# Patient Record
Sex: Female | Born: 1935 | Race: White | Hispanic: No | Smoking: Never smoker
Health system: Southern US, Community
[De-identification: ages and names within clinical notes are randomized; demographics above are authoritative.]

## PROBLEM LIST (undated history)

## (undated) DIAGNOSIS — K219 Gastro-esophageal reflux disease without esophagitis: Secondary | ICD-10-CM

## (undated) DIAGNOSIS — S42202A Unspecified fracture of upper end of left humerus, initial encounter for closed fracture: Secondary | ICD-10-CM

## (undated) DIAGNOSIS — Z860100 Personal history of colon polyps, unspecified: Secondary | ICD-10-CM

## (undated) DIAGNOSIS — G250 Essential tremor: Secondary | ICD-10-CM

## (undated) DIAGNOSIS — F419 Anxiety disorder, unspecified: Secondary | ICD-10-CM

## (undated) DIAGNOSIS — B019 Varicella without complication: Secondary | ICD-10-CM

## (undated) DIAGNOSIS — T7840XA Allergy, unspecified, initial encounter: Secondary | ICD-10-CM

## (undated) DIAGNOSIS — Z8719 Personal history of other diseases of the digestive system: Secondary | ICD-10-CM

## (undated) DIAGNOSIS — M1990 Unspecified osteoarthritis, unspecified site: Secondary | ICD-10-CM

## (undated) HISTORY — DX: Personal history of colonic polyps: Z86.010

## (undated) HISTORY — PX: CATARACT EXTRACTION, BILATERAL: SHX1313

## (undated) HISTORY — DX: Unspecified osteoarthritis, unspecified site: M19.90

## (undated) HISTORY — DX: Anxiety disorder, unspecified: F41.9

## (undated) HISTORY — PX: COLONOSCOPY W/ POLYPECTOMY: SHX1380

## (undated) HISTORY — PX: DILATION AND CURETTAGE OF UTERUS: SHX78

## (undated) HISTORY — PX: OTHER SURGICAL HISTORY: SHX169

## (undated) HISTORY — DX: Varicella without complication: B01.9

## (undated) HISTORY — DX: Allergy, unspecified, initial encounter: T78.40XA

## (undated) HISTORY — PX: JOINT REPLACEMENT: SHX530

## (undated) HISTORY — DX: Personal history of colon polyps, unspecified: Z86.0100

---

## 2000-07-17 HISTORY — PX: OTHER SURGICAL HISTORY: SHX169

## 2001-08-15 ENCOUNTER — Encounter: Payer: Self-pay | Admitting: Internal Medicine

## 2004-01-15 ENCOUNTER — Encounter: Admission: RE | Admit: 2004-01-15 | Discharge: 2004-01-15 | Payer: Self-pay | Admitting: Internal Medicine

## 2004-02-19 ENCOUNTER — Encounter: Payer: Self-pay | Admitting: Internal Medicine

## 2004-06-07 ENCOUNTER — Ambulatory Visit: Payer: Self-pay | Admitting: Internal Medicine

## 2004-09-09 ENCOUNTER — Ambulatory Visit: Payer: Self-pay | Admitting: Internal Medicine

## 2004-11-18 ENCOUNTER — Other Ambulatory Visit: Admission: RE | Admit: 2004-11-18 | Discharge: 2004-11-18 | Payer: Self-pay | Admitting: Neurosurgery

## 2004-11-18 ENCOUNTER — Ambulatory Visit: Payer: Self-pay | Admitting: Internal Medicine

## 2004-11-25 ENCOUNTER — Ambulatory Visit (HOSPITAL_COMMUNITY): Admission: RE | Admit: 2004-11-25 | Discharge: 2004-11-25 | Payer: Self-pay | Admitting: Internal Medicine

## 2005-03-09 ENCOUNTER — Ambulatory Visit: Payer: Self-pay | Admitting: Internal Medicine

## 2005-04-28 ENCOUNTER — Ambulatory Visit: Payer: Self-pay | Admitting: Internal Medicine

## 2006-02-02 ENCOUNTER — Ambulatory Visit: Payer: Self-pay | Admitting: Internal Medicine

## 2006-02-28 ENCOUNTER — Ambulatory Visit: Payer: Self-pay | Admitting: Internal Medicine

## 2006-04-16 ENCOUNTER — Ambulatory Visit: Payer: Self-pay | Admitting: Internal Medicine

## 2006-09-20 ENCOUNTER — Ambulatory Visit: Payer: Self-pay | Admitting: Internal Medicine

## 2006-12-19 ENCOUNTER — Other Ambulatory Visit: Admission: RE | Admit: 2006-12-19 | Discharge: 2006-12-19 | Payer: Self-pay | Admitting: Gynecology

## 2007-02-11 DIAGNOSIS — M1990 Unspecified osteoarthritis, unspecified site: Secondary | ICD-10-CM | POA: Insufficient documentation

## 2007-02-11 DIAGNOSIS — G250 Essential tremor: Secondary | ICD-10-CM

## 2007-02-11 DIAGNOSIS — M19041 Primary osteoarthritis, right hand: Secondary | ICD-10-CM | POA: Insufficient documentation

## 2007-02-13 ENCOUNTER — Ambulatory Visit: Payer: Self-pay | Admitting: Internal Medicine

## 2007-02-13 DIAGNOSIS — F329 Major depressive disorder, single episode, unspecified: Secondary | ICD-10-CM | POA: Insufficient documentation

## 2007-03-08 ENCOUNTER — Ambulatory Visit: Payer: Self-pay | Admitting: Gastroenterology

## 2007-03-20 ENCOUNTER — Encounter: Payer: Self-pay | Admitting: Internal Medicine

## 2007-03-20 ENCOUNTER — Ambulatory Visit: Payer: Self-pay | Admitting: Gastroenterology

## 2007-03-20 ENCOUNTER — Encounter: Payer: Self-pay | Admitting: Gastroenterology

## 2007-04-24 ENCOUNTER — Ambulatory Visit: Payer: Self-pay | Admitting: Gastroenterology

## 2007-04-24 LAB — CONVERTED CEMR LAB
ALT: 62 units/L — ABNORMAL HIGH (ref 0–35)
BUN: 15 mg/dL (ref 6–23)
Basophils Absolute: 0.3 10*3/uL — ABNORMAL HIGH (ref 0.0–0.1)
Basophils Relative: 5 % — ABNORMAL HIGH (ref 0.0–1.0)
CO2: 28 meq/L (ref 19–32)
Calcium: 9.1 mg/dL (ref 8.4–10.5)
Chloride: 110 meq/L (ref 96–112)
GFR calc non Af Amer: 58 mL/min
Glucose, Bld: 103 mg/dL — ABNORMAL HIGH (ref 70–99)
Lipase: 30 units/L (ref 11.0–59.0)
Monocytes Absolute: 0.5 10*3/uL (ref 0.2–0.7)
Neutro Abs: 3.1 10*3/uL (ref 1.4–7.7)
Potassium: 3.9 meq/L (ref 3.5–5.1)
RDW: 11.5 % (ref 11.5–14.6)
Sodium: 143 meq/L (ref 135–145)
WBC: 6.2 10*3/uL (ref 4.5–10.5)

## 2007-05-01 ENCOUNTER — Ambulatory Visit: Payer: Self-pay | Admitting: Cardiology

## 2007-05-15 ENCOUNTER — Ambulatory Visit: Payer: Self-pay | Admitting: Internal Medicine

## 2007-05-15 DIAGNOSIS — Z86010 Personal history of colonic polyps: Secondary | ICD-10-CM

## 2007-05-15 DIAGNOSIS — R945 Abnormal results of liver function studies: Secondary | ICD-10-CM

## 2007-05-15 DIAGNOSIS — K589 Irritable bowel syndrome without diarrhea: Secondary | ICD-10-CM | POA: Insufficient documentation

## 2007-05-15 HISTORY — DX: Abnormal results of liver function studies: R94.5

## 2007-07-26 ENCOUNTER — Telehealth (INDEPENDENT_AMBULATORY_CARE_PROVIDER_SITE_OTHER): Payer: Self-pay | Admitting: *Deleted

## 2007-07-26 DIAGNOSIS — R599 Enlarged lymph nodes, unspecified: Secondary | ICD-10-CM | POA: Insufficient documentation

## 2007-07-30 ENCOUNTER — Telehealth: Payer: Self-pay | Admitting: *Deleted

## 2007-07-30 ENCOUNTER — Ambulatory Visit: Payer: Self-pay | Admitting: Internal Medicine

## 2007-07-30 DIAGNOSIS — T887XXA Unspecified adverse effect of drug or medicament, initial encounter: Secondary | ICD-10-CM

## 2007-07-31 ENCOUNTER — Telehealth: Payer: Self-pay | Admitting: *Deleted

## 2007-08-01 ENCOUNTER — Ambulatory Visit: Payer: Self-pay | Admitting: Internal Medicine

## 2007-08-02 ENCOUNTER — Telehealth: Payer: Self-pay | Admitting: *Deleted

## 2007-08-14 ENCOUNTER — Ambulatory Visit: Payer: Self-pay | Admitting: Internal Medicine

## 2007-08-14 DIAGNOSIS — K7689 Other specified diseases of liver: Secondary | ICD-10-CM

## 2007-08-22 ENCOUNTER — Telehealth (INDEPENDENT_AMBULATORY_CARE_PROVIDER_SITE_OTHER): Payer: Self-pay | Admitting: *Deleted

## 2007-09-26 DIAGNOSIS — F418 Other specified anxiety disorders: Secondary | ICD-10-CM | POA: Insufficient documentation

## 2007-09-26 DIAGNOSIS — K602 Anal fissure, unspecified: Secondary | ICD-10-CM

## 2007-09-26 DIAGNOSIS — K5730 Diverticulosis of large intestine without perforation or abscess without bleeding: Secondary | ICD-10-CM

## 2007-09-26 DIAGNOSIS — F419 Anxiety disorder, unspecified: Secondary | ICD-10-CM | POA: Insufficient documentation

## 2007-09-26 DIAGNOSIS — M129 Arthropathy, unspecified: Secondary | ICD-10-CM

## 2007-09-26 HISTORY — DX: Anal fissure, unspecified: K60.2

## 2007-11-13 ENCOUNTER — Ambulatory Visit: Payer: Self-pay | Admitting: Internal Medicine

## 2007-11-13 LAB — CONVERTED CEMR LAB
Albumin: 3.6 g/dL (ref 3.5–5.2)
Bilirubin, Direct: 0.1 mg/dL (ref 0.0–0.3)
Eosinophils Absolute: 0.1 10*3/uL (ref 0.0–0.7)
MCHC: 34.1 g/dL (ref 30.0–36.0)
MCV: 95.8 fL (ref 78.0–100.0)
Monocytes Absolute: 0.4 10*3/uL (ref 0.1–1.0)
Monocytes Relative: 8.8 % (ref 3.0–12.0)
Neutrophils Relative %: 48.3 % (ref 43.0–77.0)
Platelets: 211 10*3/uL (ref 150–400)
RBC: 4.18 M/uL (ref 3.87–5.11)
RDW: 12.6 % (ref 11.5–14.6)

## 2008-05-12 ENCOUNTER — Ambulatory Visit: Payer: Self-pay | Admitting: Internal Medicine

## 2008-05-12 LAB — CONVERTED CEMR LAB
ALT: 52 units/L — ABNORMAL HIGH (ref 0–35)
AST: 34 units/L (ref 0–37)
Albumin: 4 g/dL (ref 3.5–5.2)
BUN: 23 mg/dL (ref 6–23)
Bilirubin, Direct: 0.1 mg/dL (ref 0.0–0.3)
Calcium: 9.6 mg/dL (ref 8.4–10.5)
Creatinine, Ser: 1.1 mg/dL (ref 0.4–1.2)
GFR calc Af Amer: 63 mL/min
Potassium: 5 meq/L (ref 3.5–5.1)

## 2008-12-31 ENCOUNTER — Emergency Department (HOSPITAL_BASED_OUTPATIENT_CLINIC_OR_DEPARTMENT_OTHER): Admission: EM | Admit: 2008-12-31 | Discharge: 2008-12-31 | Payer: Self-pay | Admitting: Emergency Medicine

## 2009-03-01 ENCOUNTER — Ambulatory Visit: Payer: Self-pay | Admitting: Internal Medicine

## 2009-03-01 DIAGNOSIS — H6060 Unspecified chronic otitis externa, unspecified ear: Secondary | ICD-10-CM

## 2009-03-01 DIAGNOSIS — H608X9 Other otitis externa, unspecified ear: Secondary | ICD-10-CM

## 2009-03-01 HISTORY — DX: Other otitis externa, unspecified ear: H60.8X9

## 2009-03-01 LAB — CONVERTED CEMR LAB
Albumin: 4.2 g/dL (ref 3.5–5.2)
Alkaline Phosphatase: 88 units/L (ref 39–117)
Basophils Absolute: 0 10*3/uL (ref 0.0–0.1)
Basophils Relative: 0.6 % (ref 0.0–3.0)
Eosinophils Absolute: 0.1 10*3/uL (ref 0.0–0.7)
Hemoglobin: 14.8 g/dL (ref 12.0–15.0)
Monocytes Absolute: 0.5 10*3/uL (ref 0.1–1.0)
Platelets: 191 10*3/uL (ref 150.0–400.0)
RBC: 4.38 M/uL (ref 3.87–5.11)
RDW: 12.2 % (ref 11.5–14.6)
Sed Rate: 20 mm/hr (ref 0–22)
Total Protein: 7.8 g/dL (ref 6.0–8.3)

## 2009-06-02 ENCOUNTER — Ambulatory Visit: Payer: Self-pay | Admitting: Internal Medicine

## 2009-06-02 DIAGNOSIS — M542 Cervicalgia: Secondary | ICD-10-CM | POA: Insufficient documentation

## 2009-06-03 ENCOUNTER — Ambulatory Visit: Payer: Self-pay | Admitting: Internal Medicine

## 2010-05-25 ENCOUNTER — Encounter
Admission: RE | Admit: 2010-05-25 | Discharge: 2010-07-12 | Payer: Self-pay | Source: Home / Self Care | Attending: Sports Medicine | Admitting: Sports Medicine

## 2010-07-12 ENCOUNTER — Encounter
Admission: RE | Admit: 2010-07-12 | Discharge: 2010-08-15 | Payer: Self-pay | Source: Home / Self Care | Attending: Sports Medicine | Admitting: Sports Medicine

## 2010-07-20 ENCOUNTER — Ambulatory Visit
Admission: RE | Admit: 2010-07-20 | Discharge: 2010-07-20 | Payer: Self-pay | Source: Home / Self Care | Attending: Internal Medicine | Admitting: Internal Medicine

## 2010-07-20 DIAGNOSIS — IMO0002 Reserved for concepts with insufficient information to code with codable children: Secondary | ICD-10-CM | POA: Insufficient documentation

## 2010-07-20 DIAGNOSIS — M5414 Radiculopathy, thoracic region: Secondary | ICD-10-CM | POA: Insufficient documentation

## 2010-07-23 ENCOUNTER — Encounter
Admission: RE | Admit: 2010-07-23 | Discharge: 2010-07-23 | Payer: Self-pay | Source: Home / Self Care | Attending: Internal Medicine | Admitting: Internal Medicine

## 2010-07-28 ENCOUNTER — Encounter: Payer: Self-pay | Admitting: Internal Medicine

## 2010-08-03 ENCOUNTER — Ambulatory Visit
Admission: RE | Admit: 2010-08-03 | Discharge: 2010-08-03 | Payer: Self-pay | Source: Home / Self Care | Attending: Internal Medicine | Admitting: Internal Medicine

## 2010-08-03 DIAGNOSIS — M1710 Unilateral primary osteoarthritis, unspecified knee: Secondary | ICD-10-CM | POA: Insufficient documentation

## 2010-08-18 NOTE — Assessment & Plan Note (Signed)
Summary: 2 wk rov/njr   Vital Signs:  Patient profile:   77 year old female Height:      64 inches Weight:      164 pounds BMI:     28.25 Temp:     98.2 degrees F oral Pulse rate:   72 / minute Resp:     14 per minute BP sitting:   120 / 70  (left arm)  Vitals Entered By: Willy Eddy, LPN (August 03, 2010 4:04 PM) CC: roa- discuss mri Is Patient Diabetic? No   Primary Care Provider:  Stacie Glaze MD  CC:  roa- discuss mri.  History of Present Illness: severe back pain with numbness in thighs and bilateral knee pain cannot get up withpout assistance after sitting pain in knees evaleted by ortho and failed synvisc... need to TKR evident  Preventive Screening-Counseling & Management  Alcohol-Tobacco     Smoking Status: never     Tobacco Counseling: not indicated; no tobacco use  Current Medications (verified): 1)  Nexium 40 Mg Cpdr (Esomeprazole Magnesium) .... Once Daily 2)  Valium 5 Mg Tabs (Diazepam) .... One Twice A Day 3)  Wellbutrin Xl 300 Mg Xr24h-Tab (Bupropion Hcl) .... One By Mouth Daily 4)  Voltaren 1 % Gel (Diclofenac Sodium) .... Apply As Directed 5)  Naprosyn 500 Mg Tabs (Naproxen) .... Two Times A Day 6)  Ropinirole Hcl 0.5 Mg Tabs (Ropinirole Hcl) .... One Half For 1 Week The Increased The One  Allergies (verified): 1)  Sulfamethoxazole (Sulfamethoxazole)  Past History:  Family History: Last updated: 02/01/2007 Fam hx Renal dz  Social History: Last updated: 02/13/2007 Never Smoked Alcohol use-yes Drug use-no Married  Risk Factors: Smoking Status: never (08/03/2010)  Past medical, surgical, family and social histories (including risk factors) reviewed, and no changes noted (except as noted below).  Past Medical History: Reviewed history from 09/26/2007 and no changes required. B.E.T. Osteoarthritis Colonic polyps, hx of Current Problems:  ANAL FISSURE (ICD-565.0) DIVERTICULOSIS, COLON (ICD-562.10) ANXIETY  (ICD-300.00) ARTHRITIS (ICD-716.90) FATTY LIVER DISEASE (ICD-571.8) UNS ADVRS EFF UNS RX MEDICINAL&BIOLOGICAL SBSTNC (ICD-995.20) ENLARGEMENT OF LYMPH NODES (ICD-785.6) ABNORMAL RESULT, FUNCTION STUDY, LIVER (ICD-794.8) COLONIC POLYPS, HX OF (ICD-V12.72) IRRITABLE BOWEL SYNDROME (ICD-564.1) DISORDER, DEPRESSIVE NEC (ICD-311) PREVENTIVE HEALTH CARE (ICD-V70.0) TREMOR, ESSENTIAL (ICD-333.1) OSTEOARTHRITIS (ICD-715.90)  Past Surgical History: Reviewed history from 07/20/2010 and no changes required. Bmp- 2002 D&C Colon polypectomy arthroscopy left knee  Family History: Reviewed history from 02/01/2007 and no changes required. Fam hx Renal dz  Social History: Reviewed history from 02/13/2007 and no changes required. Never Smoked Alcohol use-yes Drug use-no Married  Review of Systems       The patient complains of difficulty walking.  The patient denies anorexia, fever, weight loss, weight gain, vision loss, decreased hearing, hoarseness, chest pain, syncope, dyspnea on exertion, peripheral edema, prolonged cough, headaches, hemoptysis, abdominal pain, melena, hematochezia, severe indigestion/heartburn, hematuria, incontinence, genital sores, muscle weakness, suspicious skin lesions, transient blindness, depression, unusual weight change, abnormal bleeding, enlarged lymph nodes, angioedema, and breast masses.    Physical Exam  General:  average weight.   Head:  Normocephalic and atraumatic without obvious abnormalities. No apparent alopecia or balding. Eyes:  pupils equal and pupils round.   Ears:  R ear normal and L ear normal.   Nose:  no nasal discharge.  no external deformity.   Neck:  supple, full ROM, and L neck muslce spasm Lungs:  normal respiratory effort and no wheezes.     Knee Exam  Gait:  limp noted-right and limp noted-left.    Skin:    Intact, no scars, lesions, rashes, cafe au lait spots, or bruising.    Inspection:     No deformity, ecchymosis or  swelling.   Palpation:    tenderness R-medial joint line, tenderness R-lateral joint line, tenderness R-Parapatellar, tenderness L-medial joint line, tenderness L-lateral joint line, and tenderness L-Parapatellar.     Impression & Recommendations:  Problem # 1:  BACK PAIN, LUMBAR, WITH RADICULOPATHY (ICD-724.4) severe L4-5 disc dz  with severe facet  artrtis and has been referr to Dr Ethelene Hal for  injection Her updated medication list for this problem includes:    Naprosyn 500 Mg Tabs (Naproxen) .Marland Kitchen..Marland Kitchen Two times a day  Problem # 2:  LOC OSTEOARTHROS NOT SPEC PRIM/SEC LOWER LEG (ICD-715.36)  trial of bilateral knee joint injections if good immediate relief will delay referral to allusio... if knee injection fails then refer immediately to [facility] Her updated medication list for this problem includes:    Naprosyn 500 Mg Tabs (Naproxen) .Marland Kitchen..Marland Kitchen Two times a day Informed consent obtained and then both knee joints were prepped in a sterile manor and 40 mg depo and 1/2 cc 1% lidocaine injected into the synovial space. After care discussed. Pt tolerated procedure well. Both rifght and left knees   Discussed use of medications, application of heat or cold, and exercises.   Orders: Depo- Medrol 40mg  (J1030) Joint Aspirate / Injection, Large (20610)  Complete Medication List: 1)  Nexium 40 Mg Cpdr (Esomeprazole magnesium) .... Once daily 2)  Valium 5 Mg Tabs (Diazepam) .... One twice a day 3)  Wellbutrin Xl 300 Mg Xr24h-tab (Bupropion hcl) .... One by mouth daily 4)  Voltaren 1 % Gel (Diclofenac sodium) .... Apply as directed 5)  Naprosyn 500 Mg Tabs (Naproxen) .... Two times a day 6)  Ropinirole Hcl 0.5 Mg Tabs (Ropinirole hcl) .... One half for 1 week the increased the one  Patient Instructions: 1)  Please schedule a follow-up appointment in 2 months.   Orders Added: 1)  Est. Patient Level III [16109] 2)  Depo- Medrol 40mg  [J1030] 3)  Joint Aspirate / Injection, Large [20610]

## 2010-08-18 NOTE — Assessment & Plan Note (Signed)
Summary: pain in legs/pt having diff walking/cjr   Vital Signs:  Patient profile:   77 year old female Height:      64 inches Weight:      164 pounds BMI:     28.25 Temp:     98.3 degrees F oral Pulse rate:   76 / minute Resp:     14 per minute BP sitting:   144 / 70  (left arm)  Vitals Entered By: Willy Eddy, LPN (July 20, 2010 3:59 PM) CC: c/o knee pain and wento ortho and was dx with arthritis and was given synvic injections--now x/o pain from hip down to ankles- also c/o tremors geting worse Is Patient Diabetic? No   Primary Care Provider:  Stacie Glaze MD  CC:  c/o knee pain and wento ortho and was dx with arthritis and was given synvic injections--now x/o pain from hip down to ankles- also c/o tremors geting worse.  History of Present Illness: pain in both leg and hips, she had shots in knee and hips has a tens unit for knee one day she stood up and she felt her legs go out from under her her BET ( presumptive diagnosis) has worsened with increased tremor her husband passed recently and she has been tearfull  Preventive Screening-Counseling & Management  Alcohol-Tobacco     Smoking Status: never     Tobacco Counseling: not indicated; no tobacco use  Problems Prior to Update: 1)  Back Pain, Lumbar, With Radiculopathy  (ICD-724.4) 2)  Cervicalgia  (ICD-723.1) 3)  Other Chronic Otitis Externa  (ICD-380.23) 4)  Anal Fissure  (ICD-565.0) 5)  Diverticulosis, Colon  (ICD-562.10) 6)  Anxiety  (ICD-300.00) 7)  Arthritis  (ICD-716.90) 8)  Fatty Liver Disease  (ICD-571.8) 9)  Uns Advrs Eff Uns Rx Medicinal&biological Sbstnc  (ICD-995.20) 10)  Enlargement of Lymph Nodes  (ICD-785.6) 11)  Abnormal Result, Function Study, Liver  (ICD-794.8) 12)  Colonic Polyps, Hx of  (ICD-V12.72) 13)  Irritable Bowel Syndrome  (ICD-564.1) 14)  Disorder, Depressive Nec  (ICD-311) 15)  Preventive Health Care  (ICD-V70.0) 16)  Tremor, Essential  (ICD-333.1) 17)  Osteoarthritis   (ICD-715.90)  Current Problems (verified): 1)  Cervicalgia  (ICD-723.1) 2)  Other Chronic Otitis Externa  (ICD-380.23) 3)  Anal Fissure  (ICD-565.0) 4)  Diverticulosis, Colon  (ICD-562.10) 5)  Anxiety  (ICD-300.00) 6)  Arthritis  (ICD-716.90) 7)  Fatty Liver Disease  (ICD-571.8) 8)  Uns Advrs Eff Uns Rx Medicinal&biological Sbstnc  (ICD-995.20) 9)  Enlargement of Lymph Nodes  (ICD-785.6) 10)  Abnormal Result, Function Study, Liver  (ICD-794.8) 11)  Colonic Polyps, Hx of  (ICD-V12.72) 12)  Irritable Bowel Syndrome  (ICD-564.1) 13)  Disorder, Depressive Nec  (ICD-311) 14)  Preventive Health Care  (ICD-V70.0) 15)  Tremor, Essential  (ICD-333.1) 16)  Osteoarthritis  (ICD-715.90)  Medications Prior to Update: 1)  Nexium 40 Mg Cpdr (Esomeprazole Magnesium) .... Once Daily 2)  Valium 5 Mg Tabs (Diazepam) .... One Twice A Day 3)  Wellbutrin Sr 150 Mg  Tb12 (Bupropion Hcl) .... One By Mouth Q Am 4)  Mobic 15 Mg  Tabs (Meloxicam) .... One By Mouth Daily 5)  Hyomax-Sl 0.125 Mg  Subl (Hyoscyamine Sulfate) .Marland Kitchen.. 1 4times A Day As Needed  Current Medications (verified): 1)  Nexium 40 Mg Cpdr (Esomeprazole Magnesium) .... Once Daily 2)  Valium 5 Mg Tabs (Diazepam) .... One Twice A Day 3)  Wellbutrin Xl 300 Mg Xr24h-Tab (Bupropion Hcl) .... One By Mouth Daily  4)  Voltaren 1 % Gel (Diclofenac Sodium) .... Apply As Directed 5)  Naprosyn 500 Mg Tabs (Naproxen) .... Two Times A Day 6)  Ropinirole Hcl 0.5 Mg Tabs (Ropinirole Hcl) .... One Half For 1 Week The Increased The One  Allergies (verified): 1)  Sulfamethoxazole (Sulfamethoxazole)  Past History:  Family History: Last updated: 02/01/2007 Fam hx Renal dz  Social History: Last updated: 02/13/2007 Never Smoked Alcohol use-yes Drug use-no Married  Risk Factors: Smoking Status: never (07/20/2010)  Past medical, surgical, family and social histories (including risk factors) reviewed, and no changes noted (except as noted  below).  Past Medical History: Reviewed history from 09/26/2007 and no changes required. B.E.T. Osteoarthritis Colonic polyps, hx of Current Problems:  ANAL FISSURE (ICD-565.0) DIVERTICULOSIS, COLON (ICD-562.10) ANXIETY (ICD-300.00) ARTHRITIS (ICD-716.90) FATTY LIVER DISEASE (ICD-571.8) UNS ADVRS EFF UNS RX MEDICINAL&BIOLOGICAL SBSTNC (ICD-995.20) ENLARGEMENT OF LYMPH NODES (ICD-785.6) ABNORMAL RESULT, FUNCTION STUDY, LIVER (ICD-794.8) COLONIC POLYPS, HX OF (ICD-V12.72) IRRITABLE BOWEL SYNDROME (ICD-564.1) DISORDER, DEPRESSIVE NEC (ICD-311) PREVENTIVE HEALTH CARE (ICD-V70.0) TREMOR, ESSENTIAL (ICD-333.1) OSTEOARTHRITIS (ICD-715.90)  Past Surgical History: Bmp- 2002 D&C Colon polypectomy arthroscopy left knee  Family History: Reviewed history from 02/01/2007 and no changes required. Fam hx Renal dz  Social History: Reviewed history from 02/13/2007 and no changes required. Never Smoked Alcohol use-yes Drug use-no Married  Review of Systems       The patient complains of weight gain, difficulty walking, and depression.  The patient denies anorexia, fever, weight loss, vision loss, decreased hearing, hoarseness, chest pain, syncope, dyspnea on exertion, peripheral edema, prolonged cough, headaches, hemoptysis, abdominal pain, melena, hematochezia, severe indigestion/heartburn, hematuria, incontinence, genital sores, muscle weakness, suspicious skin lesions, transient blindness, unusual weight change, abnormal bleeding, enlarged lymph nodes, angioedema, and breast masses.    Physical Exam  General:  Well-developed,well-nourished,in no acute distress; alert,appropriate and cooperative throughout examination Head:  Normocephalic and atraumatic without obvious abnormalities. No apparent alopecia or balding. Eyes:  pupils equal and pupils round.   Ears:  R ear normal and L ear normal.   Neck:  supple, full ROM, and L neck muslce spasm Lungs:  normal respiratory effort and no  wheezes.   Heart:  normal rate and regular rhythm.   Abdomen:  soft and normal bowel sounds.   Extremities:  trace left pedal edema and trace right pedal edema.   Neurologic:  alert & oriented X3 and abnormal gait.     Knee Exam  General:    average weight.    Gait:    limp noted-right.    Skin:    Intact, no scars, lesions, rashes, cafe au lait spots, or bruising.    Inspection:    swelling:   Palpation:    tenderness R-medial joint line and tenderness L-medial joint line.     Impression & Recommendations:  Problem # 1:  TREMOR, ESSENTIAL (ICD-333.1) Assessment Unchanged has seen love in the past he tried topamax has not been on ropinrole .5 mg for restless leg and tremor  Problem # 2:  BACK PAIN, LUMBAR, WITH RADICULOPATHY (ICD-724.4) Assessment: Deteriorated  the pt has been to the orthopedist for knee pain and has injections bu the pain and the gate issues would suggest more lumbar radiculopathy and posible spoinal stenosis needs mri of LS spine The following medications were removed from the medication list:    Mobic 15 Mg Tabs (Meloxicam) ..... One by mouth daily Her updated medication list for this problem includes:    Naprosyn 500 Mg Tabs (Naproxen) .Marland Kitchen..Marland Kitchen Two times  a day  Orders: Radiology Referral (Radiology)  Problem # 3:  ANXIETY (ICD-300.00) Assessment: Unchanged  Her updated medication list for this problem includes:    Valium 5 Mg Tabs (Diazepam) ..... One twice a day    Wellbutrin Xl 300 Mg Xr24h-tab (Bupropion hcl) ..... One by mouth daily  Discussed medication use and relaxation techniques.   Complete Medication List: 1)  Nexium 40 Mg Cpdr (Esomeprazole magnesium) .... Once daily 2)  Valium 5 Mg Tabs (Diazepam) .... One twice a day 3)  Wellbutrin Xl 300 Mg Xr24h-tab (Bupropion hcl) .... One by mouth daily 4)  Voltaren 1 % Gel (Diclofenac sodium) .... Apply as directed 5)  Naprosyn 500 Mg Tabs (Naproxen) .... Two times a day 6)  Ropinirole  Hcl 0.5 Mg Tabs (Ropinirole hcl) .... One half for 1 week the increased the one  Patient Instructions: 1)  Please schedule a follow-up appointment in 2 weeks.  may use sda or add on at 4 Prescriptions: VALIUM 5 MG TABS (DIAZEPAM) ONE TWICE A DAY  #60 x 3   Entered and Authorized by:   Stacie Glaze MD   Signed by:   Stacie Glaze MD on 07/20/2010   Method used:   Print then Give to Patient   RxID:   [id number] ROPINIROLE HCL 0.5 MG TABS (ROPINIROLE HCL) one half for 1 week the increased the one  #30 x 2   Entered and Authorized by:   Stacie Glaze MD   Signed by:   Stacie Glaze MD on 07/20/2010   Method used:   Electronically to        [facility]  [address] [id number]* (retail)       [address]       [place], [place]  [address]       Ph: [phone] or [phone]       Fax: [phone]   RxID:   [id number] WELLBUTRIN XL 300 MG XR24H-TAB (BUPROPION HCL) one by mouth daily  #30 x 2   Entered and Authorized by:   Stacie Glaze MD   Signed by:   Stacie Glaze MD on 07/20/2010   Method used:   Electronically to        [facility]  [address] [id number]* (retail)       [address]       [place], [place]  [address]       Ph: [phone] or [phone]       Fax: [phone]   RxID:   [id number]    Orders Added: 1)  Radiology Referral [Radiology] 2)  Est. Patient Level IV [62831]

## 2010-08-18 NOTE — Miscellaneous (Signed)
Summary: Orders Update   Clinical Lists Changes  Problems: Added new problem of ARTHRITIS, ACUTE (ICD-716.90) Orders: Added new Referral order of Pain Clinic Referral (Pain) - Signed

## 2010-09-11 ENCOUNTER — Other Ambulatory Visit: Payer: Self-pay | Admitting: Internal Medicine

## 2010-10-07 ENCOUNTER — Encounter: Payer: Self-pay | Admitting: Internal Medicine

## 2010-10-10 ENCOUNTER — Ambulatory Visit: Payer: Medicare Other | Admitting: Internal Medicine

## 2010-10-10 ENCOUNTER — Encounter: Payer: Self-pay | Admitting: Internal Medicine

## 2010-10-10 VITALS — BP 134/80 | HR 72 | Temp 98.2°F | Resp 16 | Wt 156.0 lb

## 2010-10-10 DIAGNOSIS — M1710 Unilateral primary osteoarthritis, unspecified knee: Secondary | ICD-10-CM

## 2010-10-10 DIAGNOSIS — F329 Major depressive disorder, single episode, unspecified: Secondary | ICD-10-CM

## 2010-10-10 DIAGNOSIS — G250 Essential tremor: Secondary | ICD-10-CM

## 2010-10-10 DIAGNOSIS — G252 Other specified forms of tremor: Secondary | ICD-10-CM

## 2010-10-10 MED ORDER — CARBAMAZEPINE ER 100 MG PO TB12: 100.0000 mg | ORAL_TABLET | Freq: Two times a day (BID) | ORAL | Status: DC

## 2010-10-10 MED ORDER — HYOSCYAMINE SULFATE 0.125 MG SL SUBL: 0.1250 mg | SUBLINGUAL_TABLET | SUBLINGUAL | Status: DC | PRN

## 2010-10-10 MED ORDER — DESVENLAFAXINE SUCCINATE ER 50 MG PO TB24: 50.0000 mg | ORAL_TABLET | Freq: Every day | ORAL | Status: DC

## 2010-10-10 NOTE — Assessment & Plan Note (Signed)
falling welbutrin and requesting help Will change to pristiq.

## 2010-10-10 NOTE — Patient Instructions (Signed)
Take the Tegretol extended release one a day for the first week then take it twice a day as directed on the bottle

## 2010-10-10 NOTE — Assessment & Plan Note (Signed)
Has an appointment with Dr. Ethlyn Gallery this week for her knees she is probably a candidate for knee replacement

## 2010-10-10 NOTE — Assessment & Plan Note (Signed)
The patient has severe essential tremor NST and Dr. Sandria Manly in the past we have tried Valium unsuccessfully to control her tremor but she is still taking it we added roperinol and this actually cause increased tremor.   we will try one more intervention with Tegretol prior to her referral 2 the neurologist at [facility] if we cannot help her control her tremor

## 2010-11-08 ENCOUNTER — Ambulatory Visit: Payer: Medicare Other | Admitting: Internal Medicine

## 2010-11-12 ENCOUNTER — Other Ambulatory Visit: Payer: Self-pay | Admitting: Internal Medicine

## 2010-11-29 NOTE — Assessment & Plan Note (Signed)
[facility]                         GASTROENTEROLOGY OFFICE NOTE   WYNETTE, Mora                     MRN:          [id number]  DATE:04/24/2007                            DOB:          1934/06/11    REFERRING PHYSICIAN:  Stacie Glaze, MD   REASON FOR CONSULTATION:  Lower abdominal pain, diarrhea, gas and  bloating.   HISTORY OF PRESENT ILLNESS:  [name] is a 77 year old white female  whom I recently saw for a colonoscopy.  She had a history of diarrhea,  constipation and occasional fecal incontinence.  A colonoscopy revealed  diverticulosis, a small anal fissure and hyperplastic colon polyps.  She  relates on and off crampy lower abdominal pain, associated with left  abdomen and left chest gas pains.  More recently she has had  difficulties with diarrhea, but not constipation.  She notes no rectal  bleeding.  She has a history of reflux symptoms and her symptoms are  well-controlled on daily Nexium.  She notes no dysphagia, odynophagia or  weight loss.  There is no family history of colon cancer, colon polyps  or inflammatory bowel disease.   PAST MEDICAL HISTORY:  1. Arthritis.  2. Anxiety.  3. Depression.  4. Diverticulosis.  5. Osteoporosis.  6. Anal fissure.  7. Irritable bowel syndrome.   CURRENT MEDICATIONS:  Listed on the chart, are updated and reviewed.   ALLERGIES:  SULFA DRUGS AND CODEINE.   SOCIAL HISTORY/REVIEW OF SYSTEMS:  Per the handwritten form.   PHYSICAL EXAMINATION:  GENERAL:  In no acute distress.  VITAL SIGNS:  Height 5 feet 4-1/2 inches, weight 178.2 pounds, blood  pressure 102/64, pulse 72 and regular.  HEENT:  Anicteric sclerae.  Oropharynx clear.  CHEST:  Clear to auscultation bilaterally.  HEART:  A regular rate and rhythm without murmurs.  ABDOMEN:  Soft, with minimal lower abdominal tenderness to deep  palpation.  No rebound or guarding.  No palpable organomegaly, masses or  herniae.   Normoactive bowel sounds.  EXTREMITIES:  Without clubbing, cyanosis or edema.  NEUROLOGIC:  Alert and oriented x3.  Grossly nonfocal.   IMPRESSION/PLAN:  1. Suspected irritable bowel syndrome, rule out other abdominal and      pelvic pathology:  Obtain a CBC, CMET, lipase and TSH today.      Schedule a CT scan of the abdomen and pelvis.  Maintain a high      fiber diet with increased fluids.  Begin hyoscyamine two      sublingually or orally four times daily p.r.n.  Return office visit      in three to four weeks.  2. Gastroesophageal reflux disease:  Continue standard anti-reflux      measures and Nexium 40 mg q.a.m.     [name]. Krista Dar, MD, [facility]  Electronically Signed    MTS/MedQ  DD: 05/06/2007  DT: 05/07/2007  Job #: [id number]   cc:   Stacie Glaze, MD

## 2011-02-11 ENCOUNTER — Other Ambulatory Visit: Payer: Self-pay | Admitting: Internal Medicine

## 2011-03-01 ENCOUNTER — Ambulatory Visit (HOSPITAL_COMMUNITY)
Admission: RE | Admit: 2011-03-01 | Discharge: 2011-03-01 | Disposition: A | Discharge status: Home / Self Care | Payer: Medicare Other | Source: Ambulatory Visit | Attending: Orthopedic Surgery | Admitting: Orthopedic Surgery

## 2011-03-01 ENCOUNTER — Ambulatory Visit (HOSPITAL_BASED_OUTPATIENT_CLINIC_OR_DEPARTMENT_OTHER)
Admission: RE | Admit: 2011-03-01 | Discharge: 2011-03-01 | Disposition: A | Discharge status: Home / Self Care | Payer: Medicare Other | Source: Ambulatory Visit | Attending: Orthopedic Surgery | Admitting: Orthopedic Surgery

## 2011-03-01 DIAGNOSIS — G252 Other specified forms of tremor: Secondary | ICD-10-CM | POA: Insufficient documentation

## 2011-03-01 DIAGNOSIS — Z01810 Encounter for preprocedural cardiovascular examination: Secondary | ICD-10-CM | POA: Insufficient documentation

## 2011-03-01 DIAGNOSIS — G250 Essential tremor: Secondary | ICD-10-CM | POA: Insufficient documentation

## 2011-03-01 DIAGNOSIS — Z01818 Encounter for other preprocedural examination: Secondary | ICD-10-CM | POA: Insufficient documentation

## 2011-03-01 DIAGNOSIS — K219 Gastro-esophageal reflux disease without esophagitis: Secondary | ICD-10-CM | POA: Insufficient documentation

## 2011-03-01 DIAGNOSIS — X58XXXA Exposure to other specified factors, initial encounter: Secondary | ICD-10-CM | POA: Insufficient documentation

## 2011-03-01 DIAGNOSIS — Z01812 Encounter for preprocedural laboratory examination: Secondary | ICD-10-CM | POA: Insufficient documentation

## 2011-03-01 DIAGNOSIS — IMO0002 Reserved for concepts with insufficient information to code with codable children: Secondary | ICD-10-CM | POA: Insufficient documentation

## 2011-03-01 DIAGNOSIS — K589 Irritable bowel syndrome without diarrhea: Secondary | ICD-10-CM | POA: Insufficient documentation

## 2011-03-06 NOTE — Op Note (Addendum)
[name], [name]              ACCOUNT NO.:  [id number]  MEDICAL RECORD NO.:  [id number]  LOCATION:                               FACILITY:  [facility]  PHYSICIAN:  Ollen Gross, M.D.    DATE OF BIRTH:  1933/12/12  DATE OF PROCEDURE:  03/01/2011 DATE OF DISCHARGE:  03/01/2011                              OPERATIVE REPORT   PREOPERATIVE DIAGNOSIS:  Right knee medial meniscal tear.  POSTOPERATIVE DIAGNOSIS.:  Right knee medial meniscal tear.  PROCEDURE:  Right knee arthroscopy with meniscal debridement.  SURGEON:  Ollen Gross, M.D.  ASSISTANT:  None.  ANESTHESIA:  General.  ESTIMATED BLOOD LOSS:  Minimal.  DRAIN:  None.  COMPLICATIONS:  None.  CONDITION:  This patient is stable in the recovery room.  BRIEF CLINICAL NOTE:  [name] is a 77 year old female with several month history of significantly worsening pain and mechanical symptoms in the right knee.  She initially has stress reaction of femoral condyle per MRI, but then the severe pain got better, but she had persistent mechanical symptoms and medial sided pain.  Exam and history suggested a medial meniscal tear, which was confirmed on a MRI.  Given that she has not responded to long-term nonoperative management.  She presents now for arthroscopic evaluation and debridement.  PROCEDURE IN DETAIL:  After successful administration of general anesthetic, a tourniquet was placed on her right thigh and the right lower extremity was prepped and draped in the usual sterile fashion. Standard superomedial inferolateral incisions were made, and flow cannula passed, superomedial camera passed inferolateral.  Arthroscopic visualization proceeds.  Undersurface of patella and trochlea showed mild chondromalacia, but no unstable cartilage defects.  Mediolateral gutters were visualized, there were no loose bodies.  Flexion valgus force was applied to the knee in the medial compartment center.  There was significant tear at  the body and posterior horn of the medial meniscus.  There was also an area of exposed bone about 2 cm x 2 cm on the medial femoral condyle at the medial most edge of the joint and about 1 cm x 1 cm in the medial tibial plateau at the medial most edge of the joint.  The cartilage otherwise showed mild thinning, but was normal.  The spinal needle was used to localize the inferomedial portal. A small incision made and dilator was placed.  Meniscus debrided back to a stable base with baskets and a 4.2-mm shaver, and then sealed off with the ArthroCare device.  Intercondylar notch was visualized.  The ACL was normal.  Lateral compartment centered and it looks normal.  The joints were again inspected.  No other tears, defects, or loose bodies noted. Arthroscopic equipment was then removed from the inferior portals, which were closed with interrupted 4-0 nylon.  20 cc of 0.25% Marcaine with epinephrine injected through the inflow cannula, and then that was removed and that portal closed with nylon.  A bulky sterile dressing was then applied, and she was awakened and transported to recovery in stable condition.     Ollen Gross, M.D.     FA/MEDQ  D:  03/01/2011  T:  03/02/2011  Job:  [id number]  Electronically Signed by Ollen Gross  M.D. on 03/06/2011 04:38:17 PM

## 2011-03-13 ENCOUNTER — Other Ambulatory Visit: Payer: Self-pay | Admitting: Internal Medicine

## 2011-08-05 ENCOUNTER — Other Ambulatory Visit: Payer: Self-pay | Admitting: Orthopedic Surgery

## 2011-08-15 ENCOUNTER — Other Ambulatory Visit: Payer: Self-pay | Admitting: Orthopedic Surgery

## 2011-10-03 ENCOUNTER — Other Ambulatory Visit: Payer: Self-pay | Admitting: Gynecology

## 2011-10-03 DIAGNOSIS — R928 Other abnormal and inconclusive findings on diagnostic imaging of breast: Secondary | ICD-10-CM

## 2011-10-06 ENCOUNTER — Ambulatory Visit
Admission: RE | Admit: 2011-10-06 | Discharge: 2011-10-06 | Disposition: A | Discharge status: Home / Self Care | Payer: Medicare Other | Source: Ambulatory Visit | Attending: Gynecology | Admitting: Gynecology

## 2011-10-06 DIAGNOSIS — R928 Other abnormal and inconclusive findings on diagnostic imaging of breast: Secondary | ICD-10-CM

## 2011-10-24 ENCOUNTER — Other Ambulatory Visit: Payer: Self-pay | Admitting: Orthopedic Surgery

## 2011-10-28 ENCOUNTER — Emergency Department (HOSPITAL_BASED_OUTPATIENT_CLINIC_OR_DEPARTMENT_OTHER)
Admission: EM | Admit: 2011-10-28 | Discharge: 2011-10-29 | Disposition: A | Discharge status: Home / Self Care | Payer: Medicare Other | Attending: Emergency Medicine | Admitting: Emergency Medicine

## 2011-10-28 ENCOUNTER — Emergency Department (INDEPENDENT_AMBULATORY_CARE_PROVIDER_SITE_OTHER): Payer: Medicare Other

## 2011-10-28 ENCOUNTER — Encounter (HOSPITAL_BASED_OUTPATIENT_CLINIC_OR_DEPARTMENT_OTHER): Payer: Self-pay | Admitting: *Deleted

## 2011-10-28 DIAGNOSIS — S42293A Other displaced fracture of upper end of unspecified humerus, initial encounter for closed fracture: Secondary | ICD-10-CM | POA: Insufficient documentation

## 2011-10-28 DIAGNOSIS — S42209A Unspecified fracture of upper end of unspecified humerus, initial encounter for closed fracture: Secondary | ICD-10-CM

## 2011-10-28 DIAGNOSIS — S42253A Displaced fracture of greater tuberosity of unspecified humerus, initial encounter for closed fracture: Secondary | ICD-10-CM

## 2011-10-28 DIAGNOSIS — W19XXXA Unspecified fall, initial encounter: Secondary | ICD-10-CM

## 2011-10-28 DIAGNOSIS — M25559 Pain in unspecified hip: Secondary | ICD-10-CM | POA: Insufficient documentation

## 2011-10-28 DIAGNOSIS — M79609 Pain in unspecified limb: Secondary | ICD-10-CM | POA: Insufficient documentation

## 2011-10-28 DIAGNOSIS — S42213A Unspecified displaced fracture of surgical neck of unspecified humerus, initial encounter for closed fracture: Secondary | ICD-10-CM

## 2011-10-28 DIAGNOSIS — S42202A Unspecified fracture of upper end of left humerus, initial encounter for closed fracture: Secondary | ICD-10-CM

## 2011-10-28 DIAGNOSIS — W010XXA Fall on same level from slipping, tripping and stumbling without subsequent striking against object, initial encounter: Secondary | ICD-10-CM | POA: Insufficient documentation

## 2011-10-28 HISTORY — DX: Unspecified fracture of upper end of left humerus, initial encounter for closed fracture: S42.202A

## 2011-10-28 MED ORDER — OXYCODONE-ACETAMINOPHEN 5-325 MG PO TABS: 1.0000 | ORAL_TABLET | ORAL | Status: AC | PRN

## 2011-10-28 MED ORDER — OXYCODONE-ACETAMINOPHEN 5-325 MG PO TABS
1.0000 | ORAL_TABLET | Freq: Once | ORAL | Status: AC
  Administered 2011-10-29: 1 via ORAL
  Filled 2011-10-28: qty 1

## 2011-10-28 MED ORDER — MORPHINE SULFATE 4 MG/ML IJ SOLN
4.0000 mg | Freq: Once | INTRAMUSCULAR | Status: AC
  Administered 2011-10-29: 4 mg via INTRAVENOUS
  Filled 2011-10-28: qty 1

## 2011-10-28 NOTE — ED Notes (Signed)
Pt presents to ED today with injury to left shoulder/upper arm after falling while walking dog.  Pt reports landing on concrete striking arm then elbow then hip.  Pt has full ROM in leg and only reports some stiffness.  Pt was given of Fentanyl enroute to facility.  Pt currently still c/o 10/10 on scale.  Pt has no obvious deformities or swelling noted.

## 2011-10-28 NOTE — ED Provider Notes (Signed)
History   This chart was scribed for Nat Christen, MD by Charolett Bumpers . The patient was seen in room MH12/MH12 and the patient's care was started at 11:26pm.    CSN: [id number]  Arrival date & time 10/28/11  2234   First MD Initiated Contact with Patient 10/28/11 2300      Chief Complaint  Patient presents with  . Arm Injury  . Fall    (Consider location/radiation/quality/duration/timing/severity/associated sxs/prior treatment) HPI [name] is a 78 y.o. female who presents to the Emergency Department complaining of constant, moderate right upper arm pain after a fall that occurred PTA. Patient states that she tripped over a dog and fell on to the concrete floor. Patient states that she fell on left arm and left hip. Patient states that her left arm is her main concern. Patient originally reports her pain in her left arm was a 10/10 per nurse's report. Patient was given 100 mcg of Fentanyl en route by EMS. Patient states that she was unable to move her fingers immediately after fall. Patient states that she was able to walk after the fall. Patient states that her left upper arm pain radiates down her arm to her elbow. Patient denies any current nausea. Patient reports a h/o tremors and arthritis.    Orthopedic Specialist: Dr. Antony Odea   Past Medical History  Diagnosis Date  . Osteoarthritis   . Hx of colonic polyps     Past Surgical History  Procedure Date  . Bmp 2002  . Dilation and curettage of uterus   . Colonoscopy w/ polypectomy   . Arthroscopy left knee     Family History  Problem Relation Age of Onset  . Kidney failure      renal disease    History  Substance Use Topics  . Smoking status: Never Smoker   . Smokeless tobacco: Not on file  . Alcohol Use: Yes    OB History    Grav Para Term Preterm Abortions TAB SAB Ect Mult Living                  Review of Systems  Constitutional: Negative.   HENT: Negative.   Eyes: Negative.   Negative for discharge.  Respiratory: Negative.  Negative for cough and shortness of breath.   Cardiovascular: Negative.  Negative for chest pain.  Gastrointestinal: Negative.  Negative for nausea, vomiting, abdominal pain and diarrhea.  Genitourinary: Negative.   Musculoskeletal: Negative for back pain.  Skin: Negative.  Negative for color change and rash.  Neurological: Negative.  Negative for syncope and headaches.  Hematological: Negative.   Psychiatric/Behavioral: Negative.  Negative for confusion.  All other systems reviewed and are negative.   A complete 10 system review of systems was obtained and all systems are negative except as noted in the HPI and PMH.   Allergies  Sulfamethoxazole  Home Medications   Current Outpatient Rx  Name Route Sig Dispense Refill  . DIAZEPAM 5 MG PO TABS  take 1 tablet by mouth twice a day 60 tablet 2  . CENTRUM SILVER ULTRA WOMENS PO Oral Take 1 tablet by mouth daily.    Marland Kitchen NAPROXEN SODIUM 220 MG PO TABS Oral Take 220 mg by mouth 2 (two) times daily with a meal. Patient uses this medication for her knee pain.    Marland Kitchen NEXIUM 40 MG PO CPDR  TAKE 1 CAPSULE BY MOUTH ONCE DAILY 30 capsule 5  . PRIMIDONE 50 MG PO TABS Oral Take  50 mg by mouth 4 (four) times daily.    Marland Kitchen CARBAMAZEPINE ER 100 MG PO TB12 Oral Take 1 tablet (100 mg total) by mouth 2 (two) times daily. 60 tablet 2  . DESVENLAFAXINE SUCCINATE ER 50 MG PO TB24 Oral Take 1 tablet (50 mg total) by mouth daily.  3  . OXYCODONE-ACETAMINOPHEN 5-325 MG PO TABS Oral Take 1 tablet by mouth every 4 (four) hours as needed for pain. 40 tablet 0    BP 106/83  Pulse 74  Temp(Src) 97.6 F (36.4 C) (Oral)  Resp 20  SpO2 95%  Physical Exam  Nursing note and vitals reviewed. Constitutional: She is oriented to person, place, and time. She appears well-developed and well-nourished. No distress.  HENT:  Head: Normocephalic and atraumatic.  Right Ear: External ear normal.  Left Ear: External ear normal.    Nose: Nose normal.  Eyes: EOM are normal. Pupils are equal, round, and reactive to light.  Neck: Normal range of motion. Neck supple. No tracheal deviation present.  Cardiovascular: Normal rate, regular rhythm and normal heart sounds.   Pulmonary/Chest: Effort normal and breath sounds normal. No respiratory distress. She has no wheezes. She has no rales.  Abdominal: Soft. She exhibits no distension.  Musculoskeletal: Normal range of motion. She exhibits no edema.       Left arm radial pulses normal. Capillary refill less than 2 seconds. Left humeral tenderness that radiates to elbow.  Tender to palpation of her proximal humerus.  No focal tenderness over the elbow forearm or wrist.  Patient has sensation in the radial and ulnar distributions to light touch.  Patient can flex and extend all of her digits without difficulty.  Neurological: She is alert and oriented to person, place, and time. No sensory deficit.  Skin: Skin is warm and dry.  Psychiatric: She has a normal mood and affect. Her behavior is normal. Judgment and thought content normal.    ED Course  Procedures (including critical care time)  DIAGNOSTIC STUDIES: Oxygen Saturation is 95% on room air, adequate by my interpretation.    COORDINATION OF CARE:  2330: Discussed planned course of treatment with the patient who is agreeable at this time. Discussed imaging results. Discussed f/u with Dr. Despina Hick about broken humerus.    Labs Reviewed - No data to display No results found.   1. Proximal humerus fracture       MDM  Patient with proximal humerus fracture with no significant displacement.  Patient is neurovascularly intact.  Patient was placed in a sling and I believe is safe for discharge home.  She does have a friend here who can assist her and whom she will stay with tonight as well.  She has a orthopedist she can followup with this week.  We'll give her pain medicine here and pain medicine for home as well.  She'll  be placed in a sling is been instructed to wear the sling at all times.  She will followup with her orthopedist this week.  I personally performed the services described in this documentation, which was scribed in my presence. The recorded information has been reviewed and considered.        Nat Christen, MD 10/28/11 [phone]

## 2011-10-28 NOTE — ED Notes (Signed)
Per EMS pt tripped over a dog fell onto concrete on left side presents with left upper arm pain

## 2011-10-28 NOTE — Discharge Instructions (Signed)
Please call Dr. Tana Felts office on Monday to obtain an appointment to be seen this week for your humerus fracture.  Humerus Fracture, Treated with Immobilization The humerus is the large bone in your upper arm. You have a broken (fractured) humerus. These fractures are easily diagnosed with X-rays. TREATMENT  Simple fractures which will heal without disability are treated with simple immobilization. Immobilization means you will wear a cast, splint, or sling. You have a fracture which will do well with immobilization. The fracture will heal well simply by being held in a good position until it is stable enough to begin range of motion exercises. Do not take part in activities which would further injure your arm.  HOME CARE INSTRUCTIONS   Put ice on the injured area.   Put ice in a plastic bag.   Place a towel between your skin and the bag.   Leave the ice on for 15 to 20 minutes, 3 to 4 times a day.   If you have a cast:   Do not scratch the skin under the cast using sharp or pointed objects.   Check the skin around the cast every day. You may put lotion on any red or sore areas.   Keep your cast dry and clean.   If you have a splint:   Wear the splint as directed.   Keep your splint dry and clean.   You may loosen the elastic around the splint if your fingers become numb, tingle, or turn cold or blue.   If you have a sling:   Wear the sling as directed.   Do not put pressure on any part of your cast or splint until it is fully hardened.   Your cast or splint can be protected during bathing with a plastic bag. Do not lower the cast or splint into water.   Only take over-the-counter or prescription medicines for pain, discomfort, or fever as directed by your caregiver.   Do range of motion exercises as instructed by your caregiver.   Follow up as directed by your caregiver. This is very important in order to avoid permanent injury or disability and chronic pain.  SEEK  IMMEDIATE MEDICAL CARE IF:   Your skin or nails in the injured arm turn blue or gray.   Your arm feels cold or numb.   You develop severe pain in the injured arm.   You are having problems with the medicines you were given.  MAKE SURE YOU:   Understand these instructions.   Will watch your condition.   Will get help right away if you are not doing well or get worse.  Document Released: 10/09/2000 Document Revised: 06/22/2011 Document Reviewed: 08/17/2010 [facility] Patient Information 2012 [employer], [place].  Sling Use After Injury or Surgery You have been put in a sling today because of an injury or following surgery. If you have a tendon or bone injury it may take up to 6 weeks to heal. Use the sling as directed until your caregiver says it is no longer needed. The sling protects and keeps you from using the injured part. Hanging your arm in a sling will give rest and support to the injured part. This also helps with comfort and healing. Slings are used for injuries made worse or more painful by movement. Examples include:  Broken arms.   Broken collarbones.   Shoulder injuries.   Following surgery.  The sling should fit comfortably, with your elbow at one end of the sling and  your hand at the other end. Your elbow is bent 90 degrees lying across your waist and rests in the sling with your thumb pointing up. Your hand is outside the open end of the sling. Your hand should be slightly higher than your elbow. You may also pad the sling behind your neck with some cloth or foam rubber.  A swathe may also be used if it is necessary to keep you from lifting your injured arm. A swathe is a wrap or ace bandage that goes around your chest over your injured arm.  To take the weight off your neck, some slings have a strap that goes around your neck and down your back. One strap is connected to the closed elbow side of the sling with the other end of the strap attached to the wrist side. With a  sling like this, your injured shoulder, arm, wrist, or hand is in the sling, the weight is more on your shoulder and back. This is different from the illustration where the sling is supported only by the neck.  In an emergency, a sling can be as simple as a belt or towel tied around your neck to hold your forearm.  HOME CARE INSTRUCTIONS   Do not use your shoulder until instructed to by your caregiver.   If you have been prescribed physical therapy, keep appointments as directed.   For the first couple days following your injury and during times when you are sore, you may use ice on the injured area for 15 to 20 minutes 3 to 4 times per day while awake. Put the ice in a plastic bag and place a towel between the bag of ice and your skin. This will help keep the swelling down.   If there is numbness in the fifth finger and ring fingers you may need to pad the elbow to relieve pressure on the ulnar nerve (the crazy bone).   Keep your arm on your chest when lying down.   If a plaster splint was applied, wear the splint until you are seen for a follow-up examination. Rest it on nothing harder than a pillow the first 24 hours. Do not get it wet. You may take it off to take a shower or bath unless instructed otherwise by your caregiver.   You may have been given an elastic bandage to use with the plaster splint or alone. The splint is too tight if you have numbness, tingling, or if your hand becomes cold and blue. Adjust or reapply the bandage to make it comfortable.   Only take over-the-counter or prescription medicines for pain, discomfort, or fever as directed by your caregiver.   If range of motion exercises are permitted by your caregiver, do not go over the limits suggested. If you have increased pain from doing gentle exercises, stop the exercises until you see your caregiver again.   The length of time needed for healing depends on what your injury or surgery was.  SEEK IMMEDIATE MEDICAL CARE  IF:   You have an increase in bruising, swelling or pain in the area of your injury or surgery.   You notice a blue color of or coldness in your fingers.   Pain relief is not obtained with medications or any of your problems are getting worse.  Document Released: 02/15/2004 Document Revised: 06/22/2011 Document Reviewed: 05/18/2007 [facility] Patient Information 2012 [place], [place].

## 2011-11-03 ENCOUNTER — Inpatient Hospital Stay (HOSPITAL_COMMUNITY): Admission: RE | Admit: 2011-11-03 | Payer: Medicare Other | Source: Ambulatory Visit

## 2012-02-15 ENCOUNTER — Other Ambulatory Visit: Payer: Self-pay | Admitting: Orthopedic Surgery

## 2012-02-15 MED ORDER — DEXAMETHASONE SODIUM PHOSPHATE 10 MG/ML IJ SOLN: 10.0000 mg | Freq: Once | INTRAMUSCULAR | Status: DC

## 2012-02-15 MED ORDER — BUPIVACAINE 0.25 % ON-Q PUMP SINGLE CATH 300ML: 300.0000 mL | INJECTION | Status: DC

## 2012-02-15 NOTE — Progress Notes (Signed)
Preoperative surgical orders have been place into the Epic hospital system for [name] on 02/15/2012, 10:28 PM  by Patrica Duel for surgery on 03/13/12.  Preop Total Knee orders including Bupivacaine On-Q pump, IV Tylenol, and IV Decadron as long as there are no contraindications to the above medications. Avel Peace, PA-C

## 2012-02-26 ENCOUNTER — Other Ambulatory Visit: Payer: Self-pay | Admitting: Orthopedic Surgery

## 2012-03-01 ENCOUNTER — Encounter (HOSPITAL_COMMUNITY): Payer: Self-pay

## 2012-03-08 ENCOUNTER — Encounter (HOSPITAL_COMMUNITY)
Admission: RE | Admit: 2012-03-08 | Discharge: 2012-03-08 | Disposition: A | Discharge status: Home / Self Care | Payer: Medicare Other | Source: Ambulatory Visit | Attending: Orthopedic Surgery | Admitting: Orthopedic Surgery

## 2012-03-08 ENCOUNTER — Encounter (HOSPITAL_COMMUNITY): Payer: Self-pay

## 2012-03-08 HISTORY — DX: Personal history of other diseases of the digestive system: Z87.19

## 2012-03-08 HISTORY — DX: Essential tremor: G25.0

## 2012-03-08 HISTORY — DX: Gastro-esophageal reflux disease without esophagitis: K21.9

## 2012-03-08 HISTORY — DX: Unspecified fracture of upper end of left humerus, initial encounter for closed fracture: S42.202A

## 2012-03-08 LAB — URINALYSIS, ROUTINE W REFLEX MICROSCOPIC
Nitrite: NEGATIVE
Specific Gravity, Urine: 1.015 (ref 1.005–1.030)
Urobilinogen, UA: 0.2 mg/dL (ref 0.0–1.0)
pH: 5.5 (ref 5.0–8.0)

## 2012-03-08 LAB — COMPREHENSIVE METABOLIC PANEL
AST: 24 U/L (ref 0–37)
Albumin: 4.3 g/dL (ref 3.5–5.2)
BUN: 16 mg/dL (ref 6–23)
Chloride: 101 mEq/L (ref 96–112)
Creatinine, Ser: 0.8 mg/dL (ref 0.50–1.10)
Total Bilirubin: 0.7 mg/dL (ref 0.3–1.2)
Total Protein: 7.6 g/dL (ref 6.0–8.3)

## 2012-03-08 LAB — PROTIME-INR
INR: 0.94 (ref 0.00–1.49)
Prothrombin Time: 12.8 seconds (ref 11.6–15.2)

## 2012-03-08 LAB — CBC
HCT: 43.7 % (ref 36.0–46.0)
MCHC: 34.1 g/dL (ref 30.0–36.0)
MCV: 93.6 fL (ref 78.0–100.0)
Platelets: 198 10*3/uL (ref 150–400)
RDW: 12.9 % (ref 11.5–15.5)
WBC: 7 10*3/uL (ref 4.0–10.5)

## 2012-03-08 LAB — SURGICAL PCR SCREEN: MRSA, PCR: NEGATIVE

## 2012-03-08 LAB — APTT: aPTT: 33 seconds (ref 24–37)

## 2012-03-08 NOTE — Pre-Procedure Instructions (Signed)
PREOP CBC, CMET, PT, PTT, UA WERE DONE TODAY AT [facility] AS PER ORDERS DR. Lequita Halt.  PT HAS EKG REPORT ON CHART FROM DR. Blima Ledger WAS DONE 09/11/11.  PT  DOES NOT NEED CXR PER ANESTHESIOLOGIST'S GUIDELINES. PREOP INSTRUCTIONS DISCUSSED WITH PT USING TEACH BACK METHOD.

## 2012-03-08 NOTE — Patient Instructions (Signed)
YOUR SURGERY IS SCHEDULED ON:  WED  8/28  AT 3:00 PM  REPORT TO [place] [facility] AT:  12:30 PM      PHONE # FOR SHORT STAY IS [phone]  DO NOT EAT ANYTHING AFTER MIDNIGHT THE NIGHT BEFORE YOUR SURGERY.  NO FOOD, NO CHEWING GUM, NO MINTS, NO CANDIES, NO CHEWING TOBACCO. YOU MAY HAVE CLEAR LIQUIDS TO DRINK FROM MIDNIGHT UNTIL 9:00AM DAY OF SURGERY--WATER, COFFEE -NO MILK OR MILK PRODUCTS, CRANBERRY JUICE.   NOTHING TO DRINK AFTER 9:00 AM THE DAY OF YOUR SURGERY.  PLEASE TAKE THE FOLLOWING MEDICATIONS THE AM OF YOUR SURGERY WITH A FEW SIPS OF WATER:   DIAZEPAM, NEXIUM, PRIMIDONE    IF YOU USE INHALERS--USE YOUR INHALERS THE AM OF YOUR SURGERY AND BRING INHALERS TO THE HOSPITAL -TAKE TO SURGERY.    IF YOU ARE DIABETIC:  DO NOT TAKE ANY DIABETIC MEDICATIONS THE AM OF YOUR SURGERY.  IF YOU TAKE INSULIN IN THE EVENINGS--PLEASE ONLY TAKE 1/2 NORMAL EVENING DOSE THE NIGHT BEFORE YOUR SURGERY.  NO INSULIN THE AM OF YOUR SURGERY.  IF YOU HAVE SLEEP APNEA AND USE CPAP OR BIPAP--PLEASE BRING THE MASK --NOT THE MACHINE-NOT THE TUBING   -JUST THE MASK. DO NOT BRING VALUABLES, MONEY, CREDIT CARDS.  CONTACT LENS, DENTURES / PARTIALS, GLASSES SHOULD NOT BE WORN TO SURGERY AND IN MOST CASES-HEARING AIDS WILL NEED TO BE REMOVED.  BRING YOUR GLASSES CASE, ANY EQUIPMENT NEEDED FOR YOUR CONTACT LENS. FOR PATIENTS ADMITTED TO THE HOSPITAL--CHECK OUT TIME THE DAY OF DISCHARGE IS 11:00 AM.  ALL INPATIENT ROOMS ARE PRIVATE - WITH BATHROOM, TELEPHONE, TELEVISION AND WIFI INTERNET. IF YOU ARE BEING DISCHARGED THE SAME DAY OF YOUR SURGERY--YOU CAN NOT DRIVE YOURSELF HOME--AND SHOULD NOT GO HOME ALONE BY TAXI OR BUS.  NO DRIVING OR OPERATING MACHINERY FOR 24 HOURS FOLLOWING ANESTHESIA / PAIN MEDICATIONS.                            SPECIAL INSTRUCTIONS:  CHLORHEXIDINE SOAP SHOWER (other brand names are Betasept and Hibiclens ) PLEASE SHOWER WITH CHLORHEXIDINE THE NIGHT BEFORE YOUR SURGERY AND THE AM OF YOUR  SURGERY. DO NOT USE CHLORHEXIDINE ON YOUR FACE OR PRIVATE AREAS--YOU MAY USE YOUR NORMAL SOAP THOSE AREAS AND YOUR NORMAL SHAMPOO.  WOMEN SHOULD AVOID SHAVING UNDER ARMS AND SHAVING LEGS 48 HOURS BEFORE USING CHLORHEXIDINE TO AVOID SKIN IRRITATION.  DO NOT USE IF ALLERGIC TO CHLORHEXIDINE.  PLEASE READ OVER ANY  FACT SHEETS THAT YOU WERE GIVEN: MRSA INFORMATION, BLOOD TRANSFUSION INFORMATION, INCENTIVE SPIROMETER INFORMATION.

## 2012-03-12 ENCOUNTER — Other Ambulatory Visit: Payer: Self-pay | Admitting: Orthopedic Surgery

## 2012-03-12 NOTE — H&P (Signed)
[name]  DOB: 04/11/1934 Married / Language: English / Race: White Female  Date of Admission:  03/13/2012  Chief complaint:  Left greater than right knee pain  History of Present Illness The patient is a 78 year old female who comes in for a preoperative History and Physical. The patient is scheduled for a left total knee arthroplasty to be performed by Dr. Frank V. Aluisio, MD at [facility] on 03/13/2012. The patient is a 77 year old female who presents for a recheck of her Knee. The patient is being followed for their bilateral knee pain and osteoarthritis. Symptoms reported today include: pain, swelling and aching. The patient feels that they are doing poorly. Current treatment includes: NSAIDs (Aleve). The patient presents today following Synvisc series. The patient has not gotten any relief of their symptoms with viscosupplementation. The Visco supplements did not help. Her left knee is worse than her right. She feels like she is having a hard time getting around. The knee is limiting what she can and cannot do. Pain is occurring with most, if not all activities. She does have pain at night. She would also like a Right Knee Cortisone Injection at the time of surgery.  She is ready to get the left knee replaced. They have been treated conservatively in the past for the above stated problem and despite conservative measures, they continue to have progressive pain and severe functional limitations and dysfunction. They have failed non-operative management including home exercise, medications, and injections. It is felt that they would benefit from undergoing total joint replacement. Risks and benefits of the procedure have been discussed with the patient and they elect to proceed with surgery. There are no active contraindications to surgery such as ongoing infection or rapidly progressive neurological disease.   Problem List/Past Medical Fracture, Proximal  Humerus, closed (812.09) S/P arthroscopy of knee (V45.89) Sprain/Strain, Wrist (842.00) Essential tremor (333.1) Osteoarthrosis NOS, lower leg (715.96). 11/03/2010 Tear, medial meniscus, knee, current (836.0). 12/20/2010   Allergies Sulfa Drugs. Rash.   Family History Cancer. grandfather mothers side Cerebrovascular Accident. mother Kidney disease. father   Social History Children. 4 Current work status. retired Drug/Alcohol Rehab (Currently). no Alcohol use. current drinker; drinks wine; 8-14 per week Drug/Alcohol Rehab (Previously). no Exercise. Exercises rarely Illicit drug use. no Living situation. live alone Marital status. widowed Number of flights of stairs before winded. 1 Tobacco use. Never smoker. never smoker Tobacco / smoke exposure. no Pain Contract. no Post-Surgical Plans. Plan is to go to Rehab after the hopsital stay.   Medication History Primidone (50MG Tablet, 1/2 tablet Oral three times daily) Active. NexIUM (40MG Capsule DR, Oral daily) Active. Diazepam (5MG Tablet, Oral two times daily) Active.   Pregnancy / Birth History Pregnant. no   Past Surgical History Arthroscopy of Knee. bilateral Foot Surgery. right   Osteoarthritis Gastroesophageal Reflux Disease   Review of Systems General:Not Present- Chills, Fever, Night Sweats, Fatigue, Weight Gain, Weight Loss and Memory Loss. Skin:Not Present- Hives, Itching, Rash, Eczema and Lesions. HEENT:Not Present- Tinnitus, Headache, Double Vision, Visual Loss, Hearing Loss and Dentures. Respiratory:Not Present- Shortness of breath with exertion, Shortness of breath at rest, Allergies, Coughing up blood and Chronic Cough. Cardiovascular:Not Present- Chest Pain, Racing/skipping heartbeats, Difficulty Breathing Lying Down, Murmur, Swelling and Palpitations. Gastrointestinal:Not Present- Bloody Stool, Heartburn, Abdominal Pain, Vomiting, Nausea, Constipation,  Diarrhea, Difficulty Swallowing, Jaundice and Loss of appetitie. Female Genitourinary:Not Present- Blood in Urine, Urinary frequency, Weak urinary stream, Discharge, Flank Pain, Incontinence, Painful Urination, Urgency,   Urinary Retention and Urinating at Night. Musculoskeletal:Not Present- Muscle Weakness, Muscle Pain, Joint Swelling, Joint Pain, Back Pain, Morning Stiffness and Spasms. Neurological:Not Present- Tremor, Dizziness, Blackout spells, Paralysis, Difficulty with balance and Weakness. Psychiatric:Not Present- Insomnia.   Vitals Weight: 160 lb Height: 64 in Body Surface Area: 1.81 m Body Mass Index: 27.46 kg/m Pulse: 64 (Regular) Resp.: 14 (Unlabored) BP: 156/80 (Sitting, Right Arm, Standard)  Physical Exam The physical exam findings are as follows:  Note: Patient is a 77 year old female with continued bilateral knee pain.   General Mental Status - Alert, cooperative and good historian. General Appearance- pleasant. Not in acute distress. Orientation- Oriented X3. Build & Nutrition- Well nourished and Well developed.   Head and Neck Head- normocephalic, atraumatic . Neck Global Assessment- supple. no bruit auscultated on the right and no bruit auscultated on the left.   Eye Pupil- Bilateral- Regular and Round. Note: Wears glasses Motion- Bilateral- EOMI. wears glasses  Chest and Lung Exam Auscultation: Breath sounds:- clear at anterior chest wall and - clear at posterior chest wall. Adventitious sounds:- No Adventitious sounds.   Cardiovascular Auscultation:Rhythm- Regular rate and rhythm. Heart Sounds- S1 WNL and S2 WNL. Murmurs & Other Heart Sounds:Auscultation of the heart reveals - No Murmurs.   Abdomen Palpation/Percussion:Tenderness- Abdomen is non-tender to palpation. Rigidity (guarding)- Abdomen is soft. Auscultation:Auscultation of the abdomen reveals - Bowel sounds normal.   Female  Genitourinary Not done, not pertinent to present illness  Musculoskeletal On exam she is alert and oriented in no apparent distress. Her left knee shows no swelling. Her range is about 5 to 120. There is moderate crepitus on range of motion. She has a varus deformity. She is tender medial greater than lateral. There is no instability noted. The right knee, no effusion. Range 0 to 125. Moderate crepitus on range of motion. Tender medial greater than lateral. No instability.  RADIOGRAPHS: AP both knees and lateral. The arthritis is progressing significantly. The left knee, she is now bone on bone medial and patellofemoral, worse now on the left knee. She has bony erosions and varus deformity. She has large medial osteophytes. Her right knee has now progressed to bone on bone medial. She is not as bad on the right as the left, but has progressed significantly in the past year.  Assessment & Plan Osteoarthrosis NOS, lower leg (715.96) Impression: Left greater than Right Knee  Note: Patient is for a Left Total Knee Repalcement and also a Right Knee Cortisone Injection by Dr. Aluisio.  Plan is to go to Rehab after the hospital stay.  PCP - Dr. David Bouska  Signed electronically by DREW L Ignatz Deis, PA-C  

## 2012-03-13 ENCOUNTER — Encounter (HOSPITAL_COMMUNITY)
Admission: RE | Disposition: A | Discharge status: Skilled Nursing Facility | Payer: Self-pay | Source: Ambulatory Visit | Attending: Orthopedic Surgery

## 2012-03-13 ENCOUNTER — Encounter (HOSPITAL_COMMUNITY): Payer: Self-pay | Admitting: Registered Nurse

## 2012-03-13 ENCOUNTER — Ambulatory Visit (HOSPITAL_COMMUNITY): Payer: Medicare Other | Admitting: Registered Nurse

## 2012-03-13 ENCOUNTER — Encounter (HOSPITAL_COMMUNITY): Payer: Self-pay | Admitting: *Deleted

## 2012-03-13 ENCOUNTER — Inpatient Hospital Stay (HOSPITAL_COMMUNITY)
Admission: RE | Admit: 2012-03-13 | Discharge: 2012-03-16 | DRG: 470 | Disposition: A | Discharge status: Skilled Nursing Facility | Payer: Medicare Other | Source: Ambulatory Visit | Attending: Orthopedic Surgery | Admitting: Orthopedic Surgery

## 2012-03-13 DIAGNOSIS — G250 Essential tremor: Secondary | ICD-10-CM | POA: Diagnosis present

## 2012-03-13 DIAGNOSIS — G252 Other specified forms of tremor: Secondary | ICD-10-CM | POA: Diagnosis present

## 2012-03-13 DIAGNOSIS — M1710 Unilateral primary osteoarthritis, unspecified knee: Principal | ICD-10-CM | POA: Diagnosis present

## 2012-03-13 DIAGNOSIS — K219 Gastro-esophageal reflux disease without esophagitis: Secondary | ICD-10-CM | POA: Diagnosis present

## 2012-03-13 DIAGNOSIS — E871 Hypo-osmolality and hyponatremia: Secondary | ICD-10-CM | POA: Diagnosis not present

## 2012-03-13 DIAGNOSIS — Z96659 Presence of unspecified artificial knee joint: Secondary | ICD-10-CM

## 2012-03-13 DIAGNOSIS — D62 Acute posthemorrhagic anemia: Secondary | ICD-10-CM | POA: Diagnosis not present

## 2012-03-13 DIAGNOSIS — Z01812 Encounter for preprocedural laboratory examination: Secondary | ICD-10-CM

## 2012-03-13 HISTORY — PX: TOTAL KNEE ARTHROPLASTY: SHX125

## 2012-03-13 LAB — TYPE AND SCREEN: Antibody Screen: NEGATIVE

## 2012-03-13 LAB — ABO/RH: ABO/RH(D): A POS

## 2012-03-13 SURGERY — ARTHROPLASTY, KNEE, TOTAL
Anesthesia: General | Site: Knee | Laterality: Left | Wound class: Clean

## 2012-03-13 MED ORDER — ONDANSETRON HCL 4 MG/2ML IJ SOLN: 4.0000 mg | Freq: Four times a day (QID) | INTRAMUSCULAR | Status: DC | PRN

## 2012-03-13 MED ORDER — BUPIVACAINE ON-Q PAIN PUMP (FOR ORDER SET NO CHG)
INJECTION | Status: DC
  Filled 2012-03-13: qty 1

## 2012-03-13 MED ORDER — BISACODYL 10 MG RE SUPP
10.0000 mg | Freq: Every day | RECTAL | Status: DC | PRN
  Administered 2012-03-16: 10 mg via RECTAL
  Filled 2012-03-13: qty 1

## 2012-03-13 MED ORDER — CHLORHEXIDINE GLUCONATE 4 % EX LIQD
60.0000 mL | Freq: Once | CUTANEOUS | Status: DC
  Filled 2012-03-13: qty 60

## 2012-03-13 MED ORDER — PROPOFOL 10 MG/ML IV EMUL
INTRAVENOUS | Status: DC | PRN
  Administered 2012-03-13: 100 ug/kg/min via INTRAVENOUS

## 2012-03-13 MED ORDER — LACTATED RINGERS IV SOLN: INTRAVENOUS | Status: DC

## 2012-03-13 MED ORDER — CEFAZOLIN SODIUM-DEXTROSE 2-3 GM-% IV SOLR
INTRAVENOUS | Status: AC
  Filled 2012-03-13: qty 50

## 2012-03-13 MED ORDER — PRIMIDONE 50 MG PO TABS
50.0000 mg | ORAL_TABLET | Freq: Two times a day (BID) | ORAL | Status: DC
Start: 2012-03-13 — End: 2012-03-16
  Administered 2012-03-13 – 2012-03-16 (×6): 50 mg via ORAL
  Filled 2012-03-13 (×8): qty 1

## 2012-03-13 MED ORDER — TRAMADOL HCL 50 MG PO TABS
50.0000 mg | ORAL_TABLET | Freq: Four times a day (QID) | ORAL | Status: DC | PRN
  Administered 2012-03-16: 50 mg via ORAL
  Filled 2012-03-13: qty 2
  Filled 2012-03-13: qty 1

## 2012-03-13 MED ORDER — LACTATED RINGERS IV SOLN
INTRAVENOUS | Status: DC | PRN
  Administered 2012-03-13: 13:00:00 via INTRAVENOUS

## 2012-03-13 MED ORDER — ACETAMINOPHEN 10 MG/ML IV SOLN
1000.0000 mg | Freq: Once | INTRAVENOUS | Status: AC
  Administered 2012-03-13: 1000 mg via INTRAVENOUS

## 2012-03-13 MED ORDER — CEFAZOLIN SODIUM-DEXTROSE 2-3 GM-% IV SOLR
2.0000 g | INTRAVENOUS | Status: AC
  Administered 2012-03-13: 2 g via INTRAVENOUS

## 2012-03-13 MED ORDER — ACETAMINOPHEN 650 MG RE SUPP
650.0000 mg | Freq: Four times a day (QID) | RECTAL | Status: DC | PRN
  Filled 2012-03-13: qty 1

## 2012-03-13 MED ORDER — METHOCARBAMOL 500 MG PO TABS
500.0000 mg | ORAL_TABLET | Freq: Four times a day (QID) | ORAL | Status: DC | PRN
  Administered 2012-03-14 – 2012-03-16 (×8): 500 mg via ORAL
  Filled 2012-03-13 (×8): qty 1

## 2012-03-13 MED ORDER — ONDANSETRON HCL 4 MG PO TABS
4.0000 mg | ORAL_TABLET | Freq: Four times a day (QID) | ORAL | Status: DC | PRN
  Administered 2012-03-16: 4 mg via ORAL
  Filled 2012-03-13 (×2): qty 1

## 2012-03-13 MED ORDER — DEXTROSE-NACL 5-0.9 % IV SOLN
INTRAVENOUS | Status: DC
  Administered 2012-03-13 – 2012-03-14 (×3): via INTRAVENOUS

## 2012-03-13 MED ORDER — HYDROMORPHONE HCL PF 1 MG/ML IJ SOLN
0.2500 mg | INTRAMUSCULAR | Status: DC | PRN
  Administered 2012-03-13 (×2): 0.5 mg via INTRAVENOUS

## 2012-03-13 MED ORDER — MORPHINE SULFATE (PF) 1 MG/ML IV SOLN
INTRAVENOUS | Status: DC
  Administered 2012-03-13 (×2): 1 mg via INTRAVENOUS
  Administered 2012-03-13: 17:00:00 via INTRAVENOUS
  Administered 2012-03-13: 9 mg via INTRAVENOUS
  Administered 2012-03-14: 06:00:00 via INTRAVENOUS
  Administered 2012-03-14: 5 mg via INTRAVENOUS
  Filled 2012-03-13: qty 25

## 2012-03-13 MED ORDER — ACETAMINOPHEN 10 MG/ML IV SOLN
1000.0000 mg | Freq: Four times a day (QID) | INTRAVENOUS | Status: AC
  Administered 2012-03-13 – 2012-03-14 (×4): 1000 mg via INTRAVENOUS
  Filled 2012-03-13 (×7): qty 100

## 2012-03-13 MED ORDER — BUPIVACAINE 0.25 % ON-Q PUMP SINGLE CATH 300ML
INJECTION | Status: DC | PRN
  Administered 2012-03-13: 300 mL

## 2012-03-13 MED ORDER — OXYCODONE HCL 5 MG PO TABS
5.0000 mg | ORAL_TABLET | ORAL | Status: DC | PRN
  Administered 2012-03-14: 5 mg via ORAL
  Administered 2012-03-14 – 2012-03-16 (×10): 10 mg via ORAL
  Filled 2012-03-13 (×7): qty 2
  Filled 2012-03-13: qty 1
  Filled 2012-03-13 (×3): qty 2

## 2012-03-13 MED ORDER — HYDROMORPHONE HCL PF 1 MG/ML IJ SOLN
INTRAMUSCULAR | Status: AC
  Filled 2012-03-13: qty 1

## 2012-03-13 MED ORDER — METOCLOPRAMIDE HCL 5 MG/ML IJ SOLN: 5.0000 mg | Freq: Three times a day (TID) | INTRAMUSCULAR | Status: DC | PRN

## 2012-03-13 MED ORDER — MENTHOL 3 MG MT LOZG
1.0000 | LOZENGE | OROMUCOSAL | Status: DC | PRN
  Filled 2012-03-13: qty 9

## 2012-03-13 MED ORDER — HYOSCYAMINE SULFATE 0.125 MG PO TABS
0.1250 mg | ORAL_TABLET | ORAL | Status: DC | PRN
  Administered 2012-03-16: 0.125 mg via ORAL
  Filled 2012-03-13: qty 1

## 2012-03-13 MED ORDER — PROPOFOL 10 MG/ML IV BOLUS
INTRAVENOUS | Status: DC | PRN
  Administered 2012-03-13: 40 mg via INTRAVENOUS

## 2012-03-13 MED ORDER — BUPIVACAINE 0.25 % ON-Q PUMP SINGLE CATH 300ML
INJECTION | Status: AC
  Filled 2012-03-13: qty 300

## 2012-03-13 MED ORDER — MIDAZOLAM HCL 5 MG/5ML IJ SOLN
INTRAMUSCULAR | Status: DC | PRN
  Administered 2012-03-13 (×2): 1 mg via INTRAVENOUS

## 2012-03-13 MED ORDER — DIPHENHYDRAMINE HCL 12.5 MG/5ML PO ELIX
12.5000 mg | ORAL_SOLUTION | Freq: Four times a day (QID) | ORAL | Status: DC | PRN
  Filled 2012-03-13: qty 5

## 2012-03-13 MED ORDER — HETASTARCH-ELECTROLYTES 6 % IV SOLN
INTRAVENOUS | Status: DC | PRN
  Administered 2012-03-13: 15:00:00 via INTRAVENOUS

## 2012-03-13 MED ORDER — PHENYLEPHRINE HCL 10 MG/ML IJ SOLN
10.0000 mg | INTRAVENOUS | Status: DC | PRN
  Administered 2012-03-13: 10 ug/min via INTRAVENOUS

## 2012-03-13 MED ORDER — ACETAMINOPHEN 10 MG/ML IV SOLN
INTRAVENOUS | Status: AC
  Filled 2012-03-13: qty 100

## 2012-03-13 MED ORDER — SODIUM CHLORIDE 0.9 % IV SOLN: INTRAVENOUS | Status: DC

## 2012-03-13 MED ORDER — ACETAMINOPHEN 325 MG PO TABS
650.0000 mg | ORAL_TABLET | Freq: Four times a day (QID) | ORAL | Status: DC | PRN
  Administered 2012-03-15 (×2): 325 mg via ORAL
  Filled 2012-03-13 (×2): qty 1

## 2012-03-13 MED ORDER — DIAZEPAM 5 MG PO TABS: 5.0000 mg | ORAL_TABLET | Freq: Two times a day (BID) | ORAL | Status: DC | PRN

## 2012-03-13 MED ORDER — POLYETHYLENE GLYCOL 3350 17 G PO PACK
17.0000 g | PACK | Freq: Every day | ORAL | Status: DC | PRN
  Administered 2012-03-16: 17 g via ORAL
  Filled 2012-03-13: qty 1

## 2012-03-13 MED ORDER — METHOCARBAMOL 100 MG/ML IJ SOLN
500.0000 mg | Freq: Four times a day (QID) | INTRAVENOUS | Status: DC | PRN
  Administered 2012-03-13 – 2012-03-14 (×2): 500 mg via INTRAVENOUS
  Filled 2012-03-13 (×2): qty 5

## 2012-03-13 MED ORDER — MORPHINE SULFATE (PF) 1 MG/ML IV SOLN
INTRAVENOUS | Status: AC
  Filled 2012-03-13: qty 25

## 2012-03-13 MED ORDER — METOCLOPRAMIDE HCL 5 MG PO TABS
5.0000 mg | ORAL_TABLET | Freq: Three times a day (TID) | ORAL | Status: DC | PRN
  Filled 2012-03-13: qty 2

## 2012-03-13 MED ORDER — DIPHENHYDRAMINE HCL 50 MG/ML IJ SOLN: 12.5000 mg | Freq: Four times a day (QID) | INTRAMUSCULAR | Status: DC | PRN

## 2012-03-13 MED ORDER — FENTANYL CITRATE 0.05 MG/ML IJ SOLN
INTRAMUSCULAR | Status: DC | PRN
  Administered 2012-03-13: 50 ug via INTRAVENOUS

## 2012-03-13 MED ORDER — SODIUM CHLORIDE 0.9 % IJ SOLN: 9.0000 mL | INTRAMUSCULAR | Status: DC | PRN

## 2012-03-13 MED ORDER — CEFAZOLIN SODIUM 1-5 GM-% IV SOLN
1.0000 g | Freq: Four times a day (QID) | INTRAVENOUS | Status: AC
  Administered 2012-03-13 – 2012-03-14 (×2): 1 g via INTRAVENOUS
  Filled 2012-03-13 (×2): qty 50

## 2012-03-13 MED ORDER — DIPHENHYDRAMINE HCL 12.5 MG/5ML PO ELIX
12.5000 mg | ORAL_SOLUTION | ORAL | Status: DC | PRN
  Filled 2012-03-13: qty 10

## 2012-03-13 MED ORDER — NALOXONE HCL 0.4 MG/ML IJ SOLN: 0.4000 mg | INTRAMUSCULAR | Status: DC | PRN

## 2012-03-13 MED ORDER — PHENOL 1.4 % MT LIQD: 1.0000 | OROMUCOSAL | Status: DC | PRN

## 2012-03-13 MED ORDER — ONDANSETRON HCL 4 MG/2ML IJ SOLN
4.0000 mg | Freq: Four times a day (QID) | INTRAMUSCULAR | Status: DC | PRN
  Administered 2012-03-13 – 2012-03-14 (×2): 4 mg via INTRAVENOUS
  Filled 2012-03-13 (×2): qty 2

## 2012-03-13 MED ORDER — FLEET ENEMA 7-19 GM/118ML RE ENEM
1.0000 | ENEMA | Freq: Once | RECTAL | Status: AC | PRN
  Filled 2012-03-13: qty 1

## 2012-03-13 MED ORDER — 0.9 % SODIUM CHLORIDE (POUR BTL) OPTIME
TOPICAL | Status: DC | PRN
  Administered 2012-03-13: 1000 mL

## 2012-03-13 MED ORDER — DOCUSATE SODIUM 100 MG PO CAPS
100.0000 mg | ORAL_CAPSULE | Freq: Two times a day (BID) | ORAL | Status: DC
Start: 2012-03-13 — End: 2012-03-16
  Administered 2012-03-13 – 2012-03-16 (×6): 100 mg via ORAL
  Filled 2012-03-13 (×3): qty 1

## 2012-03-13 MED ORDER — PANTOPRAZOLE SODIUM 40 MG PO TBEC
80.0000 mg | DELAYED_RELEASE_TABLET | Freq: Every day | ORAL | Status: DC
  Filled 2012-03-13 (×2): qty 2

## 2012-03-13 MED ORDER — PROMETHAZINE HCL 25 MG/ML IJ SOLN: 6.2500 mg | INTRAMUSCULAR | Status: DC | PRN

## 2012-03-13 MED ORDER — METHYLPREDNISOLONE ACETATE 40 MG/ML IJ SUSP
INTRAMUSCULAR | Status: AC
  Filled 2012-03-13: qty 2

## 2012-03-13 MED ORDER — RIVAROXABAN 10 MG PO TABS
10.0000 mg | ORAL_TABLET | Freq: Every day | ORAL | Status: DC
  Administered 2012-03-14 – 2012-03-16 (×3): 10 mg via ORAL
  Filled 2012-03-13 (×5): qty 1

## 2012-03-13 SURGICAL SUPPLY — 56 items
BAG SPEC THK2 15X12 ZIP CLS (MISCELLANEOUS) ×1
BAG ZIPLOCK 12X15 (MISCELLANEOUS) ×2 IMPLANT
BANDAGE ADHESIVE 1X3 (GAUZE/BANDAGES/DRESSINGS) ×1 IMPLANT
BANDAGE ELASTIC 6 VELCRO ST LF (GAUZE/BANDAGES/DRESSINGS) ×2 IMPLANT
BANDAGE ESMARK 6X9 LF (GAUZE/BANDAGES/DRESSINGS) ×1 IMPLANT
BLADE SAG 18X100X1.27 (BLADE) ×2 IMPLANT
BLADE SAW SGTL 11.0X1.19X90.0M (BLADE) ×2 IMPLANT
BNDG CMPR 9X6 STRL LF SNTH (GAUZE/BANDAGES/DRESSINGS) ×1
BNDG ESMARK 6X9 LF (GAUZE/BANDAGES/DRESSINGS) ×2
BOWL SMART MIX CTS (DISPOSABLE) ×2 IMPLANT
CATH KIT ON-Q SILVERSOAK 5 (CATHETERS) ×1 IMPLANT
CATH KIT ON-Q SILVERSOAK 5IN (CATHETERS) ×2 IMPLANT
CEMENT HV SMART SET (Cement) ×4 IMPLANT
CLOTH BEACON ORANGE TIMEOUT ST (SAFETY) ×2 IMPLANT
CLSR STERI-STRIP ANTIMIC 1/2X4 (GAUZE/BANDAGES/DRESSINGS) ×1 IMPLANT
CUFF TOURN SGL QUICK 34 (TOURNIQUET CUFF) ×2
CUFF TRNQT CYL 34X4X40X1 (TOURNIQUET CUFF) ×1 IMPLANT
DRAPE EXTREMITY T 121X128X90 (DRAPE) ×2 IMPLANT
DRAPE POUCH INSTRU U-SHP 10X18 (DRAPES) ×2 IMPLANT
DRAPE U-SHAPE 47X51 STRL (DRAPES) ×2 IMPLANT
DRSG ADAPTIC 3X8 NADH LF (GAUZE/BANDAGES/DRESSINGS) ×2 IMPLANT
DRSG PAD ABDOMINAL 8X10 ST (GAUZE/BANDAGES/DRESSINGS) ×1 IMPLANT
DURAPREP 26ML APPLICATOR (WOUND CARE) ×2 IMPLANT
ELECT REM PT RETURN 9FT ADLT (ELECTROSURGICAL) ×2
ELECTRODE REM PT RTRN 9FT ADLT (ELECTROSURGICAL) ×1 IMPLANT
EVACUATOR 1/8 PVC DRAIN (DRAIN) ×2 IMPLANT
FACESHIELD LNG OPTICON STERILE (SAFETY) ×10 IMPLANT
GLOVE BIO SURGEON STRL SZ8 (GLOVE) ×2 IMPLANT
GLOVE BIOGEL PI IND STRL 8 (GLOVE) ×2 IMPLANT
GLOVE BIOGEL PI INDICATOR 8 (GLOVE) ×2
GLOVE ECLIPSE 8.0 STRL XLNG CF (GLOVE) ×2 IMPLANT
GLOVE SURG SS PI 6.5 STRL IVOR (GLOVE) ×4 IMPLANT
GOWN STRL NON-REIN LRG LVL3 (GOWN DISPOSABLE) ×4 IMPLANT
GOWN STRL REIN XL XLG (GOWN DISPOSABLE) ×2 IMPLANT
HANDPIECE INTERPULSE COAX TIP (DISPOSABLE) ×2
IMMOBILIZER KNEE 20 (SOFTGOODS) ×2
IMMOBILIZER KNEE 20 THIGH 36 (SOFTGOODS) ×1 IMPLANT
KIT BASIN OR (CUSTOM PROCEDURE TRAY) ×2 IMPLANT
MANIFOLD NEPTUNE II (INSTRUMENTS) ×2 IMPLANT
NS IRRIG 1000ML POUR BTL (IV SOLUTION) ×2 IMPLANT
PACK TOTAL JOINT (CUSTOM PROCEDURE TRAY) ×2 IMPLANT
PAD ABD 7.5X8 STRL (GAUZE/BANDAGES/DRESSINGS) ×2 IMPLANT
PADDING CAST COTTON 6X4 STRL (CAST SUPPLIES) ×4 IMPLANT
POSITIONER SURGICAL ARM (MISCELLANEOUS) ×2 IMPLANT
SET HNDPC FAN SPRY TIP SCT (DISPOSABLE) ×1 IMPLANT
SPONGE GAUZE 4X4 12PLY (GAUZE/BANDAGES/DRESSINGS) ×2 IMPLANT
STRIP CLOSURE SKIN 1/2X4 (GAUZE/BANDAGES/DRESSINGS) ×4 IMPLANT
SUCTION FRAZIER 12FR DISP (SUCTIONS) ×2 IMPLANT
SUT MNCRL AB 4-0 PS2 18 (SUTURE) ×2 IMPLANT
SUT VIC AB 2-0 CT1 27 (SUTURE) ×6
SUT VIC AB 2-0 CT1 TAPERPNT 27 (SUTURE) ×3 IMPLANT
SUT VLOC 180 0 24IN GS25 (SUTURE) ×2 IMPLANT
TOWEL OR 17X26 10 PK STRL BLUE (TOWEL DISPOSABLE) ×4 IMPLANT
TRAY FOLEY CATH 14FRSI W/METER (CATHETERS) ×2 IMPLANT
WATER STERILE IRR 1500ML POUR (IV SOLUTION) ×2 IMPLANT
WRAP KNEE MAXI GEL POST OP (GAUZE/BANDAGES/DRESSINGS) ×4 IMPLANT

## 2012-03-13 NOTE — Op Note (Addendum)
Pre-operative diagnosis- Osteoarthritis  Bilateral knee(s)  Post-operative diagnosis- Osteoarthritis Bilateral knee(s)  Procedure- 1.  Left  Total Knee Arthroplasty  2. Cortisone injection right knee  Surgeon- Krista Rankin. Amere Bricco, MD  Assistant- Tech assist   Anesthesia-  Spinal EBL-* No blood loss amount entered *  Drains Hemovac  Tourniquet time- 36 minutes @300  mm Hg Complications- None  Condition-PACU - hemodynamically stable.   Brief Clinical Note  [name] is a 78 y.o. year old female with end stage OA of her left knee with progressively worsening pain and dysfunction. She has constant pain, with activity and at rest and significant functional deficits with difficulties even with ADLs. She has had extensive non-op management including analgesics, injections of cortisone and viscosupplements, and home exercise program, but remains in significant pain with significant dysfunction. Radiographs show bone on bone arthritis medial and patellofemoral with varus deformity. She presents now for left Total Knee Arthroplasty.    Procedure in detail---   The patient is brought into the operating room and positioned supine on the operating table. After successful administration of  Spinal,   a tourniquet is placed high on the  Left thigh(s) and the lower extremity is prepped and draped in the usual sterile fashion. Time out is performed by the operating team and then the  Left lower extremity is wrapped in Esmarch, knee flexed and the tourniquet inflated to 300 mmHg.       A midline incision is made with a ten blade through the subcutaneous tissue to the level of the extensor mechanism. A fresh blade is used to make a medial parapatellar arthrotomy. Soft tissue over the proximal medial tibia is subperiosteally elevated to the joint line with a knife and into the semimembranosus bursa with a Cobb elevator. Soft tissue over the proximal lateral tibia is elevated with attention being paid to  avoiding the patellar tendon on the tibial tubercle. The patella is everted, knee flexed 90 degrees and the ACL and PCL are removed. Findings are bone on bone medial and patellofemoral with large medial osteophytes.        The drill is used to create a starting hole in the distal femur and the canal is thoroughly irrigated with sterile saline to remove the fatty contents. The 5 degree Left  valgus alignment guide is placed into the femoral canal and the distal femoral cutting block is pinned to remove 10 mm off the distal femur. Resection is made with an oscillating saw.      The tibia is subluxed forward and the menisci are removed. The extramedullary alignment guide is placed referencing proximally at the medial aspect of the tibial tubercle and distally along the second metatarsal axis and tibial crest. The block is pinned to remove 2 mm off the more deficient medial  side. Resection is made with an oscillating saw. Size 2.5 is the most appropriate size for the tibia and the proximal tibia is prepared with the modular drill and keel punch for that size.      The femoral sizing guide is placed and size 3 is most appropriate. Rotation is marked off the epicondylar axis and confirmed by creating a rectangular flexion gap at 90 degrees. The size 3 cutting block is pinned in this rotation and the anterior, posterior and chamfer cuts are made with the oscillating saw. The intercondylar block is then placed and that cut is made.      Trial size 2.5 tibial component, trial size 3 posterior stabilized femur  and a 12.5  mm posterior stabilized rotating platform insert trial is placed. Full extension is achieved with excellent varus/valgus and anterior/posterior balance throughout full range of motion. The patella is everted and thickness measured to be 22  mm. Free hand resection is taken to 12 mm, a 38 template is placed, lug holes are drilled, trial patella is placed, and it tracks normally. Osteophytes are removed  off the posterior femur with the trial in place. All trials are removed and the cut bone surfaces prepared with pulsatile lavage. Cement is mixed and once ready for implantation, the size 2.5 tibial implant, size  3 posterior stabilized femoral component, and the size 38 patella are cemented in place and the patella is held with the clamp. The trial insert is placed and the knee held in full extension. All extruded cement is removed and once the cement is hard the permanent 12.5 mm posterior stabilized rotating platform insert is placed into the tibial tray.      The wound is copiously irrigated with saline solution and the extensor mechanism closed over a hemovac drain with #1 PDS suture. The tourniquet is released for a total tourniquet time of 36  minutes. Flexion against gravity is 140 degrees and the patella tracks normally. Subcutaneous tissue is closed with 2.0 vicryl and subcuticular with running 4.0 Monocryl. The catheter for the Marcaine pain pump is placed and the pump is initiated. The incision is cleaned and dried and steri-strips and a bulky sterile dressing are applied. The limb is placed into a knee immobilizer.      After a sterile prep with Betadine, I then injected the right knee with 80mg  Depomedrol with no problems. The patient is then  awakened and transported to recovery in stable condition.        Krista Rankin Negar Sieler, MD    03/13/2012, 4:05 PM

## 2012-03-13 NOTE — Anesthesia Preprocedure Evaluation (Addendum)
Anesthesia Evaluation  Patient identified by MRN, date of birth, ID band Patient awake  General Assessment Comment:Left shoulder injury/fracture 10/28/11  Reviewed: Allergy & Precautions, H&P , NPO status , Patient's Chart, lab work & pertinent test results  Airway Mallampati: II TM Distance: >3 FB Neck ROM: Full    Dental No notable dental hx.    Pulmonary neg pulmonary ROS,  breath sounds clear to auscultation  Pulmonary exam normal       Cardiovascular negative cardio ROS  Rhythm:Regular Rate:Normal     Neuro/Psych negative neurological ROS  negative psych ROS   GI/Hepatic Neg liver ROS, hiatal hernia, GERD-  Medicated,  Endo/Other  negative endocrine ROS  Renal/GU negative Renal ROS  negative genitourinary   Musculoskeletal negative musculoskeletal ROS (+)   Abdominal   Peds negative pediatric ROS (+)  Hematology negative hematology ROS (+)   Anesthesia Other Findings   Reproductive/Obstetrics negative OB ROS                          Anesthesia Physical Anesthesia Plan  ASA: II  Anesthesia Plan: Spinal   Post-op Pain Management:    Induction: Intravenous  Airway Management Planned:   Additional Equipment:   Intra-op Plan:   Post-operative Plan: Extubation in OR  Informed Consent: I have reviewed the patients History and Physical, chart, labs and discussed the procedure including the risks, benefits and alternatives for the proposed anesthesia with the patient or authorized representative who has indicated his/her understanding and acceptance.   Dental advisory given  Plan Discussed with: CRNA  Anesthesia Plan Comments: (Discussed r/b/a general versus spinal. Discussed risks/benefits of spinal including headache, backache, failure, bleeding, infection, and nerve damage. Patient consents to spinal. Questions answered. Coagulation studies and platelet count acceptable. )        Anesthesia Quick Evaluation

## 2012-03-13 NOTE — Anesthesia Postprocedure Evaluation (Deleted)
  Anesthesia Post-op Note  Patient: [name]  Procedure(s) Performed: Procedure(s) (LRB): TOTAL KNEE ARTHROPLASTY (Left)  Patient Location: PACU  Anesthesia Type: General  Level of Consciousness: awake and alert   Airway and Oxygen Therapy: Patient Spontanous Breathing  Post-op Pain: mild  Post-op Assessment: Post-op Vital signs reviewed, Patient's Cardiovascular Status Stable, Respiratory Function Stable, Patent Airway and No signs of Nausea or vomiting  Post-op Vital Signs: stable  Complications: No apparent anesthesia complications

## 2012-03-13 NOTE — Interval H&P Note (Signed)
History and Physical Interval Note:  03/13/2012 2:22 PM  NUR RABOLD  has presented today for surgery, with the diagnosis of osteoarthritis left knee  The various methods of treatment have been discussed with the patient and family. After consideration of risks, benefits and other options for treatment, the patient has consented to  Procedure(s) (LRB): TOTAL KNEE ARTHROPLASTY (Left) as a surgical intervention .  The patient's history has been reviewed, patient examined, no change in status, stable for surgery.  I have reviewed the patient's chart and labs.  Questions were answered to the patient's satisfaction.     Loanne Drilling

## 2012-03-13 NOTE — H&P (View-Only) (Signed)
[name]  DOB: 31-Oct-1933 Married / Language: English / Race: White Female  Date of Admission:  03/13/2012  Chief complaint:  Left greater than right knee pain  History of Present Illness The patient is a 78 year old female who comes in for a preoperative History and Physical. The patient is scheduled for a left total knee arthroplasty to be performed by Dr. Gus Rankin. Aluisio, MD at [facility] on 03/13/2012. The patient is a 78 year old female who presents for a recheck of her Knee. The patient is being followed for their bilateral knee pain and osteoarthritis. Symptoms reported today include: pain, swelling and aching. The patient feels that they are doing poorly. Current treatment includes: NSAIDs (Aleve). The patient presents today following Synvisc series. The patient has not gotten any relief of their symptoms with viscosupplementation. The Visco supplements did not help. Her left knee is worse than her right. She feels like she is having a hard time getting around. The knee is limiting what she can and cannot do. Pain is occurring with most, if not all activities. She does have pain at night. She would also like a Right Knee Cortisone Injection at the time of surgery.  She is ready to get the left knee replaced. They have been treated conservatively in the past for the above stated problem and despite conservative measures, they continue to have progressive pain and severe functional limitations and dysfunction. They have failed non-operative management including home exercise, medications, and injections. It is felt that they would benefit from undergoing total joint replacement. Risks and benefits of the procedure have been discussed with the patient and they elect to proceed with surgery. There are no active contraindications to surgery such as ongoing infection or rapidly progressive neurological disease.   Problem List/Past Medical Fracture, Proximal  Humerus, closed (812.09) S/P arthroscopy of knee (V45.89) Sprain/Strain, Wrist (842.00) Essential tremor (333.1) Osteoarthrosis NOS, lower leg (715.96). 11/03/2010 Tear, medial meniscus, knee, current (836.0). 12/20/2010   Allergies Sulfa Drugs. Rash.   Family History Cancer. grandfather mothers side Cerebrovascular Accident. mother Kidney disease. father   Social History Children. 4 Current work status. retired Financial planner (Currently). no Alcohol use. current drinker; drinks wine; 8-14 per week Drug/Alcohol Rehab (Previously). no Exercise. Exercises rarely Illicit drug use. no Living situation. live alone Marital status. widowed Number of flights of stairs before winded. 1 Tobacco use. Never smoker. never smoker Tobacco / smoke exposure. no Pain Contract. no Post-Surgical Plans. Plan is to go to Rehab after the hopsital stay.   Medication History Primidone (50MG  Tablet, 1/2 tablet Oral three times daily) Active. NexIUM (40MG  Capsule DR, Oral daily) Active. Diazepam (5MG  Tablet, Oral two times daily) Active.   Pregnancy / Birth History Pregnant. no   Past Surgical History Arthroscopy of Knee. bilateral Foot Surgery. right   Osteoarthritis Gastroesophageal Reflux Disease   Review of Systems General:Not Present- Chills, Fever, Night Sweats, Fatigue, Weight Gain, Weight Loss and Memory Loss. Skin:Not Present- Hives, Itching, Rash, Eczema and Lesions. HEENT:Not Present- Tinnitus, Headache, Double Vision, Visual Loss, Hearing Loss and Dentures. Respiratory:Not Present- Shortness of breath with exertion, Shortness of breath at rest, Allergies, Coughing up blood and Chronic Cough. Cardiovascular:Not Present- Chest Pain, Racing/skipping heartbeats, Difficulty Breathing Lying Down, Murmur, Swelling and Palpitations. Gastrointestinal:Not Present- Bloody Stool, Heartburn, Abdominal Pain, Vomiting, Nausea, Constipation,  Diarrhea, Difficulty Swallowing, Jaundice and Loss of appetitie. Female Genitourinary:Not Present- Blood in Urine, Urinary frequency, Weak urinary stream, Discharge, Flank Pain, Incontinence, Painful Urination, Urgency,  Urinary Retention and Urinating at Night. Musculoskeletal:Not Present- Muscle Weakness, Muscle Pain, Joint Swelling, Joint Pain, Back Pain, Morning Stiffness and Spasms. Neurological:Not Present- Tremor, Dizziness, Blackout spells, Paralysis, Difficulty with balance and Weakness. Psychiatric:Not Present- Insomnia.   Vitals Weight: 160 lb Height: 64 in Body Surface Area: 1.81 m Body Mass Index: 27.46 kg/m Pulse: 64 (Regular) Resp.: 14 (Unlabored) BP: 156/80 (Sitting, Right Arm, Standard)  Physical Exam The physical exam findings are as follows:  Note: Patient is a 78 year old female with continued bilateral knee pain.   General Mental Status - Alert, cooperative and good historian. General Appearance- pleasant. Not in acute distress. Orientation- Oriented X3. Build & Nutrition- Well nourished and Well developed.   Head and Neck Head- normocephalic, atraumatic . Neck Global Assessment- supple. no bruit auscultated on the right and no bruit auscultated on the left.   Eye Pupil- Bilateral- Regular and Round. Note: Wears glasses Motion- Bilateral- EOMI. wears glasses  Chest and Lung Exam Auscultation: Breath sounds:- clear at anterior chest wall and - clear at posterior chest wall. Adventitious sounds:- No Adventitious sounds.   Cardiovascular Auscultation:Rhythm- Regular rate and rhythm. Heart Sounds- S1 WNL and S2 WNL. Murmurs & Other Heart Sounds:Auscultation of the heart reveals - No Murmurs.   Abdomen Palpation/Percussion:Tenderness- Abdomen is non-tender to palpation. Rigidity (guarding)- Abdomen is soft. Auscultation:Auscultation of the abdomen reveals - Bowel sounds normal.   Female  Genitourinary Not done, not pertinent to present illness  Musculoskeletal On exam she is alert and oriented in no apparent distress. Her left knee shows no swelling. Her range is about 5 to 120. There is moderate crepitus on range of motion. She has a varus deformity. She is tender medial greater than lateral. There is no instability noted. The right knee, no effusion. Range 0 to 125. Moderate crepitus on range of motion. Tender medial greater than lateral. No instability.  RADIOGRAPHS: AP both knees and lateral. The arthritis is progressing significantly. The left knee, she is now bone on bone medial and patellofemoral, worse now on the left knee. She has bony erosions and varus deformity. She has large medial osteophytes. Her right knee has now progressed to bone on bone medial. She is not as bad on the right as the left, but has progressed significantly in the past year.  Assessment & Plan Osteoarthrosis NOS, lower leg (715.96) Impression: Left greater than Right Knee  Note: Patient is for a Left Total Knee Repalcement and also a Right Knee Cortisone Injection by Dr. Lequita Halt.  Plan is to go to Rehab after the hospital stay.  PCP - Dr. Tracey Harries  Signed electronically by Roberts Gaudy, PA-C

## 2012-03-13 NOTE — Anesthesia Postprocedure Evaluation (Signed)
  Anesthesia Post-op Note  Patient: [name]  Procedure(s) Performed: Procedure(s) (LRB): TOTAL KNEE ARTHROPLASTY (Left)  Patient Location: PACU  Anesthesia Type: Spinal  Level of Consciousness: awake and alert   Airway and Oxygen Therapy: Patient Spontanous Breathing  Post-op Pain: mild  Post-op Assessment: Post-op Vital signs reviewed, Patient's Cardiovascular Status Stable, Respiratory Function Stable, Patent Airway and No signs of Nausea or vomiting  Post-op Vital Signs: stable  Complications: No apparent anesthesia complications

## 2012-03-13 NOTE — Transfer of Care (Signed)
Immediate Anesthesia Transfer of Care Note  Patient: [name]  Procedure(s) Performed: Procedure(s) (LRB): TOTAL KNEE ARTHROPLASTY (Left)  Patient Location: PACU  Anesthesia Type: Spinal  Level of Consciousness: awake, alert , oriented, patient cooperative and responds to stimulation  Airway & Oxygen Therapy: Patient Spontanous Breathing and Patient connected to face mask oxygen  Post-op Assessment: Report given to PACU RN and Post -op Vital signs reviewed and stable  Post vital signs: stable  Complications: No apparent anesthesia complications

## 2012-03-14 ENCOUNTER — Encounter (HOSPITAL_COMMUNITY): Payer: Self-pay | Admitting: Orthopedic Surgery

## 2012-03-14 DIAGNOSIS — E871 Hypo-osmolality and hyponatremia: Secondary | ICD-10-CM | POA: Diagnosis not present

## 2012-03-14 LAB — BASIC METABOLIC PANEL
CO2: 26 mEq/L (ref 19–32)
Glucose, Bld: 163 mg/dL — ABNORMAL HIGH (ref 70–99)
Potassium: 4.6 mEq/L (ref 3.5–5.1)
Sodium: 134 mEq/L — ABNORMAL LOW (ref 135–145)

## 2012-03-14 LAB — CBC
Hemoglobin: 11.9 g/dL — ABNORMAL LOW (ref 12.0–15.0)
MCH: 31.6 pg (ref 26.0–34.0)
RBC: 3.77 MIL/uL — ABNORMAL LOW (ref 3.87–5.11)

## 2012-03-14 MED ORDER — ESOMEPRAZOLE MAGNESIUM 40 MG PO CPDR
40.0000 mg | DELAYED_RELEASE_CAPSULE | Freq: Every day | ORAL | Status: DC
  Administered 2012-03-14 – 2012-03-16 (×3): 40 mg via ORAL
  Filled 2012-03-14 (×3): qty 1

## 2012-03-14 MED ORDER — NON FORMULARY: 40.0000 mg | Freq: Every day | Status: DC

## 2012-03-14 NOTE — Progress Notes (Signed)
   Subjective: 1 Day Post-Op Procedure(s) (LRB): TOTAL KNEE ARTHROPLASTY (Left) Patient reports pain as mild.   Patient seen in rounds by Dr. Lequita Halt. Patient is well, and has had no acute complaints or problems We will start therapy today.  Plan is to go Skilled nursing facility after hospital stay.  Objective: Vital signs in last 24 hours: Temp:  [97.5 F (36.4 C)-98.6 F (37 C)] 97.7 F (36.5 C) (08/29 1018) Pulse Rate:  [57-96] 78  (08/29 1018) Resp:  [10-18] 14  (08/29 1018) BP: (102-151)/(59-80) 102/66 mmHg (08/29 1018) SpO2:  [94 %-100 %] 95 % (08/29 1018) Weight:  [76 kg (167 lb 8.8 oz)] 76 kg (167 lb 8.8 oz) (08/28 2030)  Intake/Output from previous day:  Intake/Output Summary (Last 24 hours) at 03/14/12 1021 Last data filed at 03/14/12 0825  Gross per 24 hour  Intake 3413.75 ml  Output   2305 ml  Net 1108.75 ml    Intake/Output this shift: Total I/O In: 360 [P.O.:360] Out: -   Labs:  Basename 03/14/12 0424  HGB 11.9*    Basename 03/14/12 0424  WBC 8.4  RBC 3.77*  HCT 34.8*  PLT 191    Basename 03/14/12 0424  NA 134*  K 4.6  CL 102  CO2 26  BUN 12  CREATININE 0.78  GLUCOSE 163*  CALCIUM 8.6   No results found for this basename: LABPT:2,INR:2 in the last 72 hours  EXAM General - Patient is Alert, Appropriate and Oriented Extremity - Neurovascular intact Sensation intact distally Dorsiflexion/Plantar flexion intact Dressing - dressing C/D/I Motor Function - intact, moving foot and toes well on exam.  Hemovac pulled without difficulty.  Past Medical History  Diagnosis Date  . Hx of colonic polyps   . Fracture of humerus, proximal, left, closed October 28, 2011    pt has finished physical therapy-limited ROM reaching to her back and unable to lift heavy things with left hand/arm  . Essential tremor     of head, sometimes hands.  pt takes primidone to help the tremors-DR. MILLER-NEUROLOGIST IN [place].  PT WAS STARTED ON VALUIM MANY  YEARS AGO FOR HER TREMORS--AND CONTINUES TO TAKE.  . Osteoarthritis     PAIN AND OA BILATERAL KNEES; ALSO HAS ARTHRITIS IN HANDS AND BACK BUT NO BACK PAIN  . GERD (gastroesophageal reflux disease)   . H/O hiatal hernia     Assessment/Plan: 1 Day Post-Op Procedure(s) (LRB): TOTAL KNEE ARTHROPLASTY (Left) Principal Problem:  *OA (osteoarthritis) of knee Active Problems:  Postop Hyponatremia   Advance diet Up with therapy Discharge to SNF  DVT Prophylaxis - Xarelto Weight-Bearing as tolerated to left leg No vaccines. D/C O2 and Pulse OX and try on Room Air  PERKINS, Marlowe Sax 03/14/2012, 10:21 AM

## 2012-03-14 NOTE — Progress Notes (Signed)
Clinical Social Work Department CLINICAL SOCIAL WORK PLACEMENT NOTE 03/14/2012  Patient:  [name], [name]  Account Number:  [id number] Admit date:  03/13/2012  Clinical Social Worker:  Cori Razor, LCSW  Date/time:  03/14/2012 12:01 PM  Clinical Social Work is seeking post-discharge placement for this patient at the following level of care:   SKILLED NURSING   (*CSW will update this form in Epic as items are completed)     Patient/family provided with [facility] Department of Clinical Social Work's list of facilities offering this level of care within the geographic area requested by the patient (or if unable, by the patient's family).    Patient/family informed of their freedom to choose among providers that offer the needed level of care, that participate in Medicare, Medicaid or managed care program needed by the patient, have an available bed and are willing to accept the patient.    Patient/family informed of [facility]' ownership interest in [facility], as well as of the fact that they are under no obligation to receive care at this facility.  PASARR submitted to EDS on  PASARR number received from EDS on   FL2 transmitted to all facilities in geographic area requested by pt/family on  03/14/2012 FL2 transmitted to all facilities within larger geographic area on   Patient informed that his/her managed care company has contracts with or will negotiate with  certain facilities, including the following:     Patient/family informed of bed offers received:  03/14/2012 Patient chooses bed at [facility] Physician recommends and patient chooses bed at    Patient to be transferred to [facility] on   Patient to be transferred to facility by   The following physician request were entered in Epic:   Additional Comments:  Cori Razor LCSW [phone]

## 2012-03-14 NOTE — Progress Notes (Signed)
Clinical Social Work Department BRIEF PSYCHOSOCIAL ASSESSMENT 03/14/2012  Patient:  [name], [name]     Account Number:  [id number]     Admit date:  03/13/2012  Clinical Social Worker:  Candie Chroman  Date/Time:  03/14/2012 11:53 AM  Referred by:  Physician  Date Referred:  03/14/2012 Referred for  SNF Placement   Other Referral:   Interview type:  Patient Other interview type:    PSYCHOSOCIAL DATA Living Status:  ALONE Admitted from facility:   Level of care:   Primary support name:  Quinn Plowman Primary support relationship to patient:  CHILD, ADULT Degree of support available:   unclear    CURRENT CONCERNS Current Concerns  Post-Acute Placement   Other Concerns:    SOCIAL WORK ASSESSMENT / PLAN Pt is a 52 yr. old female living at home prior to hospitalization. CSW met with pt to assist with d/c planning. Pt has made pprior arrangements to have ST Rehab at [facility] following hospital d/c. CSW has contacted SNF and plan has been confirmed.  CSW will follow to assist with d/c planning to SNF.   Assessment/plan status:  Psychosocial Support/Ongoing Assessment of Needs Other assessment/ plan:   Information/referral to community resources:   None needed at this time.    PATIENT'S/FAMILY'S RESPONSE TO PLAN OF CARE: Pt is looking forward to feeling better and having rehab at [facility].    Cori Razor LCSW [phone]

## 2012-03-14 NOTE — Evaluation (Signed)
Physical Therapy Evaluation Patient Details Name: [name] MRN: [id number] DOB: 1934/06/09 Today's Date: 03/14/2012 Time: 4098-1191 PT Time Calculation (min): 23 min  PT Assessment / Plan / Recommendation Clinical Impression  Pt s/p L TKR with hx of recent L humerus fx with limited ROM.  Pt would benefit from acute PT services in order to improve independence with transfers and ambulation by increasing L LE strength and ROM to prepare for d/c to SNF.    PT Assessment  Patient needs continued PT services    Follow Up Recommendations  Skilled nursing facility    Barriers to Discharge        Equipment Recommendations  3 in 1 bedside comode    Recommendations for Other Services     Frequency 7X/week    Precautions / Restrictions Precautions Precautions: Knee Required Braces or Orthoses: Knee Immobilizer - Left Knee Immobilizer - Left: Discontinue once straight leg raise with < 10 degree lag Restrictions LLE Weight Bearing: Weight bearing as tolerated   Pertinent Vitals/Pain No pain at rest, increased to 4/10 L knee pain with ambulation      Mobility  Bed Mobility Bed Mobility: Supine to Sit Supine to Sit: 4: Min assist Details for Bed Mobility Assistance: assist for L LE, pt given therapist arm to pull with R UE to assist with transfer as pt not able to use L UE. Transfers Transfers: Stand to Sit;Sit to Stand Sit to Stand: 4: Min assist;With upper extremity assist;From bed;From elevated surface Stand to Sit: 4: Min assist;With upper extremity assist;To chair/3-in-1 Details for Transfer Assistance: verbal cues for safe technique Ambulation/Gait Ambulation/Gait Assistance: 4: Min guard Ambulation Distance (Feet): 14 Feet Assistive device: Rolling walker Ambulation/Gait Assistance Details: verbal cues for sequence and safe use of RW, limited distance due to dizziness and nausea, 4/10 pain reported L knee with ambulation Gait Pattern: Step-to pattern;Decreased stance  time - left;Antalgic    Exercises     PT Diagnosis: Difficulty walking;Acute pain  PT Problem List: Decreased strength;Decreased range of motion;Decreased activity tolerance;Decreased mobility;Pain;Decreased knowledge of use of DME PT Treatment Interventions: DME instruction;Gait training;Stair training;Functional mobility training;Therapeutic activities;Therapeutic exercise;Patient/family education   PT Goals Acute Rehab PT Goals PT Goal Formulation: With patient Time For Goal Achievement: 03/21/12 Potential to Achieve Goals: Good Pt will go Supine/Side to Sit: with supervision PT Goal: Supine/Side to Sit - Progress: Goal set today Pt will go Sit to Supine/Side: with supervision PT Goal: Sit to Supine/Side - Progress: Goal set today Pt will go Sit to Stand: with supervision PT Goal: Sit to Stand - Progress: Goal set today Pt will go Stand to Sit: with supervision PT Goal: Stand to Sit - Progress: Goal set today Pt will Ambulate: 51 - 150 feet;with supervision;with least restrictive assistive device PT Goal: Ambulate - Progress: Goal set today Pt will Perform Home Exercise Program: with supervision, verbal cues required/provided PT Goal: Perform Home Exercise Program - Progress: Goal set today  Visit Information  Last PT Received On: 03/14/12 Assistance Needed: +1    Subjective Data  Subjective: I'm feeling nauseated.  (with ambulation)   Prior Functioning  Home Living Lives With: Alone Available Help at Discharge: Skilled Nursing Facility Home Adaptive Equipment: Walker - rolling Prior Function Level of Independence: Independent with assistive device(s) Communication Communication: No difficulties    Cognition  Overall Cognitive Status: Appears within functional limits for tasks assessed/performed Arousal/Alertness: Awake/alert Orientation Level: Appears intact for tasks assessed Behavior During Session: [facility] for tasks performed  Extremity/Trunk Assessment Right  Upper Extremity Assessment RUE ROM/Strength/Tone: [facility] for tasks assessed Left Upper Extremity Assessment LUE ROM/Strength/Tone: Deficits LUE ROM/Strength/Tone Deficits: pt with recent humerus fx and finished PT however still has ROM deficits with flexion and abduction beyond 90* and no ability to internally rotate, also reports able to WB through L UE put no lifting or pulling Right Lower Extremity Assessment RLE ROM/Strength/Tone: [facility] for tasks assessed Left Lower Extremity Assessment LLE ROM/Strength/Tone: Deficits LLE ROM/Strength/Tone Deficits: fair quad contraction, maintained KI, unable to perform SLR   Balance    End of Session PT - End of Session Equipment Utilized During Treatment: Gait belt;Left knee immobilizer Activity Tolerance: Other (comment) (limited by nausea) Patient left: in chair;with call bell/phone within reach;with nursing in room CPM Left Knee CPM Left Knee: Off  GP     [name],[name] 03/14/2012, 11:46 AM Pager: [phone]

## 2012-03-14 NOTE — Progress Notes (Signed)
Utilization review completed.  

## 2012-03-14 NOTE — Progress Notes (Signed)
Physical Therapy Treatment Note   03/14/12 1500  PT Visit Information  Last PT Received On 03/14/12  Assistance Needed +1  PT Time Calculation  PT Start Time 1411  PT Stop Time 1443  PT Time Calculation (min) 32 min  Subjective Data  Subjective I'm feeling better.  Precautions  Precautions Knee  Required Braces or Orthoses Knee Immobilizer - Left  Knee Immobilizer - Left Discontinue once straight leg raise with < 10 degree lag  Restrictions  LLE Weight Bearing WBAT  Cognition  Overall Cognitive Status Appears within functional limits for tasks assessed/performed  Bed Mobility  Bed Mobility Sit to Supine  Sit to Supine 4: Min assist  Details for Bed Mobility Assistance assist for L LE, verbal cues for technique  Transfers  Transfers Stand to Sit;Sit to Stand  Sit to Stand 4: Min assist;With upper extremity assist;From chair/3-in-1  Stand to Sit 4: Min guard;With upper extremity assist;To bed;To elevated surface  Details for Transfer Assistance verbal cues for safe technique, assist to steady on rise  Ambulation/Gait  Ambulation/Gait Assistance 4: Min guard  Ambulation Distance (Feet) 25 Feet  Assistive device Rolling walker  Ambulation/Gait Assistance Details verbal cues for sequence and posture, pt continues to report dizziness but able to continue and states it feels better than this morning  Gait Pattern Step-to pattern;Decreased stance time - left;Antalgic  Gait velocity decreased  Exercises  Exercises Total Joint  Total Joint Exercises  Ankle Circles/Pumps AROM;Both;20 reps  Quad Sets AROM;Both;20 reps  Short Arc [place];Left;15 reps  Heel Slides AAROM;Left;20 reps  Hip ABduction/ADduction AROM;Strengthening;Left;20 reps  Goniometric ROM L knee AAROM -3-85* with exercises  PT - End of Session  Equipment Utilized During Treatment Left knee immobilizer  Activity Tolerance Patient tolerated treatment well  Patient left in bed;with call bell/phone within reach;with  family/visitor present  PT - Assessment/Plan  Comments on Treatment Session Pt performed exercises and able to ambulate again this afternoon.  Pt continues to report dizziness with ambulation however reports dizziness is better than this morning.  PT Plan Discharge plan remains appropriate;Frequency remains appropriate  Follow Up Recommendations Skilled nursing facility  Equipment Recommended 3 in 1 bedside comode  Acute Rehab PT Goals  PT Goal: Sit to Supine/Side - Progress Progressing toward goal  PT Goal: Sit to Stand - Progress Progressing toward goal  PT Goal: Stand to Sit - Progress Progressing toward goal  PT Goal: Ambulate - Progress Progressing toward goal  PT Goal: Perform Home Exercise Program - Progress Progressing toward goal  PT General Charges  $$ ACUTE PT VISIT 1 Procedure  PT Treatments  $Gait Training 8-22 mins  $Therapeutic Exercise 8-22 mins    Zenovia Jarred, PT Pager: [phone]

## 2012-03-15 DIAGNOSIS — D62 Acute posthemorrhagic anemia: Secondary | ICD-10-CM | POA: Diagnosis not present

## 2012-03-15 LAB — CBC
HCT: 27.5 % — ABNORMAL LOW (ref 36.0–46.0)
MCH: 32.1 pg (ref 26.0–34.0)
MCV: 92.9 fL (ref 78.0–100.0)
Platelets: 130 10*3/uL — ABNORMAL LOW (ref 150–400)
RDW: 13 % (ref 11.5–15.5)

## 2012-03-15 LAB — BASIC METABOLIC PANEL
BUN: 15 mg/dL (ref 6–23)
Calcium: 8.2 mg/dL — ABNORMAL LOW (ref 8.4–10.5)
Creatinine, Ser: 0.79 mg/dL (ref 0.50–1.10)
GFR calc Af Amer: >90 mL/min (ref >90)

## 2012-03-15 MED ORDER — OXYCODONE HCL 5 MG PO TABS: 5.0000 mg | ORAL_TABLET | ORAL | Status: AC | PRN

## 2012-03-15 MED ORDER — METHOCARBAMOL 500 MG PO TABS: 500.0000 mg | ORAL_TABLET | Freq: Four times a day (QID) | ORAL | Status: AC | PRN

## 2012-03-15 MED ORDER — POLYSACCHARIDE IRON COMPLEX 150 MG PO CAPS
150.0000 mg | ORAL_CAPSULE | Freq: Every day | ORAL | Status: DC
  Administered 2012-03-16: 150 mg via ORAL
  Filled 2012-03-15 (×2): qty 1

## 2012-03-15 MED ORDER — DSS 100 MG PO CAPS: 100.0000 mg | ORAL_CAPSULE | Freq: Two times a day (BID) | ORAL | Status: AC

## 2012-03-15 MED ORDER — POLYSACCHARIDE IRON COMPLEX 150 MG PO CAPS: 150.0000 mg | ORAL_CAPSULE | Freq: Two times a day (BID) | ORAL | Status: DC

## 2012-03-15 MED ORDER — ACETAMINOPHEN 325 MG PO TABS: 650.0000 mg | ORAL_TABLET | Freq: Four times a day (QID) | ORAL | Status: AC | PRN

## 2012-03-15 MED ORDER — BISACODYL 10 MG RE SUPP: 10.0000 mg | Freq: Every day | RECTAL | Status: AC | PRN

## 2012-03-15 MED ORDER — RIVAROXABAN 10 MG PO TABS: 10.0000 mg | ORAL_TABLET | Freq: Every day | ORAL | Status: DC

## 2012-03-15 NOTE — Discharge Summary (Signed)
Physician Discharge Summary   Patient ID: [name] MRN: [id number] DOB/AGE: 1934/06/28 78 y.o.  Admit date: 03/13/2012 Discharge date: Tentative Date of Discharge 03/16/2012  Primary Diagnosis: Osteoarthritis Bilateral knee   Admission Diagnoses:  Past Medical History  Diagnosis Date  . Hx of colonic polyps   . Fracture of humerus, proximal, left, closed October 28, 2011    pt has finished physical therapy-limited ROM reaching to her back and unable to lift heavy things with left hand/arm  . Essential tremor     of head, sometimes hands.  pt takes primidone to help the tremors-DR. MILLER-NEUROLOGIST IN [place].  PT WAS STARTED ON VALUIM MANY YEARS AGO FOR HER TREMORS--AND CONTINUES TO TAKE.  . Osteoarthritis     PAIN AND OA BILATERAL KNEES; ALSO HAS ARTHRITIS IN HANDS AND BACK BUT NO BACK PAIN  . GERD (gastroesophageal reflux disease)   . H/O hiatal hernia    Discharge Diagnoses:   Principal Problem:  *OA (osteoarthritis) of knee Active Problems:  Postop Hyponatremia  Postop Acute blood loss anemia  Procedure:  Procedure(s) (LRB): TOTAL KNEE ARTHROPLASTY (Left)   Consults: None  HPI: [name] is a 78 y.o. year old female with end stage OA of her left knee with progressively worsening pain and dysfunction. She has constant pain, with activity and at rest and significant functional deficits with difficulties even with ADLs. She has had extensive non-op management including analgesics, injections of cortisone and viscosupplements, and home exercise program, but remains in significant pain with significant dysfunction. Radiographs show bone on bone arthritis medial and patellofemoral with varus deformity. She presents now for left Total Knee Arthroplasty.   Laboratory Data: Hospital Outpatient Visit on 03/08/2012  Component Date Value Range Status  . MRSA, PCR 03/08/2012 NEGATIVE  NEGATIVE Final  . Staphylococcus aureus 03/08/2012 NEGATIVE  NEGATIVE Final   Comment:                                 The Xpert SA Assay (FDA                          approved for NASAL specimens                          in patients over 76 years of age),                          is one component of                          a comprehensive surveillance                          program.  Test performance has                          been validated by [employer] for patients greater                          than or equal to 62 year old.  It is not intended                          to diagnose infection nor to                          guide or monitor treatment.  Marland Kitchen aPTT 03/08/2012 33  24 - 37 seconds Final  . WBC 03/08/2012 7.0  4.0 - 10.5 K/uL Final  . RBC 03/08/2012 4.67  3.87 - 5.11 MIL/uL Final  . Hemoglobin 03/08/2012 14.9  12.0 - 15.0 g/dL Final  . HCT 16/04/9603 43.7  36.0 - 46.0 % Final  . MCV 03/08/2012 93.6  78.0 - 100.0 fL Final  . MCH 03/08/2012 31.9  26.0 - 34.0 pg Final  . MCHC 03/08/2012 34.1  30.0 - 36.0 g/dL Final  . RDW 54/03/8118 12.9  11.5 - 15.5 % Final  . Platelets 03/08/2012 198  150 - 400 K/uL Final  . Sodium 03/08/2012 138  135 - 145 mEq/L Final  . Potassium 03/08/2012 4.1  3.5 - 5.1 mEq/L Final  . Chloride 03/08/2012 101  96 - 112 mEq/L Final  . CO2 03/08/2012 29  19 - 32 mEq/L Final  . Glucose, Bld 03/08/2012 89  70 - 99 mg/dL Final  . BUN 14/78/2956 16  6 - 23 mg/dL Final  . Creatinine, Ser 03/08/2012 0.80  0.50 - 1.10 mg/dL Final  . Calcium 21/30/8657 9.8  8.4 - 10.5 mg/dL Final  . Total Protein 03/08/2012 7.6  6.0 - 8.3 g/dL Final  . Albumin 84/69/6295 4.3  3.5 - 5.2 g/dL Final  . AST 28/41/3244 24  0 - 37 U/L Final  . ALT 03/08/2012 20  0 - 35 U/L Final  . Alkaline Phosphatase 03/08/2012 97  39 - 117 U/L Final  . Total Bilirubin 03/08/2012 0.7  0.3 - 1.2 mg/dL Final  . GFR calc non Af Amer 03/08/2012 70* >90 mL/min Final  . GFR calc Af Amer 03/08/2012 81* >90 mL/min Final    Comment:                                 The eGFR has been calculated                          using the CKD EPI equation.                          This calculation has not been                          validated in all clinical                          situations.                          eGFR's persistently                          <90 mL/min signify                          possible Chronic Kidney Disease.  Marland Kitchen Prothrombin Time 03/08/2012  12.8  11.6 - 15.2 seconds Final  . INR 03/08/2012 0.94  0.00 - 1.49 Final  . Color, Urine 03/08/2012 YELLOW  YELLOW Final  . APPearance 03/08/2012 CLEAR  CLEAR Final  . Specific Gravity, Urine 03/08/2012 1.015  1.005 - 1.030 Final  . pH 03/08/2012 5.5  5.0 - 8.0 Final  . Glucose, UA 03/08/2012 NEGATIVE  NEGATIVE mg/dL Final  . Hgb urine dipstick 03/08/2012 NEGATIVE  NEGATIVE Final  . Bilirubin Urine 03/08/2012 NEGATIVE  NEGATIVE Final  . Ketones, ur 03/08/2012 NEGATIVE  NEGATIVE mg/dL Final  . Protein, ur 16/04/9603 NEGATIVE  NEGATIVE mg/dL Final  . Urobilinogen, UA 03/08/2012 0.2  0.0 - 1.0 mg/dL Final  . Nitrite 54/03/8118 NEGATIVE  NEGATIVE Final  . Leukocytes, UA 03/08/2012 TRACE* NEGATIVE Final  . Squamous Epithelial / LPF 03/08/2012 RARE  RARE Final  . WBC, UA 03/08/2012 0-2  <3 WBC/hpf Final    Basename 03/15/12 0421 03/14/12 0424  HGB 9.5* 11.9*    Basename 03/15/12 0421 03/14/12 0424  WBC 6.3 8.4  RBC 2.96* 3.77*  HCT 27.5* 34.8*  PLT 130* 191    Basename 03/15/12 0421 03/14/12 0424  NA 134* 134*  K 4.4 4.6  CL 101 102  CO2 27 26  BUN 15 12  CREATININE 0.79 0.78  GLUCOSE 123* 163*  CALCIUM 8.2* 8.6   No results found for this basename: LABPT:2,INR:2 in the last 72 hours  X-Rays:No results found.  EKG: Orders placed during the hospital encounter of 03/01/11  . EKG     Hospital Course: Patient was admitted to [facility] and taken to the OR and underwent the above state procedure without complications.   Patient tolerated the procedure well and was later transferred to the recovery room and then to the orthopaedic floor for postoperative care.  They were given PO and IV analgesics for pain control following their surgery.  They were given 24 hours of postoperative antibiotics and started on DVT prophylaxis in the form of Xarelto.   PT and OT were ordered for total joint protocol.  Discharge planning consulted to help with postop disposition and equipment needs.  Patient had a decent night on the evening of surgery and started to get up OOB with therapy on day one.  Hemovac drain was pulled without difficulty.  Continued to work with therapy into day two.  Dressing was changed on day two and the incision was healing well.   She was set up to got to [facility] on day three, Saturday 03/16/2012.  The patient had progressed with therapy when seen on day two and it was felt that she would be ready the next day.  Patient was seen in rounds and set up to go to [facility] Saturday.  Discharge Medications: Prior to Admission medications   Medication Sig Start Date End Date Taking? Authorizing Provider  diazepam (VALIUM) 5 MG tablet take 1 tablet by mouth twice a day 02/11/11  Yes Stacie Glaze, MD  NEXIUM 40 MG capsule TAKE 1 CAPSULE BY MOUTH ONCE DAILY 03/13/11  Yes Stacie Glaze, MD  primidone (MYSOLINE) 50 MG tablet Take 50 mg by mouth 2 (two) times daily.    Yes Historical Provider, MD  acetaminophen (TYLENOL) 325 MG tablet Take 2 tablets (650 mg total) by mouth every 6 (six) hours as needed (or Fever >/= 101). 03/15/12 03/15/13  Nannie Starzyk Julien Girt, PA  bisacodyl (DULCOLAX) 10 MG suppository Place 1 suppository (10 mg total) rectally daily as needed. 03/15/12 03/25/12  Elgie Maziarz Julien Girt, PA  Docusate Sodium (DSS) 100 MG CAPS Take 100 mg by mouth 2 (two) times daily. 03/15/12 03/25/12  Martha Ellerby, PA  hyoscyamine (LEVSIN) 0.125 MG tablet Take 0.125 mg by mouth every 4 (four) hours as needed. Cramping     Historical Provider, MD  iron polysaccharides (NIFEREX) 150 MG capsule Take 1 capsule (150 mg total) by mouth 2 (two) times daily. 03/15/12 03/15/13  Tillman Kazmierski, PA  methocarbamol (ROBAXIN) 500 MG tablet Take 1 tablet (500 mg total) by mouth every 6 (six) hours as needed. 03/15/12 03/25/12  Bear Osten, PA  oxyCODONE (OXY IR/ROXICODONE) 5 MG immediate release tablet Take 1-2 tablets (5-10 mg total) by mouth every 4 (four) hours as needed for pain. 03/15/12 03/25/12  Naomy Esham Julien Girt, PA  rivaroxaban (XARELTO) 10 MG TABS tablet Take 1 tablet (10 mg total) by mouth daily with breakfast. 03/15/12   Eaden Hettinger Julien Girt, PA    Diet: Cardiac diet Activity:WBAT Follow-up:in 2 weeks Disposition - Skilled nursing facility Discharged Condition: good   Discharge Orders    Future Orders Please Complete By Expires   Diet - low sodium heart healthy      Call MD / Call 911      Comments:   If you experience chest pain or shortness of breath, CALL 911 and be transported to the hospital emergency room.  If you develope a fever above 101 F, pus (white drainage) or increased drainage or redness at the wound, or calf pain, call your surgeon's office.   Discharge instructions      Comments:   Pick up stool softner and laxative for home. Do not submerge incision under water. May shower. Continue to use ice for pain and swelling from surgery..  Take Xarelto for two and a half more weeks, then discontinue Xarelto.   Constipation Prevention      Comments:   Drink plenty of fluids.  Prune juice may be helpful.  You may use a stool softener, such as Colace (over the counter) 100 mg twice a day.  Use MiraLax (over the counter) for constipation as needed.   Increase activity slowly as tolerated      Patient may shower      Comments:   You may shower without a dressing once there is no drainage.  Do not wash over the wound.  If drainage remains, do not shower until drainage stops.   Driving  restrictions      Comments:   No driving until released by the physician.   Lifting restrictions      Comments:   No lifting until released by the physician.   TED hose      Comments:   Use stockings (TED hose) for 3 weeks on both leg(s).  You may remove them at night for sleeping.   Change dressing      Comments:   Change dressing daily with sterile 4 x 4 inch gauze dressing and apply TED hose. Do not submerge the incision under water.   Do not put a pillow under the knee. Place it under the heel.      Do not sit on low chairs, stoools or toilet seats, as it may be difficult to get up from low surfaces        Medication List  As of 03/15/2012 12:27 PM   STOP taking these medications         CENTRUM SILVER ULTRA WOMENS PO      hyoscyamine 0.125 MG SL tablet  naproxen sodium 220 MG tablet         TAKE these medications         acetaminophen 325 MG tablet   Commonly known as: TYLENOL   Take 2 tablets (650 mg total) by mouth every 6 (six) hours as needed (or Fever >/= 101).      bisacodyl 10 MG suppository   Commonly known as: DULCOLAX   Place 1 suppository (10 mg total) rectally daily as needed.      diazepam 5 MG tablet   Commonly known as: VALIUM   take 1 tablet by mouth twice a day      DSS 100 MG Caps   Take 100 mg by mouth 2 (two) times daily.      iron polysaccharides 150 MG capsule   Commonly known as: NIFEREX   Take 1 capsule (150 mg total) by mouth 2 (two) times daily.      LEVSIN 0.125 MG tablet   Generic drug: hyoscyamine   Take 0.125 mg by mouth every 4 (four) hours as needed. Cramping      methocarbamol 500 MG tablet   Commonly known as: ROBAXIN   Take 1 tablet (500 mg total) by mouth every 6 (six) hours as needed.      NEXIUM 40 MG capsule   Generic drug: esomeprazole   TAKE 1 CAPSULE BY MOUTH ONCE DAILY      oxyCODONE 5 MG immediate release tablet   Commonly known as: Oxy IR/ROXICODONE   Take 1-2 tablets (5-10 mg total) by mouth every 4  (four) hours as needed for pain.      primidone 50 MG tablet   Commonly known as: MYSOLINE   Take 50 mg by mouth 2 (two) times daily.      rivaroxaban 10 MG Tabs tablet   Commonly known as: XARELTO   Take 1 tablet (10 mg total) by mouth daily with breakfast.           Follow-up Information    Follow up with Loanne Drilling, MD. Schedule an appointment as soon as possible for a visit in 2 weeks.   Contact information:   [facility] [address] [place] [address] [phone]          Signed: Patrica Duel 03/15/2012, 12:27 PM

## 2012-03-15 NOTE — Progress Notes (Signed)
   Subjective: 2 Days Post-Op Procedure(s) (LRB): TOTAL KNEE ARTHROPLASTY (Left) Patient reports pain as mild.   Patient seen in rounds for Dr. Lequita Halt. Patient is well, and has had no acute complaints or problems Plan is to go Skilled nursing facility after hospital stay.  Objective: Vital signs in last 24 hours: Temp:  [98 F (36.7 C)-99.4 F (37.4 C)] 99.4 F (37.4 C) (08/30 0451) Pulse Rate:  [68-110] 110  (08/30 0451) Resp:  [14-16] 16  (08/30 1115) BP: (100-135)/(60-74) 135/74 mmHg (08/30 0451) SpO2:  [90 %-99 %] 90 % (08/30 0451)  Intake/Output from previous day:  Intake/Output Summary (Last 24 hours) at 03/15/12 1125 Last data filed at 03/15/12 0900  Gross per 24 hour  Intake 2996.83 ml  Output   2185 ml  Net 811.83 ml    Intake/Output this shift: Total I/O In: 240 [P.O.:240] Out: 300 [Urine:300]  Labs:  [facility] 03/15/12 0421 03/14/12 0424  HGB 9.5* 11.9*    Basename 03/15/12 0421 03/14/12 0424  WBC 6.3 8.4  RBC 2.96* 3.77*  HCT 27.5* 34.8*  PLT 130* 191    Basename 03/15/12 0421 03/14/12 0424  NA 134* 134*  K 4.4 4.6  CL 101 102  CO2 27 26  BUN 15 12  CREATININE 0.79 0.78  GLUCOSE 123* 163*  CALCIUM 8.2* 8.6   No results found for this basename: LABPT:2,INR:2 in the last 72 hours  EXAM General - Patient is Alert, Appropriate and Oriented Extremity - Neurovascular intact Sensation intact distally Dorsiflexion/Plantar flexion intact No cellulitis present Dressing/Incision - clean, dry, no drainage, healing Motor Function - intact, moving foot and toes well on exam.   Past Medical History  Diagnosis Date  . Hx of colonic polyps   . Fracture of humerus, proximal, left, closed October 28, 2011    pt has finished physical therapy-limited ROM reaching to her back and unable to lift heavy things with left hand/arm  . Essential tremor     of head, sometimes hands.  pt takes primidone to help the tremors-DR. MILLER-NEUROLOGIST IN [place].  PT  WAS STARTED ON VALUIM MANY YEARS AGO FOR HER TREMORS--AND CONTINUES TO TAKE.  . Osteoarthritis     PAIN AND OA BILATERAL KNEES; ALSO HAS ARTHRITIS IN HANDS AND BACK BUT NO BACK PAIN  . GERD (gastroesophageal reflux disease)   . H/O hiatal hernia     Assessment/Plan: 2 Days Post-Op Procedure(s) (LRB): TOTAL KNEE ARTHROPLASTY (Left) Principal Problem:  *OA (osteoarthritis) of knee Active Problems:  Postop Hyponatremia  Postop Acute blood loss anemia   Advance diet Up with therapy Plan for discharge tomorrow Discharge to SNF  DVT Prophylaxis - Xarelto Weight-Bearing as tolerated to left leg  [name] 03/15/2012, 11:25 AM

## 2012-03-15 NOTE — Progress Notes (Signed)
CSW assisting with d/c planning . D/C Summary has been faxed to [facility] and confirmed that it was received. Week end CSW will assist with d/c planning to SNF on Sat.  Cori Razor LCSW [phone]

## 2012-03-15 NOTE — Evaluation (Addendum)
Occupational Therapy Evaluation Patient Details Name: [name] MRN: [id number] DOB: [birth year] Today's Date: 03/15/2012 Time: 0865-7846 OT Time Calculation (min): 25 min  OT Assessment / Plan / Recommendation Clinical Impression  Pt is s/p L TKA and displays decreased strength and functional mobiity and ADL. Will benefit from skilled OT services to improve ADL independence for next venue of care.     OT Assessment  Patient needs continued OT Services    Follow Up Recommendations  Skilled nursing facility    Barriers to Discharge      Equipment Recommendations  3 in 1 bedside comode    Recommendations for Other Services    Frequency  Min 2X/week    Precautions / Restrictions Precautions Precautions: Knee Required Braces or Orthoses: Knee Immobilizer - Left Knee Immobilizer - Left: Discontinue once straight leg raise with < 10 degree lag Restrictions Weight Bearing Restrictions: No LLE Weight Bearing: Weight bearing as tolerated        ADL  Eating/Feeding: Simulated;Independent Where Assessed - Eating/Feeding: Chair Grooming: Simulated;Set up Where Assessed - Grooming: Supported sitting Upper Body Bathing: Simulated;Chest;Right arm;Left arm;Abdomen;Supervision/safety;Set up Where Assessed - Upper Body Bathing: Unsupported sitting Lower Body Bathing: Simulated;Moderate assistance Where Assessed - Lower Body Bathing: Supported sit to stand Upper Body Dressing: Simulated;Set up;Supervision/safety Where Assessed - Upper Body Dressing: Unsupported sitting Lower Body Dressing: Simulated;Moderate assistance Where Assessed - Lower Body Dressing: Supported sit to stand Toilet Transfer: Mining engineer Method: Stand pivot Toileting - Clothing Manipulation and Hygiene: Simulated;Minimal assistance Where Assessed - Engineer, mining and Hygiene: Standing Tub/Shower Transfer Method: Not assessed Equipment Used: Rolling  walker ADL Comments: Pt states she feels warm when OT entered room. Informed nursing. pt requesting to return to bed so assisted pt back to bed. Pt tried to sit down on bed before fully backed up to bed so needed cues and min assist for safety.     OT Diagnosis: Generalized weakness  OT Problem List: Decreased strength;Decreased activity tolerance;Decreased knowledge of use of DME or AE OT Treatment Interventions: Self-care/ADL training;Therapeutic activities;DME and/or AE instruction;Patient/family education   OT Goals Acute Rehab OT Goals OT Goal Formulation: With patient Time For Goal Achievement: 03/22/12 Potential to Achieve Goals: Good ADL Goals Pt Will Perform Grooming: with supervision;Standing at sink ADL Goal: Grooming - Progress: Goal set today Pt Will Perform Lower Body Bathing: with supervision;Sit to stand from chair;Sit to stand from bed ADL Goal: Lower Body Bathing - Progress: Goal set today Pt Will Perform Lower Body Dressing: with supervision;Sit to stand from chair;Sit to stand from bed ADL Goal: Lower Body Dressing - Progress: Goal set today Pt Will Transfer to Toilet: with supervision;Ambulation;with DME;3-in-1 ADL Goal: Toilet Transfer - Progress: Goal set today Pt Will Perform Toileting - Clothing Manipulation: with supervision;Standing ADL Goal: Toileting - Clothing Manipulation - Progress: Goal set today  Visit Information  Last OT Received On: 03/15/12 Assistance Needed: +1    Subjective Data  Subjective: I would like to lie back down Patient Stated Goal: to go to rehab in order to return home   Prior Functioning  Vision/Perception  Home Living Lives With: Alone Available Help at Discharge: Skilled Nursing Facility Home Adaptive Equipment: Walker - rolling Prior Function Level of Independence: Independent with assistive device(s) Communication Communication: No difficulties      Cognition  Overall Cognitive Status: Appears within functional  limits for tasks assessed/performed Arousal/Alertness: Awake/alert Orientation Level: Appears intact for tasks assessed Behavior During Session: [facility] for tasks performed  Extremity/Trunk Assessment Right Upper Extremity Assessment RUE ROM/Strength/Tone: [facility] for tasks assessed Left Upper Extremity Assessment LUE ROM/Strength/Tone: Deficits LUE ROM/Strength/Tone Deficits: pt with recent humerus fracture. Only about 120 degrees shoulder flexion. pt states she isnt able to weight bear alot through L UE since fracture. Also reports difficulty internally rotating L UE. Note tremors in UEs too.   Mobility  Shoulder Instructions  Bed Mobility Bed Mobility: Sit to Supine Sit to Supine: 4: Min assist;HOB flat Details for Bed Mobility Assistance: up in recliner on arrival Transfers Transfers: Sit to Stand;Stand to Sit Sit to Stand: 4: Min assist;With upper extremity assist;From chair/3-in-1 Stand to Sit: 4: Min assist;With upper extremity assist;To bed Details for Transfer Assistance: verbal cues for safety as she tried to sit prematurely before she was fullly backed up to bed.        Exercise     Balance     End of Session OT - End of Session Equipment Utilized During Treatment: Gait belt Activity Tolerance: Other (comment) (pt not feeling well. reports feeling feverish. nsg informed) Patient left: in bed;with call bell/phone within reach;with family/visitor present CPM Left Knee CPM Left Knee: Off  GO     Lennox Laity [phone] 03/15/2012, 11:03 AM

## 2012-03-15 NOTE — Progress Notes (Signed)
Physical Therapy Treatment Note   03/15/12 1500  PT Visit Information  Last PT Received On 03/15/12  Assistance Needed +1  PT Time Calculation  PT Start Time 1419  PT Stop Time 1441  PT Time Calculation (min) 22 min  Subjective Data  Subjective pt did not want to ambulate again but agreeable to exercises  Precautions  Precautions Knee  Required Braces or Orthoses Knee Immobilizer - Left  Knee Immobilizer - Left Discontinue once straight leg raise with < 10 degree lag  Restrictions  LLE Weight Bearing WBAT  Cognition  Overall Cognitive Status Appears within functional limits for tasks assessed/performed  Bed Mobility  Bed Mobility Sit to Supine  Sit to Supine 4: Min assist;HOB flat  Details for Bed Mobility Assistance assist for L LE onto bed  Transfers  Transfers Stand to Sit;Sit to Stand;Stand Pivot Transfers  Sit to Stand 4: Min assist;With upper extremity assist;From chair/3-in-1  Stand to Sit 4: Min assist;With upper extremity assist;To bed  Stand Pivot Transfers 4: Min guard  Details for Transfer Assistance verbal cues for safe technique, assist to steady, pt did not reach back upon sitting  Exercises  Exercises Total Joint  Total Joint Exercises  Ankle Circles/Pumps AROM;Both;20 reps  Quad Sets AROM;Both;20 reps  Short Arc Jenison;Left;20 reps  Heel Slides AAROM;Left;20 reps  Hip ABduction/ADduction AROM;Strengthening;Left;20 reps  Goniometric ROM observed approx -5-60* with heel sides AAROM L knee, pt limited by pain  PT - End of Session  Equipment Utilized During Treatment Left knee immobilizer  Activity Tolerance Patient tolerated treatment well  Patient left in bed;with call bell/phone within reach;with family/visitor present  PT - Assessment/Plan  Comments on Treatment Session Pt assisted off BSC back to bed then performed exercises.  Pt plans to d/c to SNF tomorrow.  PT Plan Discharge plan remains appropriate;Frequency remains appropriate  Follow Up  Recommendations Skilled nursing facility  Equipment Recommended 3 in 1 bedside comode  Acute Rehab PT Goals  PT Goal: Sit to Supine/Side - Progress Progressing toward goal  PT Goal: Sit to Stand - Progress Progressing toward goal  PT Goal: Stand to Sit - Progress Progressing toward goal  PT Goal: Perform Home Exercise Program - Progress Progressing toward goal  PT General Charges  $$ ACUTE PT VISIT 1 Procedure  PT Treatments  $Therapeutic Exercise 8-22 mins    Zenovia Jarred, PT Pager: [phone]

## 2012-03-15 NOTE — Progress Notes (Signed)
Physical Therapy Treatment Patient Details Name: [name] MRN: [id number] DOB: 05-28-1934 Today's Date: 03/15/2012 Time: 4098-1191 PT Time Calculation (min): 19 min  PT Assessment / Plan / Recommendation Comments on Treatment Session  Pt ambulated in hallway however limited distance due to dizziness.  Pt reports desire to sit on commode to void once back in room.  Pt reports dizziness only present when standing or ambulatin but feels better once sitting.    Follow Up Recommendations  Skilled nursing facility    Barriers to Discharge        Equipment Recommendations  3 in 1 bedside comode    Recommendations for Other Services    Frequency     Plan Discharge plan remains appropriate;Frequency remains appropriate    Precautions / Restrictions Precautions Precautions: Knee Required Braces or Orthoses: Knee Immobilizer - Left Knee Immobilizer - Left: Discontinue once straight leg raise with < 10 degree lag Restrictions Weight Bearing Restrictions: No LLE Weight Bearing: Weight bearing as tolerated   Pertinent Vitals/Pain 3/10 L knee pain, ice applied, repositioned    Mobility  Bed Mobility Details for Bed Mobility Assistance: up in recliner on arrival Transfers Transfers: Stand to Sit;Sit to Stand;Stand Pivot Transfers Sit to Stand: 4: Min assist;With upper extremity assist;From chair/3-in-1 Stand to Sit: With upper extremity assist;4: Min assist;To chair/3-in-1 Stand Pivot Transfers: 4: Min guard Details for Transfer Assistance: verbal cues for safe technique, assist to steady with rise and control descent Ambulation/Gait Ambulation/Gait Assistance: 4: Min guard Ambulation Distance (Feet): 25 Feet Assistive device: Rolling walker Ambulation/Gait Assistance Details: pt continues to be limited by dizziness with ambulation however reports it is mild Gait Pattern: Step-to pattern;Decreased stance time - left;Antalgic Gait velocity: decreased General Gait Details: pt  reports dizziness however reports does not feel like she'll faint but wished to limit distance    Exercises     PT Diagnosis:    PT Problem List:   PT Treatment Interventions:     PT Goals Acute Rehab PT Goals PT Goal: Sit to Stand - Progress: Progressing toward goal PT Goal: Stand to Sit - Progress: Progressing toward goal PT Goal: Ambulate - Progress: Progressing toward goal  Visit Information  Last PT Received On: 03/15/12 Assistance Needed: +1    Subjective Data  Subjective: I need to use the bathroom.    Cognition  Overall Cognitive Status: Appears within functional limits for tasks assessed/performed    Balance     End of Session PT - End of Session Equipment Utilized During Treatment: Left knee immobilizer Activity Tolerance: Other (comment) (limited by dizziness with mobility) Patient left: in chair;with call bell/phone within reach CPM Left Knee CPM Left Knee: Off   GP     [name],[name] 03/15/2012, 10:38 AM Pager: [phone]

## 2012-03-16 LAB — CBC
MCHC: 34 g/dL (ref 30.0–36.0)
MCV: 92.4 fL (ref 78.0–100.0)
Platelets: ADEQUATE 10*3/uL (ref 150–400)
RDW: 12.9 % (ref 11.5–15.5)
WBC: 4.8 10*3/uL (ref 4.0–10.5)

## 2012-03-16 NOTE — Progress Notes (Signed)
Called report to Hyacinth Meeker, RN at [facility] where she will be transferred to via [facility]AR. Assessment unchanged from am other than Small BM.

## 2012-03-16 NOTE — Progress Notes (Signed)
Per RN, Pt ready for d/c.  Notified facility.  Facility has Pt's information and is ready to receive Pt.  Notified Pt and family.  Arranged for transportation.  Pt to be d/c'd.  Providence Crosby, LCSWA Clinical Social Work [phone]

## 2012-03-16 NOTE — Progress Notes (Signed)
   Subjective: 3 Days Post-Op Procedure(s) (LRB): TOTAL KNEE ARTHROPLASTY (Left)   Patient reports pain as mild, pain well controlled. No events throughout the night. Ready to be discharged to SNF.  Objective:   VITALS:   Filed Vitals:   03/16/12 0627  BP: 134/73  Pulse: 92  Temp: 100.3 F (37.9 C)  Resp: 16    Neurovascular intact Dorsiflexion/Plantar flexion intact Incision: dressing C/D/I No cellulitis present Compartment soft  LABS  Basename 03/16/12 0605 03/15/12 0421 03/14/12 0424  HGB 9.1* 9.5* 11.9*  HCT 26.8* 27.5* 34.8*  WBC 4.8 6.3 8.4  PLT  130* 191     Basename 03/15/12 0421 03/14/12 0424  NA 134* 134*  K 4.4 4.6  BUN 15 12  CREATININE 0.79 0.78  GLUCOSE 123* 163*     Assessment/Plan: 3 Days Post-Op Procedure(s) (LRB): TOTAL KNEE ARTHROPLASTY (Left)  Dressing changed with 4x4 guaze and tape Up with therapy Discharge to SNF Follow up in 2 weeks at [facility].  Follow-up Information    Follow up with Dr. Lequita Halt  in 2 weeks.   Contact information:   [facility] [address], [address] [place] [address] [phone]             Anastasio Auerbach. Aleida Crandell   PAC  03/16/2012, 8:59 AM

## 2012-03-18 NOTE — Progress Notes (Signed)
Clinical Social Work Department CLINICAL SOCIAL WORK PLACEMENT NOTE 03/18/2012  Patient:  [name], [name]  Account Number:  [id number] Admit date:  03/13/2012  Clinical Social Worker:  Cori Razor, LCSW  Date/time:  03/14/2012 12:01 PM  Clinical Social Work is seeking post-discharge placement for this patient at the following level of care:   SKILLED NURSING   (*CSW will update this form in Epic as items are completed)     Patient/family provided with [facility] Department of Clinical Social Work's list of facilities offering this level of care within the geographic area requested by the patient (or if unable, by the patient's family).    Patient/family informed of their freedom to choose among providers that offer the needed level of care, that participate in Medicare, Medicaid or managed care program needed by the patient, have an available bed and are willing to accept the patient.    Patient/family informed of [facility]' ownership interest in [facility], as well as of the fact that they are under no obligation to receive care at this facility.  PASARR submitted to EDS on  PASARR number received from EDS on   FL2 transmitted to all facilities in geographic area requested by pt/family on  03/14/2012 FL2 transmitted to all facilities within larger geographic area on   Patient informed that his/her managed care company has contracts with or will negotiate with  certain facilities, including the following:     Patient/family informed of bed offers received:  03/14/2012 Patient chooses bed at [facility] Physician recommends and patient chooses bed at    Patient to be transferred to [facility] [address] on  03/16/2012 Patient to be transferred to facility by EMS  The following physician request were entered in Epic:   Additional Comments:  Cori Razor LCSW [phone]

## 2012-06-25 DIAGNOSIS — R159 Full incontinence of feces: Secondary | ICD-10-CM | POA: Insufficient documentation

## 2012-06-25 DIAGNOSIS — K219 Gastro-esophageal reflux disease without esophagitis: Secondary | ICD-10-CM | POA: Insufficient documentation

## 2012-10-31 DIAGNOSIS — M8580 Other specified disorders of bone density and structure, unspecified site: Secondary | ICD-10-CM | POA: Insufficient documentation

## 2013-07-14 ENCOUNTER — Other Ambulatory Visit: Payer: Self-pay | Admitting: Orthopedic Surgery

## 2013-07-24 ENCOUNTER — Encounter (HOSPITAL_COMMUNITY): Payer: Self-pay | Admitting: Pharmacy Technician

## 2013-07-30 ENCOUNTER — Encounter (HOSPITAL_COMMUNITY)
Admission: RE | Admit: 2013-07-30 | Discharge: 2013-07-30 | Disposition: A | Discharge status: Home / Self Care | Payer: Medicare Other | Source: Ambulatory Visit | Attending: Orthopedic Surgery | Admitting: Orthopedic Surgery

## 2013-07-30 ENCOUNTER — Encounter (HOSPITAL_COMMUNITY): Payer: Self-pay

## 2013-07-30 ENCOUNTER — Encounter (INDEPENDENT_AMBULATORY_CARE_PROVIDER_SITE_OTHER): Payer: Self-pay

## 2013-07-30 DIAGNOSIS — Z01812 Encounter for preprocedural laboratory examination: Secondary | ICD-10-CM | POA: Insufficient documentation

## 2013-07-30 DIAGNOSIS — Z01818 Encounter for other preprocedural examination: Secondary | ICD-10-CM | POA: Insufficient documentation

## 2013-07-30 LAB — COMPREHENSIVE METABOLIC PANEL
ALK PHOS: 96 U/L (ref 39–117)
ALT: 63 U/L — AB (ref 0–35)
AST: 51 U/L — AB (ref 0–37)
Albumin: 4.2 g/dL (ref 3.5–5.2)
BUN: 19 mg/dL (ref 6–23)
CALCIUM: 9.3 mg/dL (ref 8.4–10.5)
CO2: 25 meq/L (ref 19–32)
Chloride: 101 mEq/L (ref 96–112)
Creatinine, Ser: 0.84 mg/dL (ref 0.50–1.10)
GFR calc Af Amer: 76 mL/min — ABNORMAL LOW (ref >90)
GFR calc non Af Amer: 65 mL/min — ABNORMAL LOW (ref >90)
Glucose, Bld: 102 mg/dL — ABNORMAL HIGH (ref 70–99)
POTASSIUM: 4.5 meq/L (ref 3.7–5.3)
SODIUM: 139 meq/L (ref 137–147)
Total Bilirubin: 0.7 mg/dL (ref 0.3–1.2)
Total Protein: 7.7 g/dL (ref 6.0–8.3)

## 2013-07-30 LAB — URINALYSIS, ROUTINE W REFLEX MICROSCOPIC
Glucose, UA: NEGATIVE mg/dL
Hgb urine dipstick: NEGATIVE
Ketones, ur: NEGATIVE mg/dL
NITRITE: NEGATIVE
PROTEIN: NEGATIVE mg/dL
SPECIFIC GRAVITY, URINE: 1.028 (ref 1.005–1.030)
UROBILINOGEN UA: 0.2 mg/dL (ref 0.0–1.0)
pH: 5.5 (ref 5.0–8.0)

## 2013-07-30 LAB — CBC
HCT: 41.4 % (ref 36.0–46.0)
HEMOGLOBIN: 14.3 g/dL (ref 12.0–15.0)
MCH: 33.2 pg (ref 26.0–34.0)
MCHC: 34.5 g/dL (ref 30.0–36.0)
MCV: 96.1 fL (ref 78.0–100.0)
PLATELETS: 164 10*3/uL (ref 150–400)
RBC: 4.31 MIL/uL (ref 3.87–5.11)
RDW: 12.5 % (ref 11.5–15.5)
WBC: 5 10*3/uL (ref 4.0–10.5)

## 2013-07-30 LAB — URINE MICROSCOPIC-ADD ON

## 2013-07-30 LAB — SURGICAL PCR SCREEN
MRSA, PCR: NEGATIVE
STAPHYLOCOCCUS AUREUS: NEGATIVE

## 2013-07-30 LAB — PROTIME-INR
INR: 1.01 (ref 0.00–1.49)
Prothrombin Time: 13.1 seconds (ref 11.6–15.2)

## 2013-07-30 LAB — APTT: APTT: 28 s (ref 24–37)

## 2013-07-30 NOTE — Progress Notes (Signed)
07-30-13 1525 Labs viewable in Epic-note urinalysis.

## 2013-07-30 NOTE — Patient Instructions (Addendum)
20 RAJAH LAMBA  07/30/2013   Your procedure is scheduled on:1-19   -2015  Report to [place] at      0930  AM .  Call this number if you have problems the morning of surgery: [phone]  Or Presurgical Testing [phone](Ayvah Caroll) For Living Will and/or Health Care Power Attorney Forms: please provide copy for your medical record,may bring AM of surgery(Forms should be already notarized -we do not provide this service).     Do not eat food:After Midnight.  May have clear liquids:up to 6 Hours before arrival. Nothing after : 0600 AM  Clear liquids include soda, tea, black coffee, apple or grape juice, broth.  Take these medicines the morning of surgery with A SIP OF WATER: Valium.Primidone. Nexium.   Do not wear jewelry, make-up or nail polish.  Do not wear lotions, powders, or perfumes. You may wear deodorant.  Do not shave 12 hours prior to first CHG shower(legs and under arms).(face and neck okay.)  Do not bring valuables to the hospital.(Hospital is not responsible for lost valuables).  Contacts, dentures or removable bridgework, body piercing, hair pins may not be worn into surgery.  Leave suitcase in the car. After surgery it may be brought to your room.  For patients admitted to the hospital, checkout time is 11:00 AM the day of discharge.(Restricted visitors-Persons, age 35 or younger - may not visit at this time.)    Patients discharged the day of surgery will not be allowed to drive home. Must have responsible person with you x 24 hours once discharged.  Name and phone number of your driver: Candie Mile. Daughter [phone] cell  Special Instructions: CHG(Chlorhedine 4%-"Hibiclens","Betasept","Aplicare") Shower Use Special Wash: see special instructions.(avoid face and genitals)   Please read over the following fact sheets that you were given: MRSA Information, Blood Transfusion fact sheet, Incentive Spirometry Instruction.  Remember : Type/Screen  "Blue armbands" - may not be removed once applied(would result in being retested AM of surgery, if removed).  Failure to follow these instructions may result in Cancellation of your surgery.   Patient signature_______________________________________________________

## 2013-07-30 NOTE — Pre-Procedure Instructions (Addendum)
07-30-13 EKG/ECHO/Stress 7'14--report with chart. 07-30-13 1525 Dr. Wynelle Link notified of urinalysis via fax to module [phone].W. Otila Starn,RN

## 2013-08-03 ENCOUNTER — Other Ambulatory Visit: Payer: Self-pay | Admitting: Orthopedic Surgery

## 2013-08-03 NOTE — H&P (Signed)
[name]. [name]  DOB: [birth year] Single / Language: [name] / Race: White Female  Date of Admission:  08-04-2013  Chief Complaint:  Right Knee Pain  History of Present Illness The patient is a 80 year old female who comes in for a preoperative History and Physical. The patient is scheduled for a right total knee arthroplasty to be performed by Dr. Dione Plover. Aluisio, MD at [facility] on 08/04/2013. The patient is a 80 year old female who presents for follow up of their knee. The patient is being followed for their bilateral knee pain and osteoarthritis (of the right). Symptoms reported today include: pain and swelling. [name] comes in for a recheck of her right knee pain. She has osoteoarthritis of the right knee.  She is doing very well in regards to her left total knee other than the fact that she has started to have some achiness on the left knee recently because she is compensating for the right knee pain. She has been having more trouble with the right knee giving out on her. She has had two falls because of her knee. She did not land directly on her knees. She has noticed a "knot" on her left knee since the fall. It is not tender or painful but she is concerned that something could have come apart in her left total knee. She denies groin pain as well as numbness and tingling in the legs. She feels that the right knee has gotten to a point that she needs to have the right knee replaced because not only is it painful and limiting her activities, but it is giving out on her. She is now ready to proceed with the right knee surgery. They have been treated conservatively in the past for the above stated problem and despite conservative measures, they continue to have progressive pain and severe functional limitations and dysfunction. They have failed non-operative management including home exercise, medications, and injections. It is felt that they would benefit from undergoing total  joint replacement. Risks and benefits of the procedure have been discussed with the patient and they elect to proceed with surgery. There are no active contraindications to surgery such as ongoing infection or rapidly progressive neurological disease.   Allergies Sulfa Drugs. Rash. Codeine/Codeine Derivatives. Nausea.   Problem List/Past MedicalHistory Fracture, Proximal Humerus, closed (812.09) S/P arthroscopy of knee (V45.89) Sprain/Strain, Wrist (842.00) S/P Left total knee arthroplasty (V43.65) Primary osteoarthritis of one knee (715.16) Essential tremor (333.1) Tear, medial meniscus, knee, current (836.0). 12/20/2010 Gastroesophageal Reflux Disease Osteoarthritis Hiatal Hernia Hemorrhoids Obesity Cataract. Left Eye   Family History Cancer. grandfather mothers side Cerebrovascular Accident. mother Kidney disease. father    Social History Alcohol use. current drinker; drinks wine; 8-14 per week Exercise. Exercises rarely Illicit drug use. no Drug/Alcohol Rehab (Previously). no Tobacco use. Never smoker. never smoker Living situation. live alone Marital status. widowed Tobacco / smoke exposure. no Number of flights of stairs before winded. 1 Children. 4 Drug/Alcohol Rehab (Currently). 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 Plan is to go to Rehab after the [place]. Advance Directives. Living Will, Healthcare POA    Medication History Aleve Active. Glucosamine chondroitin Active. Valium (5MG  Tablet, Oral) Active. Primidone (50MG  Tablet, 1/2 tablet Oral three times daily) Active. Levsin (0.125MG  Tablet, Oral) Active. NexIUM (40MG  Capsule DR, Oral daily) Active. Centrum Silver ( Oral) Active. Calcium 600 (1500MG  Tablet, Oral) Active.   Past Surgical History Foot Surgery. right Arthroscopy of Knee.  bilateral Total Knee Replacement - Left. Date: 03/13/2012.   Review of  Systems General:Not Present- Chills, Fever, Night Sweats, Fatigue, Weight Gain, Weight Loss and Memory Loss. Skin:Not Present- Hives, Itching, Rash, Eczema and Lesions. HEENT:Not Present- Tinnitus, Headache, Double Vision, Visual Loss, Hearing Loss and Dentures. Respiratory:Not Present- Shortness of breath with exertion, Shortness of breath at rest, Allergies, Coughing up blood and Chronic Cough. Cardiovascular:Not Present- Chest Pain, Racing/skipping heartbeats, Difficulty Breathing Lying Down, Murmur, Swelling and Palpitations. Gastrointestinal:Present- Heartburn, Constipation and Diarrhea. Not Present- Bloody Stool, Abdominal Pain, Vomiting, Nausea, Difficulty Swallowing, Jaundice and Loss of appetitie. Female Genitourinary:Not Present- Blood in Urine, Urinary frequency, Weak urinary stream, Discharge, Flank Pain, Incontinence, Painful Urination, Urgency, Urinary Retention and Urinating at Night. Musculoskeletal:Present- Joint Pain. Not Present- Muscle Weakness, Muscle Pain, Joint Swelling, Back Pain, Morning Stiffness and Spasms. Neurological:Present- Tremor (History of essential tremors). Not Present- Dizziness, Blackout spells, Paralysis, Difficulty with balance and Weakness. Psychiatric:Not Present- Insomnia.    Vitals Pulse: 64 (Regular) Resp.: 14 (Unlabored) BP: 158/72 (Sitting, Right Arm, Standard)  Physical Exam The physical exam findings are as follows:   General Mental Status - Alert, cooperative and good historian. General Appearance- pleasant. Not in acute distress. Orientation- Oriented X3. Build & Nutrition- Well nourished and Well developed.   Head and Neck Head- normocephalic, atraumatic . Neck Global Assessment- supple. no bruit auscultated on the right and no bruit auscultated on the left.   Eye Vision- Wears corrective lenses. Pupil- Bilateral- Regular and Round. Motion- Bilateral- EOMI.   Chest and Lung  Exam Auscultation: Breath sounds:- clear at anterior chest wall and - clear at posterior chest wall. Adventitious sounds:- No Adventitious sounds.   Cardiovascular Auscultation:Rhythm- Regular rate and rhythm. Heart Sounds- S1 WNL and S2 WNL. Murmurs & Other Heart Sounds:Auscultation of the heart reveals - No Murmurs.   Abdomen Palpation/Percussion:Tenderness- Abdomen is non-tender to palpation. Rigidity (guarding)- Abdomen is soft. Auscultation:Auscultation of the abdomen reveals - Bowel sounds normal.   Female Genitourinary Not done, not pertinent to present illness  Musculoskeletal  Alert and oriented and in no acute distress. Left knee nontender to palpation. 0 to 125 degrees. No effusion or instability. BB sized nodule palpated over the patella. Patella is tracking well. Incision healed with no signs of infection. No erythema. Right knee has a significant varus deformity. Range 5 to 110. Marked crepitus on range of motion. Tender mediall greater than laterally. No effusion noted. No instability noted. Sensation and circulation intact in LE. No motor deficits in LE. Calves soft and nontender.  Radiographs: AP and lateral views of the right knee shows bone on bone arthritis in the medial compartment of the right knee with a significant varus deformity. The left total knee appears to be in excellent position with no periprosthetic abnormalities.   Assessment & Plan S/P Left total knee arthroplasty (V43.65)  Primary osteoarthritis of one knee (715.16) Impression: Right Knee  Note: Plan is for a Right Total Knee Replacement by Dr. Wynelle Link.  Plan is to go [facility] following the hospital stay.  PCP - Dr. Coletta Memos - Patient has been seen preoperatively and felt to be stable for surgery.  The patient does not have any contraindications and will receive TXA (tranexamic acid) prior to surgery.  Signed electronically by Joelene Millin, III PA-C

## 2013-08-04 ENCOUNTER — Inpatient Hospital Stay (HOSPITAL_COMMUNITY)
Admission: RE | Admit: 2013-08-04 | Discharge: 2013-08-07 | DRG: 470 | Disposition: A | Discharge status: Skilled Nursing Facility | Payer: Medicare Other | Source: Ambulatory Visit | Attending: Orthopedic Surgery | Admitting: Orthopedic Surgery

## 2013-08-04 ENCOUNTER — Encounter (HOSPITAL_COMMUNITY): Payer: Self-pay | Admitting: *Deleted

## 2013-08-04 ENCOUNTER — Encounter (HOSPITAL_COMMUNITY): Payer: Medicare Other | Admitting: Anesthesiology

## 2013-08-04 ENCOUNTER — Encounter (HOSPITAL_COMMUNITY)
Admission: RE | Disposition: A | Discharge status: Skilled Nursing Facility | Payer: Self-pay | Source: Ambulatory Visit | Attending: Orthopedic Surgery

## 2013-08-04 ENCOUNTER — Inpatient Hospital Stay (HOSPITAL_COMMUNITY): Payer: Medicare Other | Admitting: Anesthesiology

## 2013-08-04 DIAGNOSIS — Z860100 Personal history of colon polyps, unspecified: Secondary | ICD-10-CM

## 2013-08-04 DIAGNOSIS — Z96659 Presence of unspecified artificial knee joint: Secondary | ICD-10-CM

## 2013-08-04 DIAGNOSIS — M1710 Unilateral primary osteoarthritis, unspecified knee: Principal | ICD-10-CM | POA: Diagnosis present

## 2013-08-04 DIAGNOSIS — M179 Osteoarthritis of knee, unspecified: Secondary | ICD-10-CM

## 2013-08-04 DIAGNOSIS — Z823 Family history of stroke: Secondary | ICD-10-CM

## 2013-08-04 DIAGNOSIS — G250 Essential tremor: Secondary | ICD-10-CM | POA: Diagnosis present

## 2013-08-04 DIAGNOSIS — K219 Gastro-esophageal reflux disease without esophagitis: Secondary | ICD-10-CM | POA: Diagnosis present

## 2013-08-04 DIAGNOSIS — D62 Acute posthemorrhagic anemia: Secondary | ICD-10-CM | POA: Diagnosis not present

## 2013-08-04 DIAGNOSIS — Z01812 Encounter for preprocedural laboratory examination: Secondary | ICD-10-CM

## 2013-08-04 DIAGNOSIS — E669 Obesity, unspecified: Secondary | ICD-10-CM | POA: Diagnosis present

## 2013-08-04 DIAGNOSIS — Z6829 Body mass index (BMI) 29.0-29.9, adult: Secondary | ICD-10-CM

## 2013-08-04 DIAGNOSIS — K449 Diaphragmatic hernia without obstruction or gangrene: Secondary | ICD-10-CM | POA: Diagnosis present

## 2013-08-04 DIAGNOSIS — Z96651 Presence of right artificial knee joint: Secondary | ICD-10-CM

## 2013-08-04 DIAGNOSIS — G252 Other specified forms of tremor: Secondary | ICD-10-CM

## 2013-08-04 HISTORY — PX: TOTAL KNEE ARTHROPLASTY: SHX125

## 2013-08-04 LAB — TYPE AND SCREEN
ABO/RH(D): A POS
ANTIBODY SCREEN: NEGATIVE

## 2013-08-04 SURGERY — ARTHROPLASTY, KNEE, TOTAL
Anesthesia: Spinal | Site: Knee | Laterality: Right

## 2013-08-04 MED ORDER — BUPIVACAINE IN DEXTROSE 0.75-8.25 % IT SOLN
INTRATHECAL | Status: DC | PRN
  Administered 2013-08-04: 1.8 mL via INTRATHECAL

## 2013-08-04 MED ORDER — PROPOFOL 10 MG/ML IV BOLUS
INTRAVENOUS | Status: AC
  Filled 2013-08-04: qty 20

## 2013-08-04 MED ORDER — HYDROMORPHONE HCL PF 1 MG/ML IJ SOLN
0.5000 mg | INTRAMUSCULAR | Status: DC | PRN
  Administered 2013-08-05 – 2013-08-06 (×2): 0.5 mg via INTRAVENOUS
  Filled 2013-08-04 (×2): qty 1

## 2013-08-04 MED ORDER — METHOCARBAMOL 100 MG/ML IJ SOLN
500.0000 mg | Freq: Four times a day (QID) | INTRAVENOUS | Status: DC | PRN
  Filled 2013-08-04: qty 5

## 2013-08-04 MED ORDER — LACTATED RINGERS IV SOLN
INTRAVENOUS | Status: DC | PRN
  Administered 2013-08-04 (×2): via INTRAVENOUS

## 2013-08-04 MED ORDER — CHLORHEXIDINE GLUCONATE 4 % EX LIQD: 60.0000 mL | Freq: Once | CUTANEOUS | Status: DC

## 2013-08-04 MED ORDER — MEPERIDINE HCL 50 MG/ML IJ SOLN: 6.2500 mg | INTRAMUSCULAR | Status: DC | PRN

## 2013-08-04 MED ORDER — PROMETHAZINE HCL 25 MG/ML IJ SOLN: 6.2500 mg | INTRAMUSCULAR | Status: DC | PRN

## 2013-08-04 MED ORDER — ONDANSETRON HCL 4 MG/2ML IJ SOLN
INTRAMUSCULAR | Status: DC | PRN
  Administered 2013-08-04: 4 mg via INTRAVENOUS

## 2013-08-04 MED ORDER — METOCLOPRAMIDE HCL 10 MG PO TABS: 5.0000 mg | ORAL_TABLET | Freq: Three times a day (TID) | ORAL | Status: DC | PRN

## 2013-08-04 MED ORDER — MENTHOL 3 MG MT LOZG: 1.0000 | LOZENGE | OROMUCOSAL | Status: DC | PRN

## 2013-08-04 MED ORDER — FLEET ENEMA 7-19 GM/118ML RE ENEM: 1.0000 | ENEMA | Freq: Once | RECTAL | Status: AC | PRN

## 2013-08-04 MED ORDER — DEXAMETHASONE SODIUM PHOSPHATE 10 MG/ML IJ SOLN
10.0000 mg | Freq: Every day | INTRAMUSCULAR | Status: AC
  Filled 2013-08-04: qty 1

## 2013-08-04 MED ORDER — SODIUM CHLORIDE 0.9 % IJ SOLN
INTRAMUSCULAR | Status: AC
  Filled 2013-08-04: qty 10

## 2013-08-04 MED ORDER — DEXAMETHASONE SODIUM PHOSPHATE 10 MG/ML IJ SOLN
10.0000 mg | Freq: Once | INTRAMUSCULAR | Status: AC
  Administered 2013-08-04: 10 mg via INTRAVENOUS

## 2013-08-04 MED ORDER — CEFAZOLIN SODIUM-DEXTROSE 2-3 GM-% IV SOLR
INTRAVENOUS | Status: AC
  Filled 2013-08-04: qty 50

## 2013-08-04 MED ORDER — HYOSCYAMINE SULFATE 0.125 MG PO TABS
0.1250 mg | ORAL_TABLET | ORAL | Status: DC | PRN
  Filled 2013-08-04: qty 1

## 2013-08-04 MED ORDER — ACETAMINOPHEN 500 MG PO TABS
1000.0000 mg | ORAL_TABLET | Freq: Once | ORAL | Status: AC
  Administered 2013-08-04: 1000 mg via ORAL
  Filled 2013-08-04: qty 2

## 2013-08-04 MED ORDER — ACETAMINOPHEN 325 MG PO TABS: 650.0000 mg | ORAL_TABLET | Freq: Four times a day (QID) | ORAL | Status: DC | PRN

## 2013-08-04 MED ORDER — RIVAROXABAN 10 MG PO TABS: 10.0000 mg | ORAL_TABLET | Freq: Every day | ORAL | Status: DC

## 2013-08-04 MED ORDER — ONDANSETRON HCL 4 MG/2ML IJ SOLN: 4.0000 mg | Freq: Four times a day (QID) | INTRAMUSCULAR | Status: DC | PRN

## 2013-08-04 MED ORDER — LACTATED RINGERS IV SOLN: INTRAVENOUS | Status: DC

## 2013-08-04 MED ORDER — SODIUM CHLORIDE 0.9 % IR SOLN
Status: DC | PRN
  Administered 2013-08-04: 1000 mL

## 2013-08-04 MED ORDER — KETOROLAC TROMETHAMINE 15 MG/ML IJ SOLN: 7.5000 mg | Freq: Four times a day (QID) | INTRAMUSCULAR | Status: AC | PRN

## 2013-08-04 MED ORDER — METOCLOPRAMIDE HCL 5 MG/ML IJ SOLN: 5.0000 mg | Freq: Three times a day (TID) | INTRAMUSCULAR | Status: DC | PRN

## 2013-08-04 MED ORDER — PRIMIDONE 50 MG PO TABS
50.0000 mg | ORAL_TABLET | Freq: Two times a day (BID) | ORAL | Status: DC
  Administered 2013-08-04 – 2013-08-07 (×6): 50 mg via ORAL
  Filled 2013-08-04 (×7): qty 1

## 2013-08-04 MED ORDER — EPHEDRINE SULFATE 50 MG/ML IJ SOLN
INTRAMUSCULAR | Status: AC
  Filled 2013-08-04: qty 1

## 2013-08-04 MED ORDER — FENTANYL CITRATE 0.05 MG/ML IJ SOLN
INTRAMUSCULAR | Status: AC
  Filled 2013-08-04: qty 2

## 2013-08-04 MED ORDER — FENTANYL CITRATE 0.05 MG/ML IJ SOLN
INTRAMUSCULAR | Status: DC | PRN
  Administered 2013-08-04: 50 ug via INTRAVENOUS

## 2013-08-04 MED ORDER — DIPHENHYDRAMINE HCL 12.5 MG/5ML PO ELIX: 12.5000 mg | ORAL_SOLUTION | ORAL | Status: DC | PRN

## 2013-08-04 MED ORDER — FENTANYL CITRATE 0.05 MG/ML IJ SOLN: 25.0000 ug | INTRAMUSCULAR | Status: DC | PRN

## 2013-08-04 MED ORDER — METHOCARBAMOL 500 MG PO TABS
500.0000 mg | ORAL_TABLET | Freq: Four times a day (QID) | ORAL | Status: DC | PRN
  Administered 2013-08-04 – 2013-08-07 (×7): 500 mg via ORAL
  Filled 2013-08-04 (×7): qty 1

## 2013-08-04 MED ORDER — ONDANSETRON HCL 4 MG/2ML IJ SOLN
INTRAMUSCULAR | Status: AC
  Filled 2013-08-04: qty 2

## 2013-08-04 MED ORDER — TRAMADOL HCL 50 MG PO TABS
50.0000 mg | ORAL_TABLET | Freq: Four times a day (QID) | ORAL | Status: DC | PRN
  Administered 2013-08-05 – 2013-08-06 (×2): 50 mg via ORAL
  Filled 2013-08-04 (×2): qty 1

## 2013-08-04 MED ORDER — DOCUSATE SODIUM 100 MG PO CAPS
100.0000 mg | ORAL_CAPSULE | Freq: Two times a day (BID) | ORAL | Status: DC
  Administered 2013-08-04 – 2013-08-07 (×6): 100 mg via ORAL

## 2013-08-04 MED ORDER — DEXAMETHASONE 6 MG PO TABS
10.0000 mg | ORAL_TABLET | Freq: Every day | ORAL | Status: AC
  Administered 2013-08-05: 10 mg via ORAL
  Filled 2013-08-04: qty 1

## 2013-08-04 MED ORDER — HYDROCODONE-ACETAMINOPHEN 5-325 MG PO TABS
1.0000 | ORAL_TABLET | ORAL | Status: DC | PRN
  Administered 2013-08-04 – 2013-08-07 (×14): 2 via ORAL
  Filled 2013-08-04 (×14): qty 2

## 2013-08-04 MED ORDER — PANTOPRAZOLE SODIUM 40 MG PO TBEC
80.0000 mg | DELAYED_RELEASE_TABLET | Freq: Every day | ORAL | Status: DC
  Filled 2013-08-04: qty 2

## 2013-08-04 MED ORDER — SODIUM CHLORIDE 0.9 % IV SOLN: INTRAVENOUS | Status: DC

## 2013-08-04 MED ORDER — CEFAZOLIN SODIUM-DEXTROSE 2-3 GM-% IV SOLR
2.0000 g | Freq: Four times a day (QID) | INTRAVENOUS | Status: AC
  Administered 2013-08-04 – 2013-08-05 (×2): 2 g via INTRAVENOUS
  Filled 2013-08-04 (×2): qty 50

## 2013-08-04 MED ORDER — CEFAZOLIN SODIUM-DEXTROSE 2-3 GM-% IV SOLR
2.0000 g | INTRAVENOUS | Status: AC
  Administered 2013-08-04: 2 g via INTRAVENOUS

## 2013-08-04 MED ORDER — DEXAMETHASONE SODIUM PHOSPHATE 10 MG/ML IJ SOLN
INTRAMUSCULAR | Status: AC
  Filled 2013-08-04: qty 1

## 2013-08-04 MED ORDER — EPHEDRINE SULFATE 50 MG/ML IJ SOLN
INTRAMUSCULAR | Status: DC | PRN
  Administered 2013-08-04: 5 mg via INTRAVENOUS

## 2013-08-04 MED ORDER — PHENOL 1.4 % MT LIQD: 1.0000 | OROMUCOSAL | Status: DC | PRN

## 2013-08-04 MED ORDER — BISACODYL 10 MG RE SUPP: 10.0000 mg | Freq: Every day | RECTAL | Status: DC | PRN

## 2013-08-04 MED ORDER — TRANEXAMIC ACID 100 MG/ML IV SOLN
1000.0000 mg | INTRAVENOUS | Status: AC
  Administered 2013-08-04: 1000 mg via INTRAVENOUS
  Filled 2013-08-04: qty 10

## 2013-08-04 MED ORDER — SODIUM CHLORIDE 0.9 % IJ SOLN
INTRAMUSCULAR | Status: DC | PRN
  Administered 2013-08-04: 14:00:00

## 2013-08-04 MED ORDER — ACETAMINOPHEN 650 MG RE SUPP: 650.0000 mg | Freq: Four times a day (QID) | RECTAL | Status: DC | PRN

## 2013-08-04 MED ORDER — BUPIVACAINE LIPOSOME 1.3 % IJ SUSP
20.0000 mL | Freq: Once | INTRAMUSCULAR | Status: DC
  Filled 2013-08-04: qty 20

## 2013-08-04 MED ORDER — BUPIVACAINE HCL 0.25 % IJ SOLN
INTRAMUSCULAR | Status: DC | PRN
  Administered 2013-08-04: 30 mL

## 2013-08-04 MED ORDER — 0.9 % SODIUM CHLORIDE (POUR BTL) OPTIME
TOPICAL | Status: DC | PRN
  Administered 2013-08-04: 1000 mL

## 2013-08-04 MED ORDER — SODIUM CHLORIDE 0.9 % IV SOLN
INTRAVENOUS | Status: DC
  Administered 2013-08-04: 17:00:00 via INTRAVENOUS

## 2013-08-04 MED ORDER — BUPIVACAINE HCL (PF) 0.25 % IJ SOLN
INTRAMUSCULAR | Status: AC
  Filled 2013-08-04: qty 30

## 2013-08-04 MED ORDER — PROPOFOL 10 MG/ML IV BOLUS
INTRAVENOUS | Status: DC | PRN
  Administered 2013-08-04 (×4): 20 mg via INTRAVENOUS

## 2013-08-04 MED ORDER — PROPOFOL INFUSION 10 MG/ML OPTIME
INTRAVENOUS | Status: DC | PRN
  Administered 2013-08-04: 50 ug/kg/min via INTRAVENOUS

## 2013-08-04 MED ORDER — DIAZEPAM 5 MG PO TABS
5.0000 mg | ORAL_TABLET | Freq: Two times a day (BID) | ORAL | Status: DC
  Administered 2013-08-04 – 2013-08-07 (×6): 5 mg via ORAL
  Filled 2013-08-04 (×6): qty 1

## 2013-08-04 MED ORDER — SODIUM CHLORIDE 0.9 % IJ SOLN
INTRAMUSCULAR | Status: AC
  Filled 2013-08-04: qty 50

## 2013-08-04 MED ORDER — POLYETHYLENE GLYCOL 3350 17 G PO PACK
17.0000 g | PACK | Freq: Every day | ORAL | Status: DC | PRN
  Administered 2013-08-05 – 2013-08-06 (×2): 17 g via ORAL

## 2013-08-04 MED ORDER — ONDANSETRON HCL 4 MG PO TABS: 4.0000 mg | ORAL_TABLET | Freq: Four times a day (QID) | ORAL | Status: DC | PRN

## 2013-08-04 SURGICAL SUPPLY — 58 items
BAG SPEC THK2 15X12 ZIP CLS (MISCELLANEOUS) ×1
BAG ZIPLOCK 12X15 (MISCELLANEOUS) ×3 IMPLANT
BANDAGE ELASTIC 6 VELCRO ST LF (GAUZE/BANDAGES/DRESSINGS) ×3 IMPLANT
BANDAGE ESMARK 6X9 LF (GAUZE/BANDAGES/DRESSINGS) ×1 IMPLANT
BLADE SAG 18X100X1.27 (BLADE) ×3 IMPLANT
BLADE SAW SGTL 11.0X1.19X90.0M (BLADE) ×3 IMPLANT
BNDG CMPR 9X6 STRL LF SNTH (GAUZE/BANDAGES/DRESSINGS) ×1
BNDG ESMARK 6X9 LF (GAUZE/BANDAGES/DRESSINGS) ×3
BOWL SMART MIX CTS (DISPOSABLE) ×3 IMPLANT
CAPT RP KNEE ×2 IMPLANT
CEMENT HV SMART SET (Cement) ×6 IMPLANT
CLOSURE WOUND 1/2 X4 (GAUZE/BANDAGES/DRESSINGS) ×2
CUFF TOURN SGL QUICK 34 (TOURNIQUET CUFF) ×3
CUFF TRNQT CYL 34X4X40X1 (TOURNIQUET CUFF) ×1 IMPLANT
DECANTER SPIKE VIAL GLASS SM (MISCELLANEOUS) ×3 IMPLANT
DRAPE EXTREMITY T 121X128X90 (DRAPE) ×3 IMPLANT
DRAPE POUCH INSTRU U-SHP 10X18 (DRAPES) ×3 IMPLANT
DRAPE U-SHAPE 47X51 STRL (DRAPES) ×3 IMPLANT
DRSG ADAPTIC 3X8 NADH LF (GAUZE/BANDAGES/DRESSINGS) ×3 IMPLANT
DURAPREP 26ML APPLICATOR (WOUND CARE) ×3 IMPLANT
ELECT REM PT RETURN 9FT ADLT (ELECTROSURGICAL) ×3
ELECTRODE REM PT RTRN 9FT ADLT (ELECTROSURGICAL) ×1 IMPLANT
EVACUATOR 1/8 PVC DRAIN (DRAIN) ×3 IMPLANT
FACESHIELD LNG OPTICON STERILE (SAFETY) ×15 IMPLANT
GLOVE BIO SURGEON STRL SZ7.5 (GLOVE) IMPLANT
GLOVE BIO SURGEON STRL SZ8 (GLOVE) ×3 IMPLANT
GLOVE BIOGEL PI IND STRL 8 (GLOVE) ×2 IMPLANT
GLOVE BIOGEL PI INDICATOR 8 (GLOVE) ×4
GLOVE SURG SS PI 6.5 STRL IVOR (GLOVE) IMPLANT
GOWN STRL REUS W/TWL LRG LVL3 (GOWN DISPOSABLE) ×3 IMPLANT
GOWN STRL REUS W/TWL XL LVL3 (GOWN DISPOSABLE) IMPLANT
HANDPIECE INTERPULSE COAX TIP (DISPOSABLE) ×3
IMMOBILIZER KNEE 20 (SOFTGOODS) ×3 IMPLANT
KIT BASIN OR (CUSTOM PROCEDURE TRAY) ×3 IMPLANT
MANIFOLD NEPTUNE II (INSTRUMENTS) ×3 IMPLANT
NDL SAFETY ECLIPSE 18X1.5 (NEEDLE) ×2 IMPLANT
NEEDLE HYPO 18GX1.5 SHARP (NEEDLE) ×6
NS IRRIG 1000ML POUR BTL (IV SOLUTION) ×3 IMPLANT
PACK TOTAL JOINT (CUSTOM PROCEDURE TRAY) ×3 IMPLANT
PAD ABD 8X10 STRL (GAUZE/BANDAGES/DRESSINGS) ×3 IMPLANT
PADDING CAST ABS 6INX4YD NS (CAST SUPPLIES) ×2
PADDING CAST ABS COTTON 6X4 NS (CAST SUPPLIES) IMPLANT
PADDING CAST COTTON 6X4 STRL (CAST SUPPLIES) ×9 IMPLANT
POSITIONER SURGICAL ARM (MISCELLANEOUS) ×3 IMPLANT
SET HNDPC FAN SPRY TIP SCT (DISPOSABLE) ×1 IMPLANT
SPONGE GAUZE 4X4 12PLY (GAUZE/BANDAGES/DRESSINGS) ×3 IMPLANT
STRIP CLOSURE SKIN 1/2X4 (GAUZE/BANDAGES/DRESSINGS) ×4 IMPLANT
SUCTION FRAZIER 12FR DISP (SUCTIONS) ×3 IMPLANT
SUT MNCRL AB 4-0 PS2 18 (SUTURE) ×3 IMPLANT
SUT VIC AB 2-0 CT1 27 (SUTURE) ×9
SUT VIC AB 2-0 CT1 TAPERPNT 27 (SUTURE) ×3 IMPLANT
SUT VLOC 180 0 24IN GS25 (SUTURE) ×3 IMPLANT
SYR 20CC LL (SYRINGE) ×3 IMPLANT
SYR 50ML LL SCALE MARK (SYRINGE) ×3 IMPLANT
TOWEL OR 17X26 10 PK STRL BLUE (TOWEL DISPOSABLE) ×6 IMPLANT
TRAY FOLEY CATH 14FRSI W/METER (CATHETERS) ×3 IMPLANT
WATER STERILE IRR 1500ML POUR (IV SOLUTION) ×3 IMPLANT
WRAP KNEE MAXI GEL POST OP (GAUZE/BANDAGES/DRESSINGS) ×3 IMPLANT

## 2013-08-04 NOTE — Anesthesia Postprocedure Evaluation (Signed)
  Anesthesia Post-op Note  Patient: [name]  Procedure(s) Performed: Procedure(s) (LRB): RIGHT TOTAL KNEE ARTHROPLASTY (Right)  Patient Location: PACU  Anesthesia Type: Spinal  Level of Consciousness: awake and alert   Airway and Oxygen Therapy: Patient Spontanous Breathing  Post-op Pain: mild  Post-op Assessment: Post-op Vital signs reviewed, Patient's Cardiovascular Status Stable, Respiratory Function Stable, Patent Airway and No signs of Nausea or vomiting  Last Vitals:  Filed Vitals:   08/04/13 1645  BP: 115/45  Pulse: 68  Temp: 36.7 C  Resp: 14    Post-op Vital Signs: stable   Complications: No apparent anesthesia complications

## 2013-08-04 NOTE — Progress Notes (Signed)
Muscle cramps in operative leg

## 2013-08-04 NOTE — Op Note (Signed)
Pre-operative diagnosis- Osteoarthritis  Right knee(s)  Post-operative diagnosis- Osteoarthritis Right knee(s)  Procedure-  Right  Total Knee Arthroplasty  Surgeon- Dione Plover. Cova Knieriem, MD  Assistant- Arlee Muslim, PA-C   Anesthesia-  Spinal EBL-* No blood loss amount entered *  Drains Hemovac  Tourniquet time- 30 minutes @ 622 mm Hg  Complications- None  Condition-PACU - hemodynamically stable.   Brief Clinical Note  [name] is a 80 y.o. year old female with end stage OA of her right knee with progressively worsening pain and dysfunction. She has constant pain, with activity and at rest and significant functional deficits with difficulties even with ADLs. She has had extensive non-op management including analgesics, injections of cortisone and viscosupplements, and home exercise program, but remains in significant pain with significant dysfunction.Radiographs show bone on bone arthritis medial and patellofemoral. She presents now for right Total Knee Arthroplasty.    Procedure in detail---   The patient is brought into the operating room and positioned supine on the operating table. After successful administration of  Spinal,   a tourniquet is placed high on the  Right thigh(s) and the lower extremity is prepped and draped in the usual sterile fashion. Time out is performed by the operating team and then the  Right lower extremity is wrapped in Esmarch, knee flexed and the tourniquet inflated to 300 mmHg.       A midline incision is made with a ten blade through the subcutaneous tissue to the level of the extensor mechanism. A fresh blade is used to make a medial parapatellar arthrotomy. Soft tissue over the proximal medial tibia is subperiosteally elevated to the joint line with a knife and into the semimembranosus bursa with a Cobb elevator. Soft tissue over the proximal lateral tibia is elevated with attention being paid to avoiding the patellar tendon on the tibial tubercle. The  patella is everted, knee flexed 90 degrees and the ACL and PCL are removed. Findings are bone on bone medial and patellofemoral with large medial osteophytes.        The drill is used to create a starting hole in the distal femur and the canal is thoroughly irrigated with sterile saline to remove the fatty contents. The 5 degree Right  valgus alignment guide is placed into the femoral canal and the distal femoral cutting block is pinned to remove 10 mm off the distal femur. Resection is made with an oscillating saw.      The tibia is subluxed forward and the menisci are removed. The extramedullary alignment guide is placed referencing proximally at the medial aspect of the tibial tubercle and distally along the second metatarsal axis and tibial crest. The block is pinned to remove 34mm off the more deficient medial  side. Resection is made with an oscillating saw. Size 3is the most appropriate size for the tibia and the proximal tibia is prepared with the modular drill and keel punch for that size.      The femoral sizing guide is placed and size 4 narrow is most appropriate. Rotation is marked off the epicondylar axis and confirmed by creating a rectangular flexion gap at 90 degrees. The size 4 cutting block is pinned in this rotation and the anterior, posterior and chamfer cuts are made with the oscillating saw. The intercondylar block is then placed and that cut is made.      Trial size 3 tibial component, trial size 4 narrow posterior stabilized femur and a 10  mm posterior stabilized rotating  platform insert trial is placed. Full extension is achieved with excellent varus/valgus and anterior/posterior balance throughout full range of motion. The patella is everted and thickness measured to be 22  mm. Free hand resection is taken to 12 mm, a 38 template is placed, lug holes are drilled, trial patella is placed, and it tracks normally. Osteophytes are removed off the posterior femur with the trial in place.  All trials are removed and the cut bone surfaces prepared with pulsatile lavage. Cement is mixed and once ready for implantation, the size 3 tibial implant, size  4 narrow posterior stabilized femoral component, and the size 38 patella are cemented in place and the patella is held with the clamp. The trial insert is placed and the knee held in full extension. The Exparel (20 ml mixed with 30 ml saline) and .25% Bupivicaine, are injected into the extensor mechanism, posterior capsule, medial and lateral gutters and subcutaneous tissues.  All extruded cement is removed and once the cement is hard the permanent 10 mm posterior stabilized rotating platform insert is placed into the tibial tray.      The wound is copiously irrigated with saline solution and the extensor mechanism closed over a hemovac drain with #1 PDS suture. The tourniquet is released for a total tourniquet time of 30  minutes. Flexion against gravity is 140 degrees and the patella tracks normally. Subcutaneous tissue is closed with 2.0 vicryl and subcuticular with running 4.0 Monocryl. The incision is cleaned and dried and steri-strips and a bulky sterile dressing are applied. The limb is placed into a knee immobilizer and the patient is awakened and transported to recovery in stable condition.      Please note that a surgical assistant was a medical necessity for this procedure in order to perform it in a safe and expeditious manner. Surgical assistant was necessary to retract the ligaments and vital neurovascular structures to prevent injury to them and also necessary for proper positioning of the limb to allow for anatomic placement of the prosthesis.   Dione Plover Krista Nay, MD    08/04/2013, 1:43 PM

## 2013-08-04 NOTE — Plan of Care (Signed)
Problem: Consults Goal: Diagnosis- Total Joint Replacement Right total knee     

## 2013-08-04 NOTE — Interval H&P Note (Signed)
History and Physical Interval Note:  08/04/2013 11:46 AM  [name]  has presented today for surgery, with the diagnosis of OSTEOARTHRITIS RIGHT KNEE  The various methods of treatment have been discussed with the patient and family. After consideration of risks, benefits and other options for treatment, the patient has consented to  Procedure(s): RIGHT TOTAL KNEE ARTHROPLASTY (Right) as a surgical intervention .  The patient's history has been reviewed, patient examined, no change in status, stable for surgery.  I have reviewed the patient's chart and labs.  Questions were answered to the patient's satisfaction.     Gearlean Alf

## 2013-08-04 NOTE — Progress Notes (Signed)
UR completed 

## 2013-08-04 NOTE — H&P (View-Only) (Signed)
[name]. Holik  DOB: [birth year] Single / Language: [name] / Race: White Female  Date of Admission:  08-04-2013  Chief Complaint:  Right Knee Pain  History of Present Illness The patient is a 80 year old female who comes in for a preoperative History and Physical. The patient is scheduled for a right total knee arthroplasty to be performed by Dr. Dione Plover. Aluisio, MD at [facility] on 08/04/2013. The patient is a 80 year old female who presents for follow up of their knee. The patient is being followed for their bilateral knee pain and osteoarthritis (of the right). Symptoms reported today include: pain and swelling. [name] comes in for a recheck of her right knee pain. She has osoteoarthritis of the right knee.  She is doing very well in regards to her left total knee other than the fact that she has started to have some achiness on the left knee recently because she is compensating for the right knee pain. She has been having more trouble with the right knee giving out on her. She has had two falls because of her knee. She did not land directly on her knees. She has noticed a "knot" on her left knee since the fall. It is not tender or painful but she is concerned that something could have come apart in her left total knee. She denies groin pain as well as numbness and tingling in the legs. She feels that the right knee has gotten to a point that she needs to have the right knee replaced because not only is it painful and limiting her activities, but it is giving out on her. She is now ready to proceed with the right knee surgery. They have been treated conservatively in the past for the above stated problem and despite conservative measures, they continue to have progressive pain and severe functional limitations and dysfunction. They have failed non-operative management including home exercise, medications, and injections. It is felt that they would benefit from undergoing total  joint replacement. Risks and benefits of the procedure have been discussed with the patient and they elect to proceed with surgery. There are no active contraindications to surgery such as ongoing infection or rapidly progressive neurological disease.   Allergies Sulfa Drugs. Rash. Codeine/Codeine Derivatives. Nausea.   Problem List/Past MedicalHistory Fracture, Proximal Humerus, closed (812.09) S/P arthroscopy of knee (V45.89) Sprain/Strain, Wrist (842.00) S/P Left total knee arthroplasty (V43.65) Primary osteoarthritis of one knee (715.16) Essential tremor (333.1) Tear, medial meniscus, knee, current (836.0). 12/20/2010 Gastroesophageal Reflux Disease Osteoarthritis Hiatal Hernia Hemorrhoids Obesity Cataract. Left Eye   Family History Cancer. grandfather mothers side Cerebrovascular Accident. mother Kidney disease. father    Social History Alcohol use. current drinker; drinks wine; 8-14 per week Exercise. Exercises rarely Illicit drug use. no Drug/Alcohol Rehab (Previously). no Tobacco use. Never smoker. never smoker Living situation. live alone Marital status. widowed Tobacco / smoke exposure. no Number of flights of stairs before winded. 1 Children. 4 Drug/Alcohol Rehab (Currently). 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 Plan is to go to Rehab after the [place]. Advance Directives. Living Will, Healthcare POA    Medication History Aleve Active. Glucosamine chondroitin Active. Valium (5MG  Tablet, Oral) Active. Primidone (50MG  Tablet, 1/2 tablet Oral three times daily) Active. Levsin (0.125MG  Tablet, Oral) Active. NexIUM (40MG  Capsule DR, Oral daily) Active. Centrum Silver ( Oral) Active. Calcium 600 (1500MG  Tablet, Oral) Active.   Past Surgical History Foot Surgery. right Arthroscopy of Knee.  bilateral Total Knee Replacement - Left. Date: 03/13/2012.   Review of  Systems General:Not Present- Chills, Fever, Night Sweats, Fatigue, Weight Gain, Weight Loss and Memory Loss. Skin:Not Present- Hives, Itching, Rash, Eczema and Lesions. HEENT:Not Present- Tinnitus, Headache, Double Vision, Visual Loss, Hearing Loss and Dentures. Respiratory:Not Present- Shortness of breath with exertion, Shortness of breath at rest, Allergies, Coughing up blood and Chronic Cough. Cardiovascular:Not Present- Chest Pain, Racing/skipping heartbeats, Difficulty Breathing Lying Down, Murmur, Swelling and Palpitations. Gastrointestinal:Present- Heartburn, Constipation and Diarrhea. Not Present- Bloody Stool, Abdominal Pain, Vomiting, Nausea, Difficulty Swallowing, Jaundice and Loss of appetitie. Female Genitourinary:Not Present- Blood in Urine, Urinary frequency, Weak urinary stream, Discharge, Flank Pain, Incontinence, Painful Urination, Urgency, Urinary Retention and Urinating at Night. Musculoskeletal:Present- Joint Pain. Not Present- Muscle Weakness, Muscle Pain, Joint Swelling, Back Pain, Morning Stiffness and Spasms. Neurological:Present- Tremor (History of essential tremors). Not Present- Dizziness, Blackout spells, Paralysis, Difficulty with balance and Weakness. Psychiatric:Not Present- Insomnia.    Vitals Pulse: 64 (Regular) Resp.: 14 (Unlabored) BP: 158/72 (Sitting, Right Arm, Standard)  Physical Exam The physical exam findings are as follows:   General Mental Status - Alert, cooperative and good historian. General Appearance- pleasant. Not in acute distress. Orientation- Oriented X3. Build & Nutrition- Well nourished and Well developed.   Head and Neck Head- normocephalic, atraumatic . Neck Global Assessment- supple. no bruit auscultated on the right and no bruit auscultated on the left.   Eye Vision- Wears corrective lenses. Pupil- Bilateral- Regular and Round. Motion- Bilateral- EOMI.   Chest and Lung  Exam Auscultation: Breath sounds:- clear at anterior chest wall and - clear at posterior chest wall. Adventitious sounds:- No Adventitious sounds.   Cardiovascular Auscultation:Rhythm- Regular rate and rhythm. Heart Sounds- S1 WNL and S2 WNL. Murmurs & Other Heart Sounds:Auscultation of the heart reveals - No Murmurs.   Abdomen Palpation/Percussion:Tenderness- Abdomen is non-tender to palpation. Rigidity (guarding)- Abdomen is soft. Auscultation:Auscultation of the abdomen reveals - Bowel sounds normal.   Female Genitourinary Not done, not pertinent to present illness  Musculoskeletal  Alert and oriented and in no acute distress. Left knee nontender to palpation. 0 to 125 degrees. No effusion or instability. BB sized nodule palpated over the patella. Patella is tracking well. Incision healed with no signs of infection. No erythema. Right knee has a significant varus deformity. Range 5 to 110. Marked crepitus on range of motion. Tender mediall greater than laterally. No effusion noted. No instability noted. Sensation and circulation intact in LE. No motor deficits in LE. Calves soft and nontender.  Radiographs: AP and lateral views of the right knee shows bone on bone arthritis in the medial compartment of the right knee with a significant varus deformity. The left total knee appears to be in excellent position with no periprosthetic abnormalities.   Assessment & Plan S/P Left total knee arthroplasty (V43.65)  Primary osteoarthritis of one knee (715.16) Impression: Right Knee  Note: Plan is for a Right Total Knee Replacement by Dr. Aluisio.  Plan is to go [facility] following the hospital stay.  PCP - Dr. Bouska - Patient has been seen preoperatively and felt to be stable for surgery.  The patient does not have any contraindications and will receive TXA (tranexamic acid) prior to surgery.  Signed electronically by Daevion Navarette L Koby Pickup, III PA-C  

## 2013-08-04 NOTE — Anesthesia Procedure Notes (Signed)
Spinal  Patient location during procedure: OR Start time: 08/04/2013 12:45 PM End time: 08/04/2013 12:51 PM Staffing CRNA/Resident: Anne Fu Performed by: resident/CRNA  Preanesthetic Checklist Completed: patient identified, site marked, surgical consent, pre-op evaluation, timeout performed, IV checked, risks and benefits discussed and monitors and equipment checked Spinal Block Patient position: sitting Prep: Betadine Patient monitoring: heart rate, continuous pulse ox and blood pressure Location: L2-3 Injection technique: single-shot Needle Needle type: Spinocan  Needle gauge: 22 G Needle length: 9 cm Assessment Sensory level: T4 Additional Notes Expiration date of kit checked and confirmed. Patient tolerated procedure well, without complications. X 1 attempt with noted clear CSF return, easy aspiration and administration of medication. Noted T4 level on exam.

## 2013-08-04 NOTE — Transfer of Care (Signed)
Immediate Anesthesia Transfer of Care Note  Patient: [name]  Procedure(s) Performed: Procedure(s) (LRB): RIGHT TOTAL KNEE ARTHROPLASTY (Right)  Patient Location: PACU  Anesthesia Type: Spinal  Level of Consciousness: sedated, patient cooperative and responds to stimulation  Airway & Oxygen Therapy: Patient Spontanous Breathing and Patient connected to face mask oxgen  Post-op Assessment: Report given to PACU RN and Post -op Vital signs reviewed and stable  Post vital signs: Reviewed and stable, T-12 level on release to PACU denied pain on assessment.   Complications: No apparent anesthesia complications

## 2013-08-04 NOTE — Anesthesia Preprocedure Evaluation (Addendum)
Anesthesia Evaluation  Patient identified by MRN, date of birth, ID band Patient awake    Reviewed: Allergy & Precautions, H&P , NPO status , Patient's Chart, lab work & pertinent test results  Airway Mallampati: II TM Distance: >3 FB Neck ROM: Full    Dental no notable dental hx.    Pulmonary neg pulmonary ROS,  breath sounds clear to auscultation  Pulmonary exam normal       Cardiovascular negative cardio ROS  Rhythm:Regular Rate:Normal     Neuro/Psych Anxiety Depression Essential tremor negative neurological ROS     GI/Hepatic Neg liver ROS, hiatal hernia, GERD-  ,  Endo/Other  negative endocrine ROS  Renal/GU negative Renal ROS  negative genitourinary   Musculoskeletal negative musculoskeletal ROS (+)   Abdominal   Peds negative pediatric ROS (+)  Hematology   Anesthesia Other Findings   Reproductive/Obstetrics negative OB ROS                           Anesthesia Physical Anesthesia Plan  ASA: II  Anesthesia Plan: Spinal   Post-op Pain Management:    Induction: Intravenous  Airway Management Planned: Simple Face Mask  Additional Equipment:   Intra-op Plan:   Post-operative Plan:   Informed Consent: I have reviewed the patients History and Physical, chart, labs and discussed the procedure including the risks, benefits and alternatives for the proposed anesthesia with the patient or authorized representative who has indicated his/her understanding and acceptance.   Dental advisory given  Plan Discussed with: CRNA  Anesthesia Plan Comments:         Anesthesia Quick Evaluation

## 2013-08-05 LAB — BASIC METABOLIC PANEL
BUN: 12 mg/dL (ref 6–23)
CALCIUM: 8.5 mg/dL (ref 8.4–10.5)
CO2: 26 mEq/L (ref 19–32)
Chloride: 104 mEq/L (ref 96–112)
Creatinine, Ser: 0.74 mg/dL (ref 0.50–1.10)
GFR, EST NON AFRICAN AMERICAN: 80 mL/min — AB (ref >90)
Glucose, Bld: 130 mg/dL — ABNORMAL HIGH (ref 70–99)
Potassium: 5.2 mEq/L (ref 3.7–5.3)
SODIUM: 139 meq/L (ref 137–147)

## 2013-08-05 LAB — CBC
HCT: 34.7 % — ABNORMAL LOW (ref 36.0–46.0)
Hemoglobin: 12.1 g/dL (ref 12.0–15.0)
MCH: 33.8 pg (ref 26.0–34.0)
MCHC: 34.9 g/dL (ref 30.0–36.0)
MCV: 96.9 fL (ref 78.0–100.0)
PLATELETS: 149 10*3/uL — AB (ref 150–400)
RBC: 3.58 MIL/uL — AB (ref 3.87–5.11)
RDW: 12.3 % (ref 11.5–15.5)
WBC: 8.5 10*3/uL (ref 4.0–10.5)

## 2013-08-05 MED ORDER — ESOMEPRAZOLE MAGNESIUM 40 MG PO CPDR
40.0000 mg | DELAYED_RELEASE_CAPSULE | Freq: Every day | ORAL | Status: DC
  Administered 2013-08-05 – 2013-08-07 (×3): 40 mg via ORAL
  Filled 2013-08-05 (×3): qty 1

## 2013-08-05 MED ORDER — NON FORMULARY: 40.0000 mg | Freq: Every day | Status: DC

## 2013-08-05 MED ORDER — ENOXAPARIN SODIUM 30 MG/0.3ML ~~LOC~~ SOLN
30.0000 mg | Freq: Two times a day (BID) | SUBCUTANEOUS | Status: DC
  Administered 2013-08-05 – 2013-08-07 (×5): 30 mg via SUBCUTANEOUS
  Filled 2013-08-05 (×7): qty 0.3

## 2013-08-05 NOTE — Care Management Note (Signed)
    Page 1 of 1   08/05/2013     4:23:44 PM   CARE MANAGEMENT NOTE 08/05/2013  Patient:  [name], [name]   Account Number:  [id number]  Date Initiated:  08/05/2013  Documentation initiated by:  Sherrin Daisy  Subjective/Objective Assessment:   dx total rt knee replacemnt     Action/Plan:   Pt plans SNF rehab   Anticipated DC Date:  08/07/2013   Anticipated DC Plan:  SKILLED NURSING FACILITY  In-house referral  Clinical Social Worker      DC Planning Services  CM consult      Choice offered to / List presented to:             Status of service:  Completed, signed off Medicare Important Message given?  NA - LOS <3 / Initial given by admissions (If response is "NO", the following Medicare IM given date fields will be blank) Date Medicare IM given:   Date Additional Medicare IM given:    Discharge Disposition:    Per UR Regulation:  Reviewed for med. necessity/level of care/duration of stay  If discussed at [place] of Stay Meetings, dates discussed:    Comments:

## 2013-08-05 NOTE — Progress Notes (Signed)
   Subjective: 1 Day Post-Op Procedure(s) (LRB): RIGHT TOTAL KNEE ARTHROPLASTY (Right) Patient reports pain as mild.   Patient seen in rounds with Dr. Wynelle Link.  Family in room at bedside. Patient is well, but has had some minor complaints of pain in the knee, requiring pain medications We will start therapy today.  Plan is to go [facility] after hospital stay.  Objective: Vital signs in last 24 hours: Temp:  [96.1 F (35.6 C)-98.1 F (36.7 C)] 97.5 F (36.4 C) (01/20 0615) Pulse Rate:  [54-98] 54 (01/20 0615) Resp:  [9-21] 20 (01/20 0615) BP: (107-147)/(41-76) 107/68 mmHg (01/20 0615) SpO2:  [96 %-100 %] 98 % (01/20 0615) Weight:  [78.019 kg (172 lb)] 78.019 kg (172 lb) (01/19 1545)  Intake/Output from previous day:  Intake/Output Summary (Last 24 hours) at 08/05/13 0757 Last data filed at 08/05/13 0615  Gross per 24 hour  Intake   3930 ml  Output   3260 ml  Net    670 ml    Intake/Output this shift: UOP 1000 since MN +670  Labs:  Recent Labs  08/05/13 0423  HGB 12.1    Recent Labs  08/05/13 0423  WBC 8.5  RBC 3.58*  HCT 34.7*  PLT 149*    Recent Labs  08/05/13 0423  NA 139  K 5.2  CL 104  CO2 26  BUN 12  CREATININE 0.74  GLUCOSE 130*  CALCIUM 8.5   No results found for this basename: LABPT, INR,  in the last 72 hours  EXAM General - Patient is Alert, Appropriate and Oriented Extremity - Neurovascular intact Sensation intact distally Dorsiflexion/Plantar flexion intact Dressing - dressing C/D/I Motor Function - intact, moving foot and toes well on exam.  Hemovac pulled without difficulty.  Past Medical History  Diagnosis Date  . Hx of colonic polyps   . Fracture of humerus, proximal, left, closed October 28, 2011    pt has finished physical therapy-limited ROM reaching to her back and unable to lift heavy things with left hand/arm  . Essential tremor     of head, sometimes hands.  pt takes primidone to help the tremors-DR.  MILLER-NEUROLOGIST IN [place].  PT WAS STARTED ON VALUIM MANY YEARS AGO FOR HER TREMORS--AND CONTINUES TO TAKE.  . Osteoarthritis     PAIN AND OA BILATERAL KNEES; ALSO HAS ARTHRITIS IN HANDS AND BACK BUT NO BACK PAIN  . GERD (gastroesophageal reflux disease)   . H/O hiatal hernia     Assessment/Plan: 1 Day Post-Op Procedure(s) (LRB): RIGHT TOTAL KNEE ARTHROPLASTY (Right) Active Problems:   OA (osteoarthritis) of knee  Estimated body mass index is 29.51 kg/(m^2) as calculated from the following:   Height as of this encounter: 5\' 4"  (1.626 m).   Weight as of this encounter: 78.019 kg (172 lb). Advance diet Up with therapy Discharge to SNF  DVT Prophylaxis - Lovenox twice a day while in the hospital and will switch over to daily at rehab.  She will take for ten days and then transition over to Aspirin.  (Unable to take Xarelto due to a drug interaction with primidone). Weight-Bearing as tolerated to right leg D/C O2 and Pulse OX and try on Room Air  PERKINS, Augusto Garbe 08/05/2013, 7:57 AM

## 2013-08-05 NOTE — Progress Notes (Signed)
Clinical Social Work Department CLINICAL SOCIAL WORK PLACEMENT NOTE 08/05/2013  Patient:  [name], [name]  Account Number:  [id number] Admit date:  08/04/2013  Clinical Social Worker:  Werner Lean, LCSW  Date/time:  08/05/2013 02:47 PM  Clinical Social Work is seeking post-discharge placement for this patient at the following level of care:   SKILLED NURSING   (*CSW will update this form in Epic as items are completed)     Patient/family provided with [place] Department of Clinical Social Work's list of facilities offering this level of care within the geographic area requested by the patient (or if unable, by the patient's family).  08/05/2013  Patient/family informed of their freedom to choose among providers that offer the needed level of care, that participate in Medicare, Medicaid or managed care program needed by the patient, have an available bed and are willing to accept the patient.    Patient/family informed of [facility]' ownership interest in [facility], as well as of the fact that they are under no obligation to receive care at this facility.  PASARR submitted to EDS on  PASARR number received from EDS on 03/14/2012  FL2 transmitted to all facilities in geographic area requested by pt/family on  08/05/2013 FL2 transmitted to all facilities within larger geographic area on   Patient informed that his/her managed care company has contracts with or will negotiate with  certain facilities, including the following:     Patient/family informed of bed offers received:  08/05/2013 Patient chooses bed at [facility] Physician recommends and patient chooses bed at    Patient to be transferred to [place] on   Patient to be transferred to facility by   The following physician request were entered in Epic:   Additional Comments:  Werner Lean LCSW [phone]

## 2013-08-05 NOTE — Evaluation (Signed)
Physical Therapy Evaluation Patient Details Name: [name] MRN: [id number] DOB: August 13, 1933 Today's Date: 08/05/2013 Time: 6659-9357 PT Time Calculation (min): 20 min  PT Assessment / Plan / Recommendation History of Present Illness  s/p R TKR  Clinical Impression  Pt is s/p R TKA resulting in the deficits listed below (see PT Problem List).  Pt will benefit from skilled PT to increase their independence and safety with mobility to allow discharge to the venue listed below.  Pt able to ambulate in hallway this morning however reports moderate pain so assisted to recliner.  Pt plans to d/c to SNF, hoping for [facility] as she went there for rehab after her previous L TKR.     PT Assessment  Patient needs continued PT services    Follow Up Recommendations  SNF    Does the patient have the potential to tolerate intense rehabilitation      Barriers to Discharge        Equipment Recommendations  None recommended by PT    Recommendations for Other Services     Frequency 7X/week    Precautions / Restrictions Precautions Precautions: Knee Required Braces or Orthoses: Knee Immobilizer - Right Knee Immobilizer - Right: Discontinue once straight leg raise with < 10 degree lag Restrictions Other Position/Activity Restrictions: WBAT   Pertinent Vitals/Pain Moderate R knee pain, notified RN via call bell, ice packs applied, repositioned      Mobility  Bed Mobility Overal bed mobility: Needs Assistance Bed Mobility: Supine to Sit Supine to sit: Min assist;HOB elevated General bed mobility comments: verbal cues for technique, assist for R LE Transfers Overall transfer level: Needs assistance Equipment used: Rolling walker (2 wheeled) Transfers: Sit to/from Stand Sit to Stand: Min assist General transfer comment: verbal cues for technique, assist to rise and steady Ambulation/Gait Ambulation/Gait assistance: Min assist Ambulation Distance (Feet): 80 Feet Assistive  device: Rolling walker (2 wheeled) Gait Pattern/deviations: Step-to pattern;Antalgic Gait velocity: decr General Gait Details: verbal cues for safe technique, RW distance, WBing through UE to assist with pain control    Exercises     PT Diagnosis: Difficulty walking;Acute pain  PT Problem List: Decreased strength;Decreased range of motion;Decreased mobility;Decreased knowledge of use of DME;Decreased knowledge of precautions;Pain;Decreased activity tolerance PT Treatment Interventions: Gait training;DME instruction;Patient/family education;Functional mobility training;Therapeutic activities;Therapeutic exercise     PT Goals(Current goals can be found in the care plan section) Acute Rehab PT Goals PT Goal Formulation: With patient Time For Goal Achievement: 08/12/13 Potential to Achieve Goals: Good  Visit Information  Last PT Received On: 08/05/13 Assistance Needed: +1 History of Present Illness: s/p R TKR       Prior Functioning  Home Living Family/patient expects to be discharged to:: Skilled nursing facility Living Arrangements: Alone Prior Function Level of Independence: Independent with assistive device(s) Communication Communication: No difficulties    Cognition  Cognition Arousal/Alertness: Awake/alert Behavior During Therapy: WFL for tasks assessed/performed Overall Cognitive Status: Within Functional Limits for tasks assessed    Extremity/Trunk Assessment Lower Extremity Assessment Lower Extremity Assessment: RLE deficits/detail RLE Deficits / Details: unable to perform SLR, maintained KI   Balance    End of Session PT - End of Session Equipment Utilized During Treatment: Right knee immobilizer Activity Tolerance: Patient limited by pain Patient left: in chair;with call bell/phone within reach;with family/visitor present  GP     Chealsea Paske,[name] 08/05/2013, 12:34 PM Carmelia Bake, PT, DPT 08/05/2013 Pager: [phone]

## 2013-08-05 NOTE — Progress Notes (Signed)
Physical Therapy Treatment Note   08/05/13 1500  PT Visit Information  Last PT Received On 08/05/13  Assistance Needed +1  History of Present Illness s/p R TKR  PT Time Calculation  PT Start Time 1408  PT Stop Time 1432  PT Time Calculation (min) 24 min  Subjective Data  Subjective Pt ambulated in hallway again and performed exercises in bed.  Precautions  Precautions Knee  Required Braces or Orthoses Knee Immobilizer - Right  Knee Immobilizer - Right Discontinue once straight leg raise with < 10 degree lag  Restrictions  Other Position/Activity Restrictions WBAT  Cognition  Arousal/Alertness Awake/alert  Behavior During Therapy WFL for tasks assessed/performed  Overall Cognitive Status Within Functional Limits for tasks assessed  Bed Mobility  Overal bed mobility Needs Assistance  Bed Mobility Sit to Supine  Sit to supine Min assist  General bed mobility comments verbal cues for technique, assist for R LE  Transfers  Overall transfer level Needs assistance  Equipment used Rolling walker (2 wheeled)  Transfers Sit to/from Stand  Sit to Stand Min guard  General transfer comment verbal cues for technique  Ambulation/Gait  Ambulation/Gait assistance Min guard  Ambulation Distance (Feet) 100 Feet  Assistive device Rolling walker (2 wheeled)  Gait Pattern/deviations Step-to pattern;Antalgic  Gait velocity decr  General Gait Details verbal cues for safe technique, RW distance, WBing through UE to assist with pain control  Exercises  Exercises Total Joint  Total Joint Exercises  Ankle Circles/Pumps AROM;Both;15 reps  Quad Sets AROM;Right;15 reps  Short Arc [employer];Right;15 reps  Heel Slides AAROM;Right;15 reps  Hip ABduction/ADduction AAROM;Right;15 reps  Straight Leg Raises AAROM;Right;10 reps  PT - End of Session  Equipment Utilized During Treatment Right knee immobilizer  Activity Tolerance Patient limited by pain  Patient left in bed;with call bell/phone within  reach;with family/visitor present  PT - Assessment/Plan  PT Plan Current plan remains appropriate  PT Frequency 7X/week  Follow Up Recommendations SNF  PT equipment None recommended by PT  PT Goal Progression  Progress towards PT goals Progressing toward goals  PT General Charges  $$ ACUTE PT VISIT 1 Procedure  PT Treatments  $Gait Training 8-22 mins  $Therapeutic Exercise 8-22 mins   Carmelia Bake, PT, DPT 08/05/2013 Pager: [phone]

## 2013-08-05 NOTE — Progress Notes (Signed)
OT Cancellation Note  Patient Details Name: [name] MRN: [id number] DOB: [birth year]   Cancelled Treatment:    Reason Eval/Treat Not Completed: Other (comment)  Pt plans snf for rehab.  Will defer ot to that venue  [place] 08/05/2013, 11:38 AM Lesle Chris, OTR/L [phone] 08/05/2013

## 2013-08-05 NOTE — Progress Notes (Signed)
Clinical Social Work Department BRIEF PSYCHOSOCIAL ASSESSMENT 08/05/2013  Patient:  [name], [name]     Account Number:  [id number]     Admit date:  08/04/2013  Clinical Social Worker:  Lacie Scotts  Date/Time:  08/05/2013 02:39 PM  Referred by:  Physician  Date Referred:  08/05/2013 Referred for  SNF Placement   Other Referral:   Interview type:  Patient Other interview type:    PSYCHOSOCIAL DATA Living Status:  ALONE Admitted from facility:   Level of care:   Primary support name:  [name] Primary support relationship to patient:  CHILD, ADULT Degree of support available:   unclear    CURRENT CONCERNS Current Concerns  Post-Acute Placement   Other Concerns:    SOCIAL WORK ASSESSMENT / PLAN Pt is a 80 yr old female living at home prior to hospitalization. CSW met with pt to assist with d/c planning. Pt has made prior arrangements to have [facility] Rehab at [facility] following hospital d/c. CSW has contacted SNF and d/c plans have been confirmed. CSW will continue to follow to assist with d/c planning needs.   Assessment/plan status:  Psychosocial Support/Ongoing Assessment of Needs Other assessment/ plan:   Information/referral to community resources:   Insurance coverage for SNF and Ambulance transport reviewed.    PATIENT'S/FAMILY'S RESPONSE TO PLAN OF CARE: " I'm looking forward to feeling better and having rehab at [facility] LCSW [phone]

## 2013-08-06 LAB — CBC
HCT: 30.6 % — ABNORMAL LOW (ref 36.0–46.0)
Hemoglobin: 10.4 g/dL — ABNORMAL LOW (ref 12.0–15.0)
MCH: 33.4 pg (ref 26.0–34.0)
MCHC: 34 g/dL (ref 30.0–36.0)
MCV: 98.4 fL (ref 78.0–100.0)
Platelets: 137 10*3/uL — ABNORMAL LOW (ref 150–400)
RBC: 3.11 MIL/uL — AB (ref 3.87–5.11)
RDW: 12.6 % (ref 11.5–15.5)
WBC: 7.2 10*3/uL (ref 4.0–10.5)

## 2013-08-06 LAB — BASIC METABOLIC PANEL
BUN: 16 mg/dL (ref 6–23)
CHLORIDE: 103 meq/L (ref 96–112)
CO2: 30 meq/L (ref 19–32)
CREATININE: 0.92 mg/dL (ref 0.50–1.10)
Calcium: 8.8 mg/dL (ref 8.4–10.5)
GFR calc Af Amer: 68 mL/min — ABNORMAL LOW (ref >90)
GFR calc non Af Amer: 59 mL/min — ABNORMAL LOW (ref >90)
Glucose, Bld: 122 mg/dL — ABNORMAL HIGH (ref 70–99)
Potassium: 4.5 mEq/L (ref 3.7–5.3)
SODIUM: 140 meq/L (ref 137–147)

## 2013-08-06 NOTE — Progress Notes (Signed)
08/06/13 1400  PT Visit Information  Last PT Received On 08/06/13  Assistance Needed +1  History of Present Illness s/p R TKR  PT Time Calculation  PT Start Time 1334  PT Stop Time 1401  PT Time Calculation (min) 27 min  Subjective Data  Subjective I feel better  Precautions  Precautions Knee  Required Braces or Orthoses Knee Immobilizer - Right  Knee Immobilizer - Right Discontinue once straight leg raise with < 10 degree lag  Restrictions  Other Position/Activity Restrictions WBAT  Cognition  Arousal/Alertness Awake/alert  Behavior During Therapy WFL for tasks assessed/performed  Overall Cognitive Status Within Functional Limits for tasks assessed  Bed Mobility  Overal bed mobility Needs Assistance  Bed Mobility Sit to Supine  Sit to supine Supervision  General bed mobility comments verbal cues for self assist of R LE  Transfers  Overall transfer level Needs assistance  Equipment used Rolling walker (2 wheeled)  Transfers Sit to/from Stand  Sit to Stand Min guard;Min assist  General transfer comment verbal cues for technique; min assist to control descent onto bed (pt sat unexpectedly)  Ambulation/Gait  Ambulation/Gait assistance Min guard  Ambulation Distance (Feet) 138 Feet  Assistive device Rolling walker (2 wheeled)  Gait Pattern/deviations Step-to pattern  Gait velocity decr  General Gait Details verbal cues for safe technique, RW distance, WBing through UE to assist with pain control  Total Joint Exercises  Ankle Circles/Pumps AROM;Both;15 reps  Quad Sets AROM;Right;15 reps  Heel Slides AAROM;Right;15 reps  Hip ABduction/ADduction AAROM;Right;15 reps  Straight Leg Raises AAROM;Right;10 reps  PT - End of Session  Equipment Utilized During Treatment Right knee immobilizer  Activity Tolerance Patient tolerated treatment well  Patient left in bed;with call bell/phone within reach  Nurse Communication Mobility status  PT - Assessment/Plan  PT Plan Current plan  remains appropriate  PT Frequency 7X/week  Follow Up Recommendations SNF  PT equipment None recommended by PT  PT Goal Progression  Progress towards PT goals Progressing toward goals  Acute Rehab PT Goals  Time For Goal Achievement 08/12/13  Potential to Achieve Goals Good  PT General Charges  $$ ACUTE PT VISIT 1 Procedure  PT Treatments  $Gait Training 8-22 mins  $Therapeutic Exercise 8-22 mins

## 2013-08-06 NOTE — Progress Notes (Signed)
Physical Therapy Treatment Patient Details Name: [name] MRN: [id number] DOB: [birth year] Today's Date: 08/06/2013 Time: 0175-1025 PT Time Calculation (min): 12 min  PT Assessment / Plan / Recommendation  History of Present Illness s/p R TKR   PT Comments   Pt assisted to bathroom however in too much pain to tolerate hallway ambulation or exercises this morning.  Follow Up Recommendations  SNF     Does the patient have the potential to tolerate intense rehabilitation     Barriers to Discharge        Equipment Recommendations  None recommended by PT    Recommendations for Other Services    Frequency 7X/week   Progress towards PT Goals Progress towards PT goals: Progressing toward goals  Plan Current plan remains appropriate    Precautions / Restrictions Precautions Precautions: Knee Required Braces or Orthoses: Knee Immobilizer - Right Knee Immobilizer - Right: Discontinue once straight leg raise with < 10 degree lag Restrictions Other Position/Activity Restrictions: WBAT   Pertinent Vitals/Pain Increased R knee pain, repositioned, RN notified via call bell, ice packs applied, pt reported some relief with ice packs    Mobility  Bed Mobility Overal bed mobility: Needs Assistance Bed Mobility: Supine to Sit Sit to supine: Supervision General bed mobility comments: verbal cues for self assist of R LE Transfers Overall transfer level: Needs assistance Equipment used: Rolling walker (2 wheeled) Transfers: Sit to/from Stand Sit to Stand: Min guard General transfer comment: verbal cues for technique Ambulation/Gait Ambulation/Gait assistance: Min guard Ambulation Distance (Feet): 20 Feet (around room) Assistive device: Rolling walker (2 wheeled) Gait Pattern/deviations: Step-to pattern;Antalgic Gait velocity: decr General Gait Details: verbal cues for safe technique, RW distance, WBing through UE to assist with pain control    Exercises     PT Diagnosis:     PT Problem List:   PT Treatment Interventions:     PT Goals (current goals can now be found in the care plan section)    Visit Information  Last PT Received On: 08/06/13 Assistance Needed: +1 History of Present Illness: s/p R TKR    Subjective Data  Subjective: Pt assisted to bathroom and then to recliner.  Pt reports too much pain to tolerate anything else.     Cognition  Cognition Arousal/Alertness: Awake/alert Behavior During Therapy: WFL for tasks assessed/performed Overall Cognitive Status: Within Functional Limits for tasks assessed    Balance     End of Session PT - End of Session Equipment Utilized During Treatment: Right knee immobilizer Activity Tolerance: Patient limited by pain Patient left: in chair;with call bell/phone within reach   GP     Leanna Hamid,[name] 08/06/2013, 1:02 PM Carmelia Bake, PT, DPT 08/06/2013 Pager: [phone]

## 2013-08-06 NOTE — Progress Notes (Signed)
   Subjective: 2 Days Post-Op Procedure(s) (LRB): RIGHT TOTAL KNEE ARTHROPLASTY (Right) Patient reports pain as mild.   Patient seen in rounds with Dr. Wynelle Link. Patient is well, but has had some minor complaints of pain in the knee, requiring pain medications Plan is to go [facility] after hospital stay.  Objective: Vital signs in last 24 hours: Temp:  [97.3 F (36.3 C)-98.2 F (36.8 C)] 98.2 F (36.8 C) (01/20 2100) Pulse Rate:  [55-70] 70 (01/20 2100) Resp:  [16-18] 16 (01/20 2100) BP: (110-146)/(70-78) 146/78 mmHg (01/20 2100) SpO2:  [98 %] 98 % (01/20 2100)  Intake/Output from previous day:  Intake/Output Summary (Last 24 hours) at 08/06/13 0730 Last data filed at 08/05/13 2209  Gross per 24 hour  Intake 1428.33 ml  Output   2350 ml  Net -921.67 ml    Intake/Output this shift:    Labs:  Recent Labs  08/05/13 0423 08/06/13 0410  HGB 12.1 10.4*    Recent Labs  08/05/13 0423 08/06/13 0410  WBC 8.5 7.2  RBC 3.58* 3.11*  HCT 34.7* 30.6*  PLT 149* 137*    Recent Labs  08/05/13 0423 08/06/13 0410  NA 139 140  K 5.2 4.5  CL 104 103  CO2 26 30  BUN 12 16  CREATININE 0.74 0.92  GLUCOSE 130* 122*  CALCIUM 8.5 8.8   No results found for this basename: LABPT, INR,  in the last 72 hours  EXAM General - Patient is Alert and Appropriate Extremity - Neurovascular intact Sensation intact distally Dorsiflexion/Plantar flexion intact Dressing/Incision - clean, dry, no drainage, healing Motor Function - intact, moving foot and toes well on exam.   Past Medical History  Diagnosis Date  . Hx of colonic polyps   . Fracture of humerus, proximal, left, closed October 28, 2011    pt has finished physical therapy-limited ROM reaching to her back and unable to lift heavy things with left hand/arm  . Essential tremor     of head, sometimes hands.  pt takes primidone to help the tremors-DR. MILLER-NEUROLOGIST IN [place].  PT WAS STARTED ON VALUIM MANY YEARS AGO  FOR HER TREMORS--AND CONTINUES TO TAKE.  . Osteoarthritis     PAIN AND OA BILATERAL KNEES; ALSO HAS ARTHRITIS IN HANDS AND BACK BUT NO BACK PAIN  . GERD (gastroesophageal reflux disease)   . H/O hiatal hernia     Assessment/Plan: 2 Days Post-Op Procedure(s) (LRB): RIGHT TOTAL KNEE ARTHROPLASTY (Right) Active Problems:   OA (osteoarthritis) of knee  Estimated body mass index is 29.51 kg/(m^2) as calculated from the following:   Height as of this encounter: 5\' 4"  (1.626 m).   Weight as of this encounter: 172 lb (78.019 kg). Up with therapy Plan for discharge tomorrow Discharge to SNF  DVT Prophylaxis - Lovenox twice a day while in the hospital and will switch over to daily at rehab. She will take for ten days and then transition over to Aspirin. (Unable to take Xarelto due to a drug interaction with primidone). Weight-Bearing as tolerated to right leg  Mckaila Duffus V 08/06/2013, 7:30 AM

## 2013-08-07 DIAGNOSIS — D62 Acute posthemorrhagic anemia: Secondary | ICD-10-CM | POA: Diagnosis not present

## 2013-08-07 LAB — CBC
HCT: 32.9 % — ABNORMAL LOW (ref 36.0–46.0)
Hemoglobin: 11 g/dL — ABNORMAL LOW (ref 12.0–15.0)
MCH: 32.9 pg (ref 26.0–34.0)
MCHC: 33.4 g/dL (ref 30.0–36.0)
MCV: 98.5 fL (ref 78.0–100.0)
PLATELETS: 136 10*3/uL — AB (ref 150–400)
RBC: 3.34 MIL/uL — ABNORMAL LOW (ref 3.87–5.11)
RDW: 12.7 % (ref 11.5–15.5)
WBC: 5.8 10*3/uL (ref 4.0–10.5)

## 2013-08-07 MED ORDER — TRAMADOL HCL 50 MG PO TABS: 50.0000 mg | ORAL_TABLET | Freq: Four times a day (QID) | ORAL | Status: DC | PRN

## 2013-08-07 MED ORDER — ONDANSETRON HCL 4 MG PO TABS: 4.0000 mg | ORAL_TABLET | Freq: Four times a day (QID) | ORAL | Status: DC | PRN

## 2013-08-07 MED ORDER — ENOXAPARIN SODIUM 40 MG/0.4ML ~~LOC~~ SOLN: 40.0000 mg | SUBCUTANEOUS | Status: DC

## 2013-08-07 MED ORDER — BISACODYL 10 MG RE SUPP: 10.0000 mg | Freq: Every day | RECTAL | Status: DC | PRN

## 2013-08-07 MED ORDER — DSS 100 MG PO CAPS: 100.0000 mg | ORAL_CAPSULE | Freq: Two times a day (BID) | ORAL | Status: DC

## 2013-08-07 MED ORDER — POLYETHYLENE GLYCOL 3350 17 G PO PACK: 17.0000 g | PACK | Freq: Every day | ORAL | Status: DC | PRN

## 2013-08-07 MED ORDER — METHOCARBAMOL 500 MG PO TABS: 500.0000 mg | ORAL_TABLET | Freq: Four times a day (QID) | ORAL | Status: DC | PRN

## 2013-08-07 MED ORDER — HYDROCODONE-ACETAMINOPHEN 5-325 MG PO TABS: 1.0000 | ORAL_TABLET | ORAL | Status: DC | PRN

## 2013-08-07 MED ORDER — ACETAMINOPHEN 325 MG PO TABS: 650.0000 mg | ORAL_TABLET | Freq: Four times a day (QID) | ORAL | Status: DC | PRN

## 2013-08-07 MED ORDER — METOCLOPRAMIDE HCL 5 MG PO TABS: 5.0000 mg | ORAL_TABLET | Freq: Three times a day (TID) | ORAL | Status: DC | PRN

## 2013-08-07 NOTE — Discharge Summary (Signed)
Physician Discharge Summary   Patient ID: [name] MRN: [id number] DOB/AGE: Feb 20, 1934 80 y.o.  Admit date: 08/04/2013 Discharge date: 08-07-2013  Primary Diagnosis:  Osteoarthritis Right knee(s)  Admission Diagnoses:  Past Medical History  Diagnosis Date  . Hx of colonic polyps   . Fracture of humerus, proximal, left, closed October 28, 2011    pt has finished physical therapy-limited ROM reaching to her back and unable to lift heavy things with left hand/arm  . Essential tremor     of head, sometimes hands.  pt takes primidone to help the tremors-DR. MILLER-NEUROLOGIST IN [place].  PT WAS STARTED ON VALUIM MANY YEARS AGO FOR HER TREMORS--AND CONTINUES TO TAKE.  . Osteoarthritis     PAIN AND OA BILATERAL KNEES; ALSO HAS ARTHRITIS IN HANDS AND BACK BUT NO BACK PAIN  . GERD (gastroesophageal reflux disease)   . H/O hiatal hernia    Discharge Diagnoses:   Active Problems:   OA (osteoarthritis) of knee   Postoperative anemia due to acute blood loss  Estimated body mass index is 29.51 kg/(m^2) as calculated from the following:   Height as of this encounter: 5' 4" (1.626 m).   Weight as of this encounter: 78.019 kg (172 lb).  Procedure:  Procedure(s) (LRB): RIGHT TOTAL KNEE ARTHROPLASTY (Right)   Consults: None  HPI: [name] is a 80 y.o. year old female with end stage OA of her right knee with progressively worsening pain and dysfunction. She has constant pain, with activity and at rest and significant functional deficits with difficulties even with ADLs. She has had extensive non-op management including analgesics, injections of cortisone and viscosupplements, and home exercise program, but remains in significant pain with significant dysfunction.Radiographs show bone on bone arthritis medial and patellofemoral. She presents now for right Total Knee Arthroplasty.   Laboratory Data: Admission on 08/04/2013  Component Date Value Range Status  . WBC 08/05/2013 8.5   4.0 - 10.5 K/uL Final  . RBC 08/05/2013 3.58* 3.87 - 5.11 MIL/uL Final  . Hemoglobin 08/05/2013 12.1  12.0 - 15.0 g/dL Final  . HCT 08/05/2013 34.7* 36.0 - 46.0 % Final  . MCV 08/05/2013 96.9  78.0 - 100.0 fL Final  . MCH 08/05/2013 33.8  26.0 - 34.0 pg Final  . MCHC 08/05/2013 34.9  30.0 - 36.0 g/dL Final  . RDW 08/05/2013 12.3  11.5 - 15.5 % Final  . Platelets 08/05/2013 149* 150 - 400 K/uL Final  . Sodium 08/05/2013 139  137 - 147 mEq/L Final  . Potassium 08/05/2013 5.2  3.7 - 5.3 mEq/L Final  . Chloride 08/05/2013 104  96 - 112 mEq/L Final  . CO2 08/05/2013 26  19 - 32 mEq/L Final  . Glucose, Bld 08/05/2013 130* 70 - 99 mg/dL Final  . BUN 08/05/2013 12  6 - 23 mg/dL Final  . Creatinine, Ser 08/05/2013 0.74  0.50 - 1.10 mg/dL Final  . Calcium 08/05/2013 8.5  8.4 - 10.5 mg/dL Final  . GFR calc non Af Amer 08/05/2013 80* >90 mL/min Final  . GFR calc Af Amer 08/05/2013 >90  >90 mL/min Final   Comment: (NOTE)                          The eGFR has been calculated using the CKD EPI equation.                          This  calculation has not been validated in all clinical situations.                          eGFR's persistently <90 mL/min signify possible Chronic Kidney                          Disease.  . WBC 08/06/2013 7.2  4.0 - 10.5 K/uL Final  . RBC 08/06/2013 3.11* 3.87 - 5.11 MIL/uL Final  . Hemoglobin 08/06/2013 10.4* 12.0 - 15.0 g/dL Final  . HCT 08/06/2013 30.6* 36.0 - 46.0 % Final  . MCV 08/06/2013 98.4  78.0 - 100.0 fL Final  . MCH 08/06/2013 33.4  26.0 - 34.0 pg Final  . MCHC 08/06/2013 34.0  30.0 - 36.0 g/dL Final  . RDW 08/06/2013 12.6  11.5 - 15.5 % Final  . Platelets 08/06/2013 137* 150 - 400 K/uL Final  . Sodium 08/06/2013 140  137 - 147 mEq/L Final  . Potassium 08/06/2013 4.5  3.7 - 5.3 mEq/L Final  . Chloride 08/06/2013 103  96 - 112 mEq/L Final  . CO2 08/06/2013 30  19 - 32 mEq/L Final  . Glucose, Bld 08/06/2013 122* 70 - 99 mg/dL Final  . BUN 08/06/2013 16  6 -  23 mg/dL Final  . Creatinine, Ser 08/06/2013 0.92  0.50 - 1.10 mg/dL Final  . Calcium 08/06/2013 8.8  8.4 - 10.5 mg/dL Final  . GFR calc non Af Amer 08/06/2013 59* >90 mL/min Final  . GFR calc Af Amer 08/06/2013 68* >90 mL/min Final   Comment: (NOTE)                          The eGFR has been calculated using the CKD EPI equation.                          This calculation has not been validated in all clinical situations.                          eGFR's persistently <90 mL/min signify possible Chronic Kidney                          Disease.  . WBC 08/07/2013 5.8  4.0 - 10.5 K/uL Final  . RBC 08/07/2013 3.34* 3.87 - 5.11 MIL/uL Final  . Hemoglobin 08/07/2013 11.0* 12.0 - 15.0 g/dL Final  . HCT 08/07/2013 32.9* 36.0 - 46.0 % Final  . MCV 08/07/2013 98.5  78.0 - 100.0 fL Final  . MCH 08/07/2013 32.9  26.0 - 34.0 pg Final  . MCHC 08/07/2013 33.4  30.0 - 36.0 g/dL Final  . RDW 08/07/2013 12.7  11.5 - 15.5 % Final  . Platelets 08/07/2013 136* 150 - 400 K/uL Final  Hospital Outpatient Visit on 07/30/2013  Component Date Value Range Status  . MRSA, PCR 07/30/2013 NEGATIVE  NEGATIVE Final  . Staphylococcus aureus 07/30/2013 NEGATIVE  NEGATIVE Final   Comment:                                 The Xpert SA Assay (FDA                          approved  for NASAL specimens                          in patients over 38 years of age),                          is one component of                          a comprehensive surveillance                          program.  Test performance has                          been validated by [employer] for patients greater                          than or equal to 26 year old.                          It is not intended                          to diagnose infection nor to                          guide or monitor treatment.  Marland Kitchen aPTT 07/30/2013 28  24 - 37 seconds Final  . WBC 07/30/2013 5.0  4.0 - 10.5 K/uL Final  . RBC 07/30/2013  4.31  3.87 - 5.11 MIL/uL Final  . Hemoglobin 07/30/2013 14.3  12.0 - 15.0 g/dL Final  . HCT 07/30/2013 41.4  36.0 - 46.0 % Final  . MCV 07/30/2013 96.1  78.0 - 100.0 fL Final  . MCH 07/30/2013 33.2  26.0 - 34.0 pg Final  . MCHC 07/30/2013 34.5  30.0 - 36.0 g/dL Final  . RDW 07/30/2013 12.5  11.5 - 15.5 % Final  . Platelets 07/30/2013 164  150 - 400 K/uL Final  . Sodium 07/30/2013 139  137 - 147 mEq/L Final  . Potassium 07/30/2013 4.5  3.7 - 5.3 mEq/L Final  . Chloride 07/30/2013 101  96 - 112 mEq/L Final  . CO2 07/30/2013 25  19 - 32 mEq/L Final  . Glucose, Bld 07/30/2013 102* 70 - 99 mg/dL Final  . BUN 07/30/2013 19  6 - 23 mg/dL Final  . Creatinine, Ser 07/30/2013 0.84  0.50 - 1.10 mg/dL Final  . Calcium 07/30/2013 9.3  8.4 - 10.5 mg/dL Final  . Total Protein 07/30/2013 7.7  6.0 - 8.3 g/dL Final  . Albumin 07/30/2013 4.2  3.5 - 5.2 g/dL Final  . AST 07/30/2013 51* 0 - 37 U/L Final  . ALT 07/30/2013 63* 0 - 35 U/L Final  . Alkaline Phosphatase 07/30/2013 96  39 - 117 U/L Final  . Total Bilirubin 07/30/2013 0.7  0.3 - 1.2 mg/dL Final  . GFR calc non Af Amer 07/30/2013 65* >90 mL/min Final  . GFR calc Af Amer 07/30/2013 76* >90 mL/min Final   Comment: (NOTE)  The eGFR has been calculated using the CKD EPI equation.                          This calculation has not been validated in all clinical situations.                          eGFR's persistently <90 mL/min signify possible Chronic Kidney                          Disease.  Marland Kitchen Prothrombin Time 07/30/2013 13.1  11.6 - 15.2 seconds Final  . INR 07/30/2013 1.01  0.00 - 1.49 Final  . ABO/RH(D) 07/30/2013 A POS   Final  . Antibody Screen 07/30/2013 NEG   Final  . Sample Expiration 07/30/2013 08/07/2013   Final  . Color, Urine 07/30/2013 AMBER* YELLOW Final   BIOCHEMICALS MAY BE AFFECTED BY COLOR  . APPearance 07/30/2013 CLOUDY* CLEAR Final  . Specific Gravity, Urine 07/30/2013 1.028  1.005 - 1.030 Final  . pH  07/30/2013 5.5  5.0 - 8.0 Final  . Glucose, UA 07/30/2013 NEGATIVE  NEGATIVE mg/dL Final  . Hgb urine dipstick 07/30/2013 NEGATIVE  NEGATIVE Final  . Bilirubin Urine 07/30/2013 SMALL* NEGATIVE Final  . Ketones, ur 07/30/2013 NEGATIVE  NEGATIVE mg/dL Final  . Protein, ur 07/30/2013 NEGATIVE  NEGATIVE mg/dL Final  . Urobilinogen, UA 07/30/2013 0.2  0.0 - 1.0 mg/dL Final  . Nitrite 07/30/2013 NEGATIVE  NEGATIVE Final  . Leukocytes, UA 07/30/2013 SMALL* NEGATIVE Final  . Squamous Epithelial / LPF 07/30/2013 FEW* RARE Final  . WBC, UA 07/30/2013 7-10  <3 WBC/hpf Final  . RBC / HPF 07/30/2013 0-2  <3 RBC/hpf Final  . Bacteria, UA 07/30/2013 RARE  RARE Final  . Edwina Barth 07/30/2013 MUCOUS PRESENT   Final     X-Rays:No results found.  EKG: Orders placed during the hospital encounter of 03/01/11  . EKG     Hospital Course: [name] is a 80 y.o. who was admitted to [facility]. They were brought to the operating room on 08/04/2013 and underwent Procedure(s): RIGHT TOTAL KNEE ARTHROPLASTY.  Patient tolerated the procedure well and was later transferred to the recovery room and then to the orthopaedic floor for postoperative care.  They were given PO and IV analgesics for pain control following their surgery.  They were given 24 hours of postoperative antibiotics of  Anti-infectives   Start     Dose/Rate Route Frequency Ordered Stop   08/04/13 2000  ceFAZolin (ANCEF) IVPB 2 g/50 mL premix     2 g 100 mL/hr over 30 Minutes Intravenous Every 6 hours 08/04/13 1554 08/05/13 0231   08/04/13 1000  ceFAZolin (ANCEF) IVPB 2 g/50 mL premix     2 g 100 mL/hr over 30 Minutes Intravenous On call to O.R. 08/04/13 1443 08/04/13 1253     and started on DVT prophylaxis in the form of Lovenox.   PT and OT were ordered for total joint protocol.  Discharge planning consulted to help with postop disposition and equipment needs.  The patient wanted to look into SNF following the hospital stay.   Patient had a decent night on the evening of surgery.  They started to get up OOB with therapy on day one and walked 80 feet. Hemovac drain was pulled without difficulty.  Continued to work with therapy into day two.  Dressing was changed  on day two and the incision was healing well with only slight bruising.  By day three, the patient had progressed with therapy and meeting their goals.  Incision was healing well.  Patient was seen in rounds and was ready to go to [facility].   Discharge Medications: Prior to Admission medications   Medication Sig Start Date End Date Taking? Authorizing Provider  diazepam (VALIUM) 5 MG tablet Take 5 mg by mouth 2 (two) times daily.   Yes Historical Provider, MD  esomeprazole (NEXIUM) 40 MG capsule Take 40 mg by mouth daily at 12 noon.   Yes Historical Provider, MD  naproxen sodium (ANAPROX) 220 MG tablet Take 440 mg by mouth daily.   Yes Historical Provider, MD  primidone (MYSOLINE) 50 MG tablet Take 50 mg by mouth 2 (two) times daily.    Yes Historical Provider, MD  acetaminophen (TYLENOL) 325 MG tablet Take 2 tablets (650 mg total) by mouth every 6 (six) hours as needed for mild pain (or Fever >/= 101). 08/07/13   Kindra Bickham, PA-C  bisacodyl (DULCOLAX) 10 MG suppository Place 1 suppository (10 mg total) rectally daily as needed for moderate constipation. 08/07/13   Brycelynn Stampley Dara Lords, PA-C  CALCIUM PO Take 1 tablet by mouth daily.    Historical Provider, MD  docusate sodium 100 MG CAPS Take 100 mg by mouth 2 (two) times daily. 08/07/13   Amit Meloy, PA-C  enoxaparin (LOVENOX) 40 MG/0.4ML injection Inject 0.4 mLs (40 mg total) into the skin daily. Take Lovenox injections daily for seven more days. Once completed the Lovenox, start taking a 325 mg Aspirin daily for four weeks. 08/07/13   Jacobe Study Dara Lords, PA-C  glucosamine-chondroitin 500-400 MG tablet Take 1 tablet by mouth daily.    Historical Provider, MD  HYDROcodone-acetaminophen  (NORCO/VICODIN) 5-325 MG per tablet Take 1-2 tablets by mouth every 4 (four) hours as needed for moderate pain. 08/07/13   Claxton Levitz Dara Lords, PA-C  hyoscyamine (LEVSIN) 0.125 MG tablet Take 0.125 mg by mouth every 4 (four) hours as needed for cramping.     Historical Provider, MD  methocarbamol (ROBAXIN) 500 MG tablet Take 1 tablet (500 mg total) by mouth every 6 (six) hours as needed for muscle spasms. 08/07/13   Fredrica Capano Dara Lords, PA-C  metoCLOPramide (REGLAN) 5 MG tablet Take 1-2 tablets (5-10 mg total) by mouth every 8 (eight) hours as needed for nausea (if ondansetron (ZOFRAN) ineffective.). 08/07/13   Kaheem Halleck Dara Lords, PA-C  Multiple Vitamins-Minerals (MULTIVITAMIN WITH MINERALS) tablet Take 1 tablet by mouth daily.    Historical Provider, MD  ondansetron (ZOFRAN) 4 MG tablet Take 1 tablet (4 mg total) by mouth every 6 (six) hours as needed for nausea. 08/07/13   Favio Moder, PA-C  polyethylene glycol (MIRALAX / GLYCOLAX) packet Take 17 g by mouth daily as needed for mild constipation. 08/07/13   Kerstin Crusoe, PA-C  traMADol (ULTRAM) 50 MG tablet Take 1-2 tablets (50-100 mg total) by mouth every 6 (six) hours as needed for moderate pain. 08/07/13   Jahari Wiginton Dara Lords, PA-C   Discharge to SNF  Diet - Regular diet  Follow up - in 2 weeks  Activity - WBAT  Disposition - [facility]  Condition Upon Discharge - Good  D/C Meds - See DC Summary  DVT Prophylaxis - Lovenox   Discharge Orders   Future Orders Complete By Expires   Call MD / Call 911  As directed    Comments:     If you experience chest pain  or shortness of breath, CALL 911 and be transported to the hospital emergency room.  If you develope a fever above 101 F, pus (white drainage) or increased drainage or redness at the wound, or calf pain, call your surgeon's office.   Change dressing  As directed    Comments:     Change dressing daily with sterile 4 x 4 inch gauze dressing and apply  TED hose. Do not submerge the incision under water.   Diet Carb Modified  As directed    Diet general  As directed    Discharge instructions  As directed    Comments:     Pick up stool softner and laxative for home. Do not submerge incision under water. May shower. Continue to use ice for pain and swelling from surgery.  Take Lovenox injections daily for seven more days. Once completed the Lovenox, start taking a 325 mg Aspirin daily for four weeks.  When discharged from the skilled rehab facility, please have the facility set up the patient's [place] prior to being released.  Also provide the patient with their medications at time of release from the facility to include their pain medication, the muscle relaxants, and their blood thinner medication.  If the patient is still at the rehab facility at time of follow up appointment, please also assist the patient in arranging follow up appointment in our office and any transportation needs.   Do not put a pillow under the knee. Place it under the heel.  As directed    Do not sit on low chairs, stoools or toilet seats, as it may be difficult to get up from low surfaces  As directed    Driving restrictions  As directed    Comments:     No driving until released by the physician.   Increase activity slowly as tolerated  As directed    Lifting restrictions  As directed    Comments:     No lifting until released by the physician.   Patient may shower  As directed    Comments:     You may shower without a dressing once there is no drainage.  Do not wash over the wound.  If drainage remains, do not shower until drainage stops.   TED hose  As directed    Comments:     Use stockings (TED hose) for 3 weeks on both leg(s).  You may remove them at night for sleeping.   Weight bearing as tolerated  As directed    Questions:     Laterality:     Extremity:         Medication List    STOP taking these medications        CALCIUM PO     glucosamine-chondroitin 500-400 MG tablet     multivitamin with minerals tablet     naproxen sodium 220 MG tablet  Commonly known as:  ANAPROX      TAKE these medications       acetaminophen 325 MG tablet  Commonly known as:  TYLENOL  Take 2 tablets (650 mg total) by mouth every 6 (six) hours as needed for mild pain (or Fever >/= 101).     bisacodyl 10 MG suppository  Commonly known as:  DULCOLAX  Place 1 suppository (10 mg total) rectally daily as needed for moderate constipation.     diazepam 5 MG tablet  Commonly known as:  VALIUM  Take 5 mg by mouth 2 (two)  times daily.     DSS 100 MG Caps  Take 100 mg by mouth 2 (two) times daily.     enoxaparin 40 MG/0.4ML injection  Commonly known as:  LOVENOX  - Inject 0.4 mLs (40 mg total) into the skin daily. Take Lovenox injections daily for seven more days.  - Once completed the Lovenox, start taking a 325 mg Aspirin daily for four weeks.     esomeprazole 40 MG capsule  Commonly known as:  NEXIUM  Take 40 mg by mouth daily at 12 noon.     HYDROcodone-acetaminophen 5-325 MG per tablet  Commonly known as:  NORCO/VICODIN  Take 1-2 tablets by mouth every 4 (four) hours as needed for moderate pain.     LEVSIN 0.125 MG tablet  Generic drug:  hyoscyamine  Take 0.125 mg by mouth every 4 (four) hours as needed for cramping.     methocarbamol 500 MG tablet  Commonly known as:  ROBAXIN  Take 1 tablet (500 mg total) by mouth every 6 (six) hours as needed for muscle spasms.     metoCLOPramide 5 MG tablet  Commonly known as:  REGLAN  Take 1-2 tablets (5-10 mg total) by mouth every 8 (eight) hours as needed for nausea (if ondansetron (ZOFRAN) ineffective.).     ondansetron 4 MG tablet  Commonly known as:  ZOFRAN  Take 1 tablet (4 mg total) by mouth every 6 (six) hours as needed for nausea.     polyethylene glycol packet  Commonly known as:  MIRALAX / GLYCOLAX  Take 17 g by mouth daily as needed for mild  constipation.     primidone 50 MG tablet  Commonly known as:  MYSOLINE  Take 50 mg by mouth 2 (two) times daily.     traMADol 50 MG tablet  Commonly known as:  ULTRAM  Take 1-2 tablets (50-100 mg total) by mouth every 6 (six) hours as needed for moderate pain.           Follow-up Information   Follow up with Gearlean Alf, MD. Schedule an appointment as soon as possible for a visit in 2 weeks.   Specialty:  Orthopedic Surgery   Contact information:   [address] [place] [address] [phone]       Signed: Mickel Crow 08/07/2013, 8:11 AM

## 2013-08-07 NOTE — Progress Notes (Signed)
Physical Therapy Treatment Patient Details Name: [name] MRN: [id number] DOB: 1934-04-06 Today's Date: 08/07/2013 Time: 0539-7673 PT Time Calculation (min): 31 min  PT Assessment / Plan / Recommendation  History of Present Illness s/p R TKR   PT Comments   POD # 3 am session.  Assisted pt to BR then amb in hallway.  Assisted back to bed to perform TKR TE's. Applied ICE.   Follow Up Recommendations  SNF     Does the patient have the potential to tolerate intense rehabilitation     Barriers to Discharge        Equipment Recommendations       Recommendations for Other Services    Frequency 7X/week   Progress towards PT Goals    Plan      Precautions / Restrictions Precautions Precautions: Knee Required Braces or Orthoses: Knee Immobilizer - Right Knee Immobilizer - Right: Discontinue once straight leg raise with < 10 degree lag Restrictions Weight Bearing Restrictions: No Other Position/Activity Restrictions: WBAT    Pertinent Vitals/Pain C/o 5/10 pain after TE's ICE applied    Mobility  Bed Mobility Bed Mobility: Sit to Supine Supine to sit: Min assist;HOB elevated General bed mobility comments: verbal cues for self assist of R LE Transfers Overall transfer level: Needs assistance Equipment used: Rolling walker (2 wheeled) Transfers: Sit to/from Stand Sit to Stand: Min guard;Min assist General transfer comment: verbal cues for technique; min assist to control descent onto bed  Ambulation/Gait Ambulation/Gait assistance: Min guard Ambulation Distance (Feet): 115 Feet Assistive device: Rolling walker (2 wheeled) Gait Pattern/deviations: Step-to pattern Gait velocity: decr General Gait Details: verbal cues for safe technique, RW distance, WBing through UE to assist with pain control    Exercises   Total Knee Replacement TE's 10 reps B LE ankle pumps 10 reps towel squeezes 10 reps knee presses 10 reps heel slides  10 reps SAQ's 10 reps SLR's 10  reps ABD Followed by ICE    PT Goals (current goals can now be found in the care plan section)    Visit Information  Last PT Received On: 08/07/13 Assistance Needed: +1 History of Present Illness: s/p R TKR    Subjective Data      Cognition       Balance     End of Session PT - End of Session Equipment Utilized During Treatment: Right knee immobilizer;Gait belt Activity Tolerance: Patient limited by fatigue Patient left: in bed;with call bell/phone within reach   Rica Koyanagi  PTA [facility]  Acute  Rehab Pager      [phone]

## 2013-08-07 NOTE — Progress Notes (Signed)
   Subjective: 3 Days Post-Op Procedure(s) (LRB): RIGHT TOTAL KNEE ARTHROPLASTY (Right) Patient reports pain as mild.   Patient seen in rounds by Dr. Wynelle Link. Patient is well, but has had some minor complaints of pain in the knee, requiring pain medications Patient is ready to go to [facility] today.  Objective: Vital signs in last 24 hours: Temp:  [98.3 F (36.8 C)-98.7 F (37.1 C)] 98.7 F (37.1 C) (01/22 0447) Pulse Rate:  [66-81] 81 (01/22 0447) Resp:  [15-16] 16 (01/22 0447) BP: (119-151)/(65-75) 119/75 mmHg (01/22 0447) SpO2:  [94 %-98 %] 94 % (01/22 0447)  Intake/Output from previous day:  Intake/Output Summary (Last 24 hours) at 08/07/13 0751 Last data filed at 08/07/13 0448  Gross per 24 hour  Intake   1200 ml  Output   1700 ml  Net   -500 ml    Intake/Output this shift:    Labs:  Recent Labs  08/05/13 0423 08/06/13 0410 08/07/13 0437  HGB 12.1 10.4* 11.0*    Recent Labs  08/06/13 0410 08/07/13 0437  WBC 7.2 5.8  RBC 3.11* 3.34*  HCT 30.6* 32.9*  PLT 137* 136*    Recent Labs  08/05/13 0423 08/06/13 0410  NA 139 140  K 5.2 4.5  CL 104 103  CO2 26 30  BUN 12 16  CREATININE 0.74 0.92  GLUCOSE 130* 122*  CALCIUM 8.5 8.8   No results found for this basename: LABPT, INR,  in the last 72 hours  EXAM: General - Patient is Alert, Appropriate and Oriented Extremity - Neurovascular intact Sensation intact distally Dorsiflexion/Plantar flexion intact Incision - clean, dry, no drainage, healing Motor Function - intact, moving foot and toes well on exam.   Assessment/Plan: 3 Days Post-Op Procedure(s) (LRB): RIGHT TOTAL KNEE ARTHROPLASTY (Right) Procedure(s) (LRB): RIGHT TOTAL KNEE ARTHROPLASTY (Right) Past Medical History  Diagnosis Date  . Hx of colonic polyps   . Fracture of humerus, proximal, left, closed October 28, 2011    pt has finished physical therapy-limited ROM reaching to her back and unable to lift heavy things with left  hand/arm  . Essential tremor     of head, sometimes hands.  pt takes primidone to help the tremors-DR. MILLER-NEUROLOGIST IN [place].  PT WAS STARTED ON VALUIM MANY YEARS AGO FOR HER TREMORS--AND CONTINUES TO TAKE.  . Osteoarthritis     PAIN AND OA BILATERAL KNEES; ALSO HAS ARTHRITIS IN HANDS AND BACK BUT NO BACK PAIN  . GERD (gastroesophageal reflux disease)   . H/O hiatal hernia    Active Problems:   OA (osteoarthritis) of knee   Postoperative anemia due to acute blood loss  Estimated body mass index is 29.51 kg/(m^2) as calculated from the following:   Height as of this encounter: 5\' 4"  (1.626 m).   Weight as of this encounter: 78.019 kg (172 lb). Up with therapy Discharge to SNF Diet - Regular diet Follow up - in 2 weeks Activity - WBAT Disposition - A[facility] Condition Upon Discharge - Good D/C Meds - See DC Summary DVT Prophylaxis - Lovenox  PERKINS, ALEXZANDREW 08/07/2013, 7:51 AM

## 2013-08-07 NOTE — Discharge Instructions (Signed)

## 2013-08-08 NOTE — Progress Notes (Signed)
Clinical Social Work Department CLINICAL SOCIAL WORK PLACEMENT NOTE 08/08/2013  Patient:  [name], [name]  Account Number:  [id number] Admit date:  08/04/2013  Clinical Social Worker:  Werner Lean, LCSW  Date/time:  08/05/2013 02:47 PM  Clinical Social Work is seeking post-discharge placement for this patient at the following level of care:   SKILLED NURSING   (*CSW will update this form in Epic as items are completed)     Patient/family provided with [place] Department of Clinical Social Work's list of facilities offering this level of care within the geographic area requested by the patient (or if unable, by the patient's family).  08/05/2013  Patient/family informed of their freedom to choose among providers that offer the needed level of care, that participate in Medicare, Medicaid or managed care program needed by the patient, have an available bed and are willing to accept the patient.    Patient/family informed of [facility]' ownership interest in [facility], as well as of the fact that they are under no obligation to receive care at this facility.  PASARR submitted to E[employer] on  PASARR number received from [employer] on 03/14/2012  FL2 transmitted to all facilities in geographic area requested by pt/family on  08/05/2013 FL2 transmitted to all facilities within larger geographic area on   Patient informed that his/her managed care company has contracts with or will negotiate with  certain facilities, including the following:     Patient/family informed of bed offers received:  08/05/2013 Patient chooses bed at [facility]ician recommends and patient chooses bed at    Patient to be transferred to [facility] on  08/07/2013 Patient to be transferred to facility by [facility]  The following physician request were entered in Epic:   Additional Comments:  Werner Lean LCSW [phone]

## 2013-08-19 ENCOUNTER — Non-Acute Institutional Stay (SKILLED_NURSING_FACILITY): Payer: Medicare Other | Admitting: Adult Health

## 2013-08-19 DIAGNOSIS — M1710 Unilateral primary osteoarthritis, unspecified knee: Secondary | ICD-10-CM

## 2013-08-19 DIAGNOSIS — M179 Osteoarthritis of knee, unspecified: Secondary | ICD-10-CM

## 2013-08-19 DIAGNOSIS — F411 Generalized anxiety disorder: Secondary | ICD-10-CM

## 2013-08-19 DIAGNOSIS — K219 Gastro-esophageal reflux disease without esophagitis: Secondary | ICD-10-CM

## 2013-08-19 DIAGNOSIS — G250 Essential tremor: Secondary | ICD-10-CM

## 2013-08-19 DIAGNOSIS — G252 Other specified forms of tremor: Secondary | ICD-10-CM

## 2013-08-19 DIAGNOSIS — IMO0002 Reserved for concepts with insufficient information to code with codable children: Secondary | ICD-10-CM

## 2013-10-29 ENCOUNTER — Encounter: Payer: Self-pay | Admitting: Adult Health

## 2013-10-29 DIAGNOSIS — K219 Gastro-esophageal reflux disease without esophagitis: Secondary | ICD-10-CM | POA: Insufficient documentation

## 2013-10-29 NOTE — Progress Notes (Signed)
Patient ID: [name], female   DOB: Apr 19, 1934, 80 y.o.   MRN: [id number]              PROGRESS NOTE  DATE: 08/19/2013   FACILITY: [facility]  LEVEL OF CARE: SNF (31)  Acute Visit  CHIEF COMPLAINT:  Discharge Notes  HISTORY OF PRESENT ILLNESS: This is a 80 year old female who is for discharge home with Home health PT, OT and Nursing. She has been admitted to [facility] on 08/07/13 from [facility] with Osteoarthritis S/P Right total knee arthroplasty. Patient was admitted to this facility for short-term rehabilitation after the patient's recent hospitalization.  Patient has completed SNF rehabilitation and therapy has cleared the patient for discharge.  Reassessment of ongoing problem(s):  GERD: pt's GERD is stable.  Denies ongoing heartburn, abd. Pain, nausea or vomiting.  Currently on a PPI & tolerates it without any adverse reactions.  ANXIETY: The anxiety remains stable. Patient denies ongoing anxiety or irritability. No complications reported from the medications currently being used.   PAST MEDICAL HISTORY : Reviewed.  No changes.  CURRENT MEDICATIONS: Reviewed per [facility]  REVIEW OF SYSTEMS:  GENERAL: no change in appetite, no fatigue, no weight changes, no fever, chills or weakness RESPIRATORY: no cough, SOB, DOE, wheezing, hemoptysis CARDIAC: no chest pain, edema or palpitations GI: no abdominal pain, diarrhea, constipation, heart burn, nausea or vomiting  PHYSICAL EXAMINATION  GENERAL: no acute distress, normal body habitus EYES: conjunctivae normal, sclerae normal, normal eye lids NECK: supple, trachea midline, no neck masses, no thyroid tenderness, no thyromegaly LYMPHATICS: no LAN in the neck, no supraclavicular LAN RESPIRATORY: breathing is even & unlabored, BS CTAB CARDIAC: RRR, no murmur,no extra heart sounds, no edema GI: abdomen soft, normal BS, no masses, no tenderness, no hepatomegaly, no splenomegaly EXTREMITIES: able to  ambulate with rolling walker PSYCHIATRIC: the patient is alert & oriented to person, affect & behavior appropriate  LABS/RADIOLOGY: Labs reviewed: Basic Metabolic Panel:  Recent Labs  07/30/13 1205 08/05/13 0423 08/06/13 0410  NA 139 139 140  K 4.5 5.2 4.5  CL 101 104 103  CO2 25 26 30   GLUCOSE 102* 130* 122*  BUN 19 12 16   CREATININE 0.84 0.74 0.92  CALCIUM 9.3 8.5 8.8   Liver Function Tests:  Recent Labs  07/30/13 1205  AST 51*  ALT 63*  ALKPHOS 96  BILITOT 0.7  PROT 7.7  ALBUMIN 4.2   CBC:  Recent Labs  08/05/13 0423 08/06/13 0410 08/07/13 0437  WBC 8.5 7.2 5.8  HGB 12.1 10.4* 11.0*  HCT 34.7* 30.6* 32.9*  MCV 96.9 98.4 98.5  PLT 149* 137* 136*    ASSESSMENT/PLAN:  Osteoarthritis status post right total knee arthroplasty - for home health PT, OT and Nursing GERD - stable, continue Prilosec Anxiety - continue Valium Essential tremors - continue Mysoline   I have filled out patient's discharge paperwork and written prescriptions.  Patient will receive home health PT, OT and Nursing   Total discharge time: Less than 30 minutes Discharge time involved coordination of the discharge process with social worker, nursing staff and therapy department. Medical justification for home health services verified.  CPT CODE: 73532  Seth Bake - NP [facility] [phone]

## 2014-03-27 DIAGNOSIS — G609 Hereditary and idiopathic neuropathy, unspecified: Secondary | ICD-10-CM

## 2014-03-27 DIAGNOSIS — G250 Essential tremor: Secondary | ICD-10-CM | POA: Insufficient documentation

## 2014-03-27 DIAGNOSIS — R269 Unspecified abnormalities of gait and mobility: Secondary | ICD-10-CM | POA: Insufficient documentation

## 2014-03-27 HISTORY — DX: Hereditary and idiopathic neuropathy, unspecified: G60.9

## 2014-03-30 DIAGNOSIS — R5383 Other fatigue: Secondary | ICD-10-CM | POA: Insufficient documentation

## 2014-12-10 DIAGNOSIS — H2510 Age-related nuclear cataract, unspecified eye: Secondary | ICD-10-CM | POA: Insufficient documentation

## 2015-03-18 LAB — BASIC METABOLIC PANEL
BUN: 15 (ref 4–21)
CREATININE: 0.9 (ref 0.5–1.1)
GLUCOSE: 97
POTASSIUM: 4.5 (ref 3.4–5.3)
Sodium: 139 (ref 137–147)

## 2015-04-06 ENCOUNTER — Other Ambulatory Visit: Payer: Self-pay | Admitting: Family

## 2015-04-06 DIAGNOSIS — E2839 Other primary ovarian failure: Secondary | ICD-10-CM

## 2015-04-13 ENCOUNTER — Ambulatory Visit
Admission: RE | Admit: 2015-04-13 | Discharge: 2015-04-13 | Disposition: A | Discharge status: Home / Self Care | Payer: Medicare Other | Source: Ambulatory Visit | Attending: Family | Admitting: Family

## 2015-04-13 DIAGNOSIS — E2839 Other primary ovarian failure: Secondary | ICD-10-CM

## 2015-08-10 DIAGNOSIS — K1329 Other disturbances of oral epithelium, including tongue: Secondary | ICD-10-CM | POA: Diagnosis not present

## 2015-09-20 DIAGNOSIS — H16223 Keratoconjunctivitis sicca, not specified as Sjogren's, bilateral: Secondary | ICD-10-CM | POA: Diagnosis not present

## 2015-09-20 DIAGNOSIS — D2312 Other benign neoplasm of skin of left eyelid, including canthus: Secondary | ICD-10-CM | POA: Diagnosis not present

## 2015-09-20 DIAGNOSIS — H16213 Exposure keratoconjunctivitis, bilateral: Secondary | ICD-10-CM | POA: Diagnosis not present

## 2015-09-21 DIAGNOSIS — D2312 Other benign neoplasm of skin of left eyelid, including canthus: Secondary | ICD-10-CM | POA: Diagnosis not present

## 2015-09-30 DIAGNOSIS — Z96653 Presence of artificial knee joint, bilateral: Secondary | ICD-10-CM | POA: Diagnosis not present

## 2015-09-30 DIAGNOSIS — Z96651 Presence of right artificial knee joint: Secondary | ICD-10-CM | POA: Diagnosis not present

## 2015-09-30 DIAGNOSIS — Z471 Aftercare following joint replacement surgery: Secondary | ICD-10-CM | POA: Diagnosis not present

## 2015-09-30 DIAGNOSIS — Z96652 Presence of left artificial knee joint: Secondary | ICD-10-CM | POA: Diagnosis not present

## 2015-10-14 DIAGNOSIS — R7989 Other specified abnormal findings of blood chemistry: Secondary | ICD-10-CM | POA: Diagnosis not present

## 2015-10-14 DIAGNOSIS — F418 Other specified anxiety disorders: Secondary | ICD-10-CM | POA: Diagnosis not present

## 2015-10-14 DIAGNOSIS — R634 Abnormal weight loss: Secondary | ICD-10-CM | POA: Diagnosis not present

## 2015-10-14 DIAGNOSIS — J069 Acute upper respiratory infection, unspecified: Secondary | ICD-10-CM | POA: Diagnosis not present

## 2015-10-14 LAB — HEPATIC FUNCTION PANEL
ALT: 52 — AB (ref 7–35)
AST: 52 — AB (ref 13–35)
Alkaline Phosphatase: 76 (ref 25–125)
BILIRUBIN, TOTAL: 0.4

## 2015-10-14 LAB — CBC AND DIFFERENTIAL
HCT: 46 (ref 36–46)
Hemoglobin: 15.1 (ref 12.0–16.0)
WBC: 4.2

## 2015-10-14 LAB — BASIC METABOLIC PANEL
BUN: 12 (ref 4–21)
CREATININE: 0.9 (ref 0.5–1.1)
Glucose: 86
POTASSIUM: 4.7 (ref 3.4–5.3)
Sodium: 138 (ref 137–147)

## 2015-10-14 LAB — TSH: TSH: 1.39 (ref 0.41–5.90)

## 2015-11-04 DIAGNOSIS — F418 Other specified anxiety disorders: Secondary | ICD-10-CM | POA: Diagnosis not present

## 2015-11-04 DIAGNOSIS — R634 Abnormal weight loss: Secondary | ICD-10-CM | POA: Diagnosis not present

## 2015-11-04 DIAGNOSIS — G250 Essential tremor: Secondary | ICD-10-CM | POA: Diagnosis not present

## 2015-11-25 DIAGNOSIS — R251 Tremor, unspecified: Secondary | ICD-10-CM | POA: Diagnosis not present

## 2015-11-25 DIAGNOSIS — G609 Hereditary and idiopathic neuropathy, unspecified: Secondary | ICD-10-CM | POA: Diagnosis not present

## 2015-11-25 DIAGNOSIS — R269 Unspecified abnormalities of gait and mobility: Secondary | ICD-10-CM | POA: Diagnosis not present

## 2015-11-25 DIAGNOSIS — Z9181 History of falling: Secondary | ICD-10-CM | POA: Diagnosis not present

## 2015-11-25 DIAGNOSIS — G4733 Obstructive sleep apnea (adult) (pediatric): Secondary | ICD-10-CM | POA: Diagnosis not present

## 2015-12-22 DIAGNOSIS — H52203 Unspecified astigmatism, bilateral: Secondary | ICD-10-CM | POA: Diagnosis not present

## 2015-12-22 DIAGNOSIS — H16213 Exposure keratoconjunctivitis, bilateral: Secondary | ICD-10-CM | POA: Diagnosis not present

## 2015-12-22 DIAGNOSIS — H16223 Keratoconjunctivitis sicca, not specified as Sjogren's, bilateral: Secondary | ICD-10-CM | POA: Diagnosis not present

## 2015-12-22 DIAGNOSIS — H02831 Dermatochalasis of right upper eyelid: Secondary | ICD-10-CM | POA: Diagnosis not present

## 2015-12-22 DIAGNOSIS — Z961 Presence of intraocular lens: Secondary | ICD-10-CM | POA: Diagnosis not present

## 2016-01-04 DIAGNOSIS — H16223 Keratoconjunctivitis sicca, not specified as Sjogren's, bilateral: Secondary | ICD-10-CM | POA: Diagnosis not present

## 2016-01-04 DIAGNOSIS — H16213 Exposure keratoconjunctivitis, bilateral: Secondary | ICD-10-CM | POA: Diagnosis not present

## 2016-01-24 DIAGNOSIS — H16213 Exposure keratoconjunctivitis, bilateral: Secondary | ICD-10-CM | POA: Diagnosis not present

## 2016-01-24 DIAGNOSIS — H16223 Keratoconjunctivitis sicca, not specified as Sjogren's, bilateral: Secondary | ICD-10-CM | POA: Diagnosis not present

## 2016-01-31 DIAGNOSIS — H01001 Unspecified blepharitis right upper eyelid: Secondary | ICD-10-CM | POA: Diagnosis not present

## 2016-01-31 DIAGNOSIS — R7989 Other specified abnormal findings of blood chemistry: Secondary | ICD-10-CM | POA: Diagnosis not present

## 2016-01-31 DIAGNOSIS — K219 Gastro-esophageal reflux disease without esophagitis: Secondary | ICD-10-CM | POA: Diagnosis not present

## 2016-01-31 DIAGNOSIS — H16223 Keratoconjunctivitis sicca, not specified as Sjogren's, bilateral: Secondary | ICD-10-CM | POA: Diagnosis not present

## 2016-01-31 DIAGNOSIS — L659 Nonscarring hair loss, unspecified: Secondary | ICD-10-CM | POA: Diagnosis not present

## 2016-01-31 DIAGNOSIS — H16213 Exposure keratoconjunctivitis, bilateral: Secondary | ICD-10-CM | POA: Diagnosis not present

## 2016-01-31 DIAGNOSIS — R220 Localized swelling, mass and lump, head: Secondary | ICD-10-CM | POA: Diagnosis not present

## 2016-01-31 LAB — CBC AND DIFFERENTIAL
HEMATOCRIT: 42 (ref 36–46)
HEMOGLOBIN: 14.1 (ref 12.0–16.0)
Platelets: 179 (ref 150–399)
WBC: 5.5

## 2016-01-31 LAB — HEPATIC FUNCTION PANEL
ALK PHOS: 77 (ref 25–125)
ALT: 23 (ref 7–35)
AST: 30 (ref 13–35)
BILIRUBIN, TOTAL: 0.6

## 2016-01-31 LAB — BASIC METABOLIC PANEL
BUN: 16 (ref 4–21)
Creatinine: 0.9 (ref 0.5–1.1)
Glucose: 100
Potassium: 5 (ref 3.4–5.3)
SODIUM: 140 (ref 137–147)

## 2016-01-31 LAB — TSH: TSH: 1.47 (ref 0.41–5.90)

## 2016-02-17 DIAGNOSIS — R7989 Other specified abnormal findings of blood chemistry: Secondary | ICD-10-CM | POA: Diagnosis not present

## 2016-02-17 DIAGNOSIS — L659 Nonscarring hair loss, unspecified: Secondary | ICD-10-CM | POA: Diagnosis not present

## 2016-03-14 DIAGNOSIS — R251 Tremor, unspecified: Secondary | ICD-10-CM | POA: Diagnosis not present

## 2016-03-14 DIAGNOSIS — M1990 Unspecified osteoarthritis, unspecified site: Secondary | ICD-10-CM | POA: Diagnosis not present

## 2016-03-14 DIAGNOSIS — Z79899 Other long term (current) drug therapy: Secondary | ICD-10-CM | POA: Diagnosis not present

## 2016-03-14 DIAGNOSIS — G4730 Sleep apnea, unspecified: Secondary | ICD-10-CM | POA: Diagnosis not present

## 2016-03-14 DIAGNOSIS — G609 Hereditary and idiopathic neuropathy, unspecified: Secondary | ICD-10-CM | POA: Diagnosis not present

## 2016-05-02 DIAGNOSIS — G250 Essential tremor: Secondary | ICD-10-CM | POA: Diagnosis not present

## 2016-05-02 DIAGNOSIS — Z0000 Encounter for general adult medical examination without abnormal findings: Secondary | ICD-10-CM | POA: Diagnosis not present

## 2016-05-08 DIAGNOSIS — H16213 Exposure keratoconjunctivitis, bilateral: Secondary | ICD-10-CM | POA: Diagnosis not present

## 2016-05-08 DIAGNOSIS — H16223 Keratoconjunctivitis sicca, not specified as Sjogren's, bilateral: Secondary | ICD-10-CM | POA: Diagnosis not present

## 2016-05-08 DIAGNOSIS — H01001 Unspecified blepharitis right upper eyelid: Secondary | ICD-10-CM | POA: Diagnosis not present

## 2016-05-30 DIAGNOSIS — R197 Diarrhea, unspecified: Secondary | ICD-10-CM | POA: Diagnosis not present

## 2016-06-01 DIAGNOSIS — R1030 Lower abdominal pain, unspecified: Secondary | ICD-10-CM | POA: Diagnosis not present

## 2016-06-01 DIAGNOSIS — K5730 Diverticulosis of large intestine without perforation or abscess without bleeding: Secondary | ICD-10-CM | POA: Diagnosis not present

## 2016-09-08 DIAGNOSIS — G4733 Obstructive sleep apnea (adult) (pediatric): Secondary | ICD-10-CM | POA: Diagnosis not present

## 2016-09-08 DIAGNOSIS — Z9181 History of falling: Secondary | ICD-10-CM | POA: Diagnosis not present

## 2016-09-08 DIAGNOSIS — R269 Unspecified abnormalities of gait and mobility: Secondary | ICD-10-CM | POA: Diagnosis not present

## 2016-09-08 DIAGNOSIS — G479 Sleep disorder, unspecified: Secondary | ICD-10-CM | POA: Diagnosis not present

## 2016-09-08 DIAGNOSIS — R251 Tremor, unspecified: Secondary | ICD-10-CM | POA: Diagnosis not present

## 2016-10-31 DIAGNOSIS — D1721 Benign lipomatous neoplasm of skin and subcutaneous tissue of right arm: Secondary | ICD-10-CM | POA: Diagnosis not present

## 2016-10-31 DIAGNOSIS — E2839 Other primary ovarian failure: Secondary | ICD-10-CM | POA: Diagnosis not present

## 2016-10-31 DIAGNOSIS — G250 Essential tremor: Secondary | ICD-10-CM | POA: Diagnosis not present

## 2016-10-31 DIAGNOSIS — M8580 Other specified disorders of bone density and structure, unspecified site: Secondary | ICD-10-CM | POA: Diagnosis not present

## 2016-10-31 DIAGNOSIS — J301 Allergic rhinitis due to pollen: Secondary | ICD-10-CM | POA: Diagnosis not present

## 2016-11-03 ENCOUNTER — Other Ambulatory Visit: Payer: Self-pay | Admitting: Family

## 2016-11-03 DIAGNOSIS — E2839 Other primary ovarian failure: Secondary | ICD-10-CM

## 2016-11-07 DIAGNOSIS — H524 Presbyopia: Secondary | ICD-10-CM | POA: Diagnosis not present

## 2016-11-07 DIAGNOSIS — H01004 Unspecified blepharitis left upper eyelid: Secondary | ICD-10-CM | POA: Diagnosis not present

## 2016-11-07 DIAGNOSIS — H01001 Unspecified blepharitis right upper eyelid: Secondary | ICD-10-CM | POA: Diagnosis not present

## 2016-11-07 DIAGNOSIS — H16223 Keratoconjunctivitis sicca, not specified as Sjogren's, bilateral: Secondary | ICD-10-CM | POA: Diagnosis not present

## 2016-11-07 DIAGNOSIS — H01005 Unspecified blepharitis left lower eyelid: Secondary | ICD-10-CM | POA: Diagnosis not present

## 2016-11-07 DIAGNOSIS — H52203 Unspecified astigmatism, bilateral: Secondary | ICD-10-CM | POA: Diagnosis not present

## 2016-11-07 DIAGNOSIS — H16213 Exposure keratoconjunctivitis, bilateral: Secondary | ICD-10-CM | POA: Diagnosis not present

## 2016-11-07 DIAGNOSIS — H01002 Unspecified blepharitis right lower eyelid: Secondary | ICD-10-CM | POA: Diagnosis not present

## 2017-01-03 DIAGNOSIS — H02051 Trichiasis without entropian right upper eyelid: Secondary | ICD-10-CM | POA: Diagnosis not present

## 2017-04-04 ENCOUNTER — Encounter: Payer: Self-pay | Admitting: Gastroenterology

## 2017-04-13 ENCOUNTER — Ambulatory Visit
Admission: RE | Admit: 2017-04-13 | Discharge: 2017-04-13 | Disposition: A | Discharge status: Home / Self Care | Payer: Medicare Other | Source: Ambulatory Visit | Attending: Family | Admitting: Family

## 2017-04-13 DIAGNOSIS — M85852 Other specified disorders of bone density and structure, left thigh: Secondary | ICD-10-CM | POA: Diagnosis not present

## 2017-04-13 DIAGNOSIS — E2839 Other primary ovarian failure: Secondary | ICD-10-CM

## 2017-05-08 DIAGNOSIS — R350 Frequency of micturition: Secondary | ICD-10-CM | POA: Diagnosis not present

## 2017-05-08 DIAGNOSIS — M8580 Other specified disorders of bone density and structure, unspecified site: Secondary | ICD-10-CM | POA: Diagnosis not present

## 2017-05-08 DIAGNOSIS — F418 Other specified anxiety disorders: Secondary | ICD-10-CM | POA: Diagnosis not present

## 2017-05-08 DIAGNOSIS — G250 Essential tremor: Secondary | ICD-10-CM | POA: Diagnosis not present

## 2017-05-15 DIAGNOSIS — H04123 Dry eye syndrome of bilateral lacrimal glands: Secondary | ICD-10-CM | POA: Diagnosis not present

## 2017-05-15 DIAGNOSIS — H16213 Exposure keratoconjunctivitis, bilateral: Secondary | ICD-10-CM | POA: Diagnosis not present

## 2017-05-15 DIAGNOSIS — H524 Presbyopia: Secondary | ICD-10-CM | POA: Diagnosis not present

## 2017-05-15 DIAGNOSIS — H52203 Unspecified astigmatism, bilateral: Secondary | ICD-10-CM | POA: Diagnosis not present

## 2017-05-15 DIAGNOSIS — H02831 Dermatochalasis of right upper eyelid: Secondary | ICD-10-CM | POA: Diagnosis not present

## 2017-05-15 DIAGNOSIS — Z961 Presence of intraocular lens: Secondary | ICD-10-CM | POA: Diagnosis not present

## 2017-05-15 DIAGNOSIS — H0100A Unspecified blepharitis right eye, upper and lower eyelids: Secondary | ICD-10-CM | POA: Diagnosis not present

## 2017-05-15 DIAGNOSIS — H0100B Unspecified blepharitis left eye, upper and lower eyelids: Secondary | ICD-10-CM | POA: Diagnosis not present

## 2017-05-15 DIAGNOSIS — H02834 Dermatochalasis of left upper eyelid: Secondary | ICD-10-CM | POA: Diagnosis not present

## 2017-07-28 DIAGNOSIS — J209 Acute bronchitis, unspecified: Secondary | ICD-10-CM | POA: Diagnosis not present

## 2017-08-13 ENCOUNTER — Encounter: Payer: Self-pay | Admitting: Family Medicine

## 2017-08-13 ENCOUNTER — Ambulatory Visit (INDEPENDENT_AMBULATORY_CARE_PROVIDER_SITE_OTHER): Payer: Medicare Other | Admitting: Family Medicine

## 2017-08-13 VITALS — BP 124/80 | HR 70 | Temp 97.6°F | Ht 64.0 in | Wt 182.2 lb

## 2017-08-13 DIAGNOSIS — Z23 Encounter for immunization: Secondary | ICD-10-CM

## 2017-08-13 DIAGNOSIS — F419 Anxiety disorder, unspecified: Secondary | ICD-10-CM

## 2017-08-13 DIAGNOSIS — G250 Essential tremor: Secondary | ICD-10-CM | POA: Diagnosis not present

## 2017-08-13 MED ORDER — PRIMIDONE 50 MG PO TABS: ORAL_TABLET | ORAL | 1 refills | Status: DC

## 2017-08-13 NOTE — Progress Notes (Signed)
Subjective:  Patient ID: [name], female    DOB: 04-Feb-1934  Age: 84 y.o. MRN: [id number]  CC: Establish Care   HPI [name] presents for establishment of care refill on her primidone.  She was being seen by Dr. Sabra Heck who is currently out of practice.  It is not noted at this time when he was in.  She has been taking primidone as indicated below or essential tremor that involves her head and is worse when she turns her head to the right.  She was originally started on Valium for this tremor and has been on it for years and years at her current dose.  She drinks 2 glasses of wine a night with her friends.  When I asked her about all she said she has not fallen since she had her knees replaced.  She has had a cough over the last week or 2 that has been recently treated with a Z-Pak.  She is feeling better but says that the cough just will not go away.  There is been no phlegm fever chills or wheezing.  She has no history of asthma or COPD.  She never smoked but people around her smoke.  History [name] has a past medical history of Allergy, Chicken pox, Essential tremor, Fracture of humerus, proximal, left, closed (October 28, 2011), GERD (gastroesophageal reflux disease), H/O hiatal hernia, colonic polyps, and Osteoarthritis.   She has a past surgical history that includes bmp (2002); Dilation and curettage of uterus; Colonoscopy w/ polypectomy; arthroscopy left knee; arthroscopy right knee; RIGHT FOOT SURGERIES X 2; Total knee arthroplasty (03/13/2012); Joint replacement (Left); and Total knee arthroplasty (Right, 08/04/2013).   Her family history includes Kidney failure in her unknown relative.She reports that  has never smoked. she has never used smokeless tobacco. She reports that she drinks alcohol. She reports that she does not use drugs.  Outpatient Medications Prior to Visit  Medication Sig Dispense Refill  . diazepam (VALIUM) 5 MG tablet Take 5 mg by mouth 2 (two) times daily.      . DOCOSAHEXAENOIC ACID PO Take 1 capsule by mouth daily.    . hyoscyamine (LEVSIN) 0.125 MG tablet Take 0.125 mg by mouth every 4 (four) hours as needed for cramping.     Marland Kitchen omeprazole (PRILOSEC) 20 MG capsule Take 20 mg by mouth daily.    . primidone (MYSOLINE) 50 MG tablet Take 50 mg by mouth 2 (two) times daily.     Marland Kitchen acetaminophen (TYLENOL) 325 MG tablet Take 2 tablets (650 mg total) by mouth every 6 (six) hours as needed for mild pain (or Fever >/= 101). 40 tablet 0  . bisacodyl (DULCOLAX) 10 MG suppository Place 1 suppository (10 mg total) rectally daily as needed for moderate constipation. 12 suppository 0  . docusate sodium 100 MG CAPS Take 100 mg by mouth 2 (two) times daily. 10 capsule 0  . enoxaparin (LOVENOX) 40 MG/0.4ML injection Inject 0.4 mLs (40 mg total) into the skin daily. Take Lovenox injections daily for seven more days. Once completed the Lovenox, start taking a 325 mg Aspirin daily for four weeks. 7 Syringe 0  . HYDROcodone-acetaminophen (NORCO/VICODIN) 5-325 MG per tablet Take 1-2 tablets by mouth every 4 (four) hours as needed for moderate pain. 80 tablet 0  . methocarbamol (ROBAXIN) 500 MG tablet Take 1 tablet (500 mg total) by mouth every 6 (six) hours as needed for muscle spasms. 80 tablet 0  . metoCLOPramide (REGLAN) 5 MG tablet Take 1-2  tablets (5-10 mg total) by mouth every 8 (eight) hours as needed for nausea (if ondansetron (ZOFRAN) ineffective.). 40 tablet 0  . ondansetron (ZOFRAN) 4 MG tablet Take 1 tablet (4 mg total) by mouth every 6 (six) hours as needed for nausea. 40 tablet 0  . polyethylene glycol (MIRALAX / GLYCOLAX) packet Take 17 g by mouth daily as needed for mild constipation. 14 each 0  . traMADol (ULTRAM) 50 MG tablet Take 1-2 tablets (50-100 mg total) by mouth every 6 (six) hours as needed for moderate pain. 60 tablet 1   No facility-administered medications prior to visit.     ROS Review of Systems  Constitutional: Negative for chills, fatigue  and fever.  HENT: Positive for postnasal drip. Negative for congestion, sinus pressure, sinus pain, sore throat and trouble swallowing.   Eyes: Negative for photophobia and visual disturbance.  Respiratory: Positive for cough. Negative for chest tightness, shortness of breath and wheezing.   Cardiovascular: Negative.   Gastrointestinal: Negative.   Endocrine: Negative for cold intolerance and heat intolerance.  Musculoskeletal: Negative for arthralgias and myalgias.  Skin: Negative for rash.  Neurological: Positive for tremors. Negative for seizures and weakness.  Hematological: Does not bruise/bleed easily.  Psychiatric/Behavioral: The patient is nervous/anxious.     Objective:  BP 124/80 (BP Location: Left Arm, Patient Position: Sitting, Cuff Size: Normal)   Pulse 70   Temp 97.6 F (36.4 C) (Oral)   Ht 5\' 4"  (1.626 m)   Wt 182 lb 4 oz (82.7 kg)   SpO2 93%   BMI 31.28 kg/m   Physical Exam    Assessment & Plan:   [name] was seen today for establish care.  Diagnoses and all orders for this visit:  Tremor, essential -     primidone (MYSOLINE) 50 MG tablet; Take 3 tablets in the morning and 2 in the evening. -     Ambulatory referral to Neurology  Anxiety   I have discontinued [name]'s acetaminophen, bisacodyl, DSS, enoxaparin, HYDROcodone-acetaminophen, methocarbamol, metoCLOPramide, ondansetron, polyethylene glycol, and traMADol. I have also changed her primidone. Additionally, I am having her maintain her hyoscyamine, diazepam, omeprazole, and DOCOSAHEXAENOIC ACID PO.  Meds ordered this encounter  Medications  . primidone (MYSOLINE) 50 MG tablet    Sig: Take 3 tablets in the morning and 2 in the evening.    Dispense:  450 tablet    Refill:  1   Patient is taking tw sedating medicines.  She is consuming some alcohol in the evening.  I expressed my concern to her.  She tells me she has been doing this for years and prior doctors have been aware.  Since it is  not certain when Dr. Sabra Heck will will return to practice I will go ahead and refer to lump our neurologist.  Suggested that she try a nasal steroid for her postnasal drip that I believe is leading to her lingering cough.  She will follow-up if she does not improve.  Follow-up: No Follow-up on file.  Libby Maw, MD

## 2017-08-21 ENCOUNTER — Encounter: Payer: Self-pay | Admitting: Family Medicine

## 2017-10-03 ENCOUNTER — Ambulatory Visit (INDEPENDENT_AMBULATORY_CARE_PROVIDER_SITE_OTHER): Payer: Medicare Other | Admitting: Neurology

## 2017-10-03 ENCOUNTER — Encounter (INDEPENDENT_AMBULATORY_CARE_PROVIDER_SITE_OTHER): Payer: Self-pay

## 2017-10-03 VITALS — BP 154/87 | HR 80 | Wt 183.0 lb

## 2017-10-03 DIAGNOSIS — G250 Essential tremor: Secondary | ICD-10-CM | POA: Diagnosis not present

## 2017-10-03 DIAGNOSIS — Z9181 History of falling: Secondary | ICD-10-CM | POA: Diagnosis not present

## 2017-10-03 MED ORDER — PROPRANOLOL HCL 20 MG PO TABS: ORAL_TABLET | ORAL | 3 refills | Status: DC

## 2017-10-03 NOTE — Progress Notes (Signed)
Subjective:    Patient ID: [name] is a 84 y.o. female.  HPI     Star Age, MD, PhD [facility] [address] [place], [place] [address]  Dear [name],   I saw your patient, [name], upon your kind request in my neurologic clinic today for initial consultation of her tremor. The patient is accompanied by her daughter today. As you know, [name] is an 84 year old right-handed woman with an underlying medical history of allergic rhinitis, reflux, obesity, arthritis, status post bilateral knee replacement surgeries, history of humerus fracture, who has been treated for essential tremor with Mysoline. Her tremor started in her head area, she was in her 61s at the time, with time it has progressed and she also has noted a hand tremor. She used to see [name] in neurology and last saw him about a year ago. She has been on Mysoline 50 mg strength 3 pills in the morning and 2 at night. Of note, she also takes Valium and other sedating medications, she also drinks alcohol on a regular basis, typically every evening in the form of wine, she used to drink every day but about 3 weeks ago she decided not to drink daily. Of note, she fell about a month ago in her driveway. She reports that she had had some alcohol, she fell and could not get up. She was able to call her grandson who had to help her get up. She does not have a call alert button. She has been on Valium for years, started this through her primary care physician some years ago, [name], possibly after she found out that her husband had cancer. She has been on Mysoline for about 7 years, with time it was increased. She is not sure that it is helpful any longer. She has never been on a beta blocker such as propranolol, atenolol, or metoprolol. She does not know of any family member with tremors. Neither parent had tremors, nor grandparents. She is an only child. She has 3 children.  I reviewed your office note from 08/13/2017. She is widowed, she lives alone, she is retired from [employer]. She is a nonsmoker, drinks caffeine in the form of coffee and tea, through the day. Admits that she drinks very little water, maybe one cup per day on average.  Her Past Medical History Is Significant For: Past Medical History:  Diagnosis Date  . Allergy   . Chicken pox   . Essential tremor    of head, sometimes hands.  pt takes primidone to help the tremors-DR. MILLER-NEUROLOGIST IN [place].  PT WAS STARTED ON VALUIM MANY YEARS AGO FOR HER TREMORS--AND CONTINUES TO TAKE.  . Fracture of humerus, proximal, left, closed October 28, 2011   pt has finished physical therapy-limited ROM reaching to her back and unable to lift heavy things with left hand/arm  . GERD (gastroesophageal reflux disease)   . H/O hiatal hernia   . Hx of colonic polyps   . Osteoarthritis    PAIN AND OA BILATERAL KNEES; ALSO HAS ARTHRITIS IN HANDS AND BACK BUT NO BACK PAIN    Her Past Surgical History Is Significant For: Past Surgical History:  Procedure Laterality Date  . arthroscopy left knee    . arthroscopy right knee    . bmp  2002  . COLONOSCOPY W/ POLYPECTOMY    . DILATION AND CURETTAGE OF UTERUS    . JOINT REPLACEMENT Left   .  RIGHT FOOT SURGERIES X 2    . TOTAL KNEE ARTHROPLASTY  03/13/2012   Procedure: TOTAL KNEE ARTHROPLASTY;  Surgeon: Krista Alf, MD;  Location: [facility];  Service: Orthopedics;  Laterality: Left;  steroid injection right knee  . TOTAL KNEE ARTHROPLASTY Right 08/04/2013   Procedure: RIGHT TOTAL KNEE ARTHROPLASTY;  Surgeon: Krista Alf, MD;  Location: [facility];  Service: Orthopedics;  Laterality: Right;    Her Family History Is Significant For: Family History  Problem Relation Age of Onset  . Kidney failure Unknown        renal disease    Her Social History Is Significant For: Social History   Socioeconomic History  . Marital status: Widowed    Spouse name: Not on  file  . Number of children: Not on file  . Years of education: Not on file  . Highest education level: Not on file  Social Needs  . Financial resource strain: Not on file  . Food insecurity - worry: Not on file  . Food insecurity - inability: Not on file  . Transportation needs - medical: Not on file  . Transportation needs - non-medical: Not on file  Occupational History  . Not on file  Tobacco Use  . Smoking status: Never Smoker  . Smokeless tobacco: Never Used  Substance and Sexual Activity  . Alcohol use: Yes    Comment: ONE GLASS WINE DAILY  . Drug use: No  . Sexual activity: Not Currently  Other Topics Concern  . Not on file  Social History Narrative  . Not on file    Her Allergies Are:  Allergies  Allergen Reactions  . Memantine Swelling  . Codeine Nausea And Vomiting  . Sulfamethoxazole Rash    REACTION: unspecified  :   Her Current Medications Are:  Outpatient Encounter Medications as of 10/03/2017  Medication Sig  . diazepam (VALIUM) 5 MG tablet Take 5 mg by mouth 2 (two) times daily.  . DOCOSAHEXAENOIC ACID PO Take 1 capsule by mouth daily.  . hyoscyamine (LEVSIN) 0.125 MG tablet Take 0.125 mg by mouth every 4 (four) hours as needed for cramping.   Marland Kitchen omeprazole (PRILOSEC) 20 MG capsule Take 20 mg by mouth daily.  . primidone (MYSOLINE) 50 MG tablet Take 3 tablets in the morning and 2 in the evening.   No facility-administered encounter medications on file as of 10/03/2017.   : Review of Systems:  Out of a complete 14 point review of systems, all are reviewed and negative with the exception of these symptoms as listed below:  Review of Systems  Neurological:       Patient is here to establish with a new neurologist for her essential tremors. Patient also has a lump/knot on the back of her neck that she would like examined.     Objective:  Neurological Exam  Physical Exam Physical Examination:   Vitals:   10/03/17 1430  BP: (!) 154/87  Pulse: 80     General Examination: The patient is a very pleasant 84 y.o. female in no acute distress. She appears well-developed and well-nourished and well groomed.   HEENT: Normocephalic, atraumatic, pupils are equal, round and reactive to light and accommodation. She reports having a lump in the back of her neck, at the base of first call, upon palpation she has no lymphadenopathy and no lumps or lipomas were palpated. Extraocular tracking is good without limitation to gaze excursion or nystagmus noted. Normal smooth pursuit is noted. Hearing is grossly  intact. Face is symmetric with normal facial animation and normal facial sensation. Speech is clear with no dysarthria noted. There is no hypophonia.she has a fairly consistent head/neck tremor which is side to side, she has a mild lower jaw and lip tremor. She has no tongue tremor, no significant voice tremor, maybe slight. Oropharynx exam reveals: moderate mouth dryness, adequate dental hygiene. Tongue protrudes centrally and palate elevates symmetrically.   Chest: Clear to auscultation without wheezing, rhonchi or crackles noted.  Heart: S1+S2+0, regular and normal without murmurs, rubs or gallops noted.   Abdomen: Soft, non-tender and non-distended with normal bowel sounds appreciated on auscultation.  Extremities: There is nono pitting edema in the distal lower extremities bilaterally.   Skin: Warm and dry without trophic changes noted.   Musculoskeletal: exam reveals no obvious joint deformities, tenderness or joint swelling or erythema, status post bilateral knee replacement surgeries with unremarkable scars, small bruise noted left knee from her recent fall.   Neurologically:  Mental status: The patient is awake, alert and oriented in all 4 spheres. Her immediate and remote memory, attention, language skills and fund of knowledge are appropriate. There is no evidence of aphasia, agnosia, apraxia or anomia. Speech is clear with normal prosody and  enunciation. Thought process is linear. Mood is normal and affect is normal.  Cranial nerves II - XII are as described above under HEENT exam. In addition: shoulder shrug is normal with equal shoulder height noted. Motor exam: Normal bulk, strength and tone is noted. There is no drift.   On 10/03/2017: on Archimedes spiral drawing she has no significant difficulty with the right, she has coarse difficulty with the left, handwriting with the right hand is legible, not particularly tremulous, not particularly micrographic. She has a mild bilateral upper extremity postural and action tremor, no intention tremor. No resting tremor.  Romberg is negative. Reflexes are 1+ throughout. Fine motor skills and coordination: Mildly impaired with him movements, foot agility is fairly good. She stands without difficulty and stance is age appropriate with age-appropriate posture. She is able to stand narrow based. Her walking shows intact arm swing bilaterally. She turns slightly insecurely but otherwise has a benign gait. Cerebellar testing: No dysmetria or intention tremor. There is no truncal or gait ataxia.  Sensory exam: intact to light touch in the upper and lower extremities.   Assessment and Plan:    In summary, Cerina Leary is a very pleasant 84 y.o.-year old female with an underlying medical history of allergic rhinitis, reflux, obesity, arthritis, status post bilateral knee replacement surgeries, history of humerus fracture, who presents for neurologic consultation of her tremor affecting primarily her head, to a lesser degree her hands, with symptoms dating back to her 84s. She has been treated with Mysoline for the past 7+ years with gradual increase in dose. She feels that in the beginning it may have helped but currently is not sure if it does help. Her daughter believes that when she delays her Mysoline does seem to affect her tremor adversely. She has been in the habit of drinking alcohol every  evening. She reports that she has not been drinking daily in the past 3 weeks. I told her that she would not be safe to take Mysoline and drink alcohol on a regular basis at the same time which would put her at high risk for complications, including falls, balance issues, severe sleepiness and sleep disturbance at night. She is strongly advised to abstain from drinking alcohol on a daily basis  while also being on psychotropic medications including Valium and of course the Mysoline. I advised her that I would not maintain her prescription for Valium as it is not for tremor control and not approved for long-term use and certainly not considered a safe medication given her age. I suggested she talk to you about reducing it and perhaps eliminating the Valium. She has been on it for several years. She is advised to talk to you about other ways of managing her history of anxiety. She is advised to be better hydrated with water. She is advised that caffeine even in the form of tea can increase tremors. Talked about tremor triggers in general. This includes low blood sugar values, dehydration, sleep deprivation, anxiety and stress. She lives alone and has fallen in the context of drinking alcohol. She is advised to look into getting a call alert button. She is advised to consider reducing the Mysoline to 2 pills twice a day at this time and we will try a small dose of propranolol, we will have to monitor for side effects such as lightheadedness, dizziness, low blood pressure and low pulse rate. Current blood pressure and previous blood pressure numbers seem to be in the normal range, not in the low blood pressure range. I provided a new prescription for propranolol 20 mg strength starting with half a pill once daily with cautious and slow titration. We talked about potential side effects and expectations with this medication. She is advised to lower her Mysoline to 2 pills twice a day for now. She did not need a  prescription for this. I suggested a six-month follow-up with one of our nurse practitioners, sooner if needed. I answered all their questions today and the patient and her daughter were in agreement.   Thank you very much for allowing me to participate in the care of this nice patient. If I can be of any further assistance to you please do not hesitate to call me at [phone].  Sincerely,   Star Age, MD, PhD

## 2017-10-03 NOTE — Patient Instructions (Addendum)
You should not drink alcohol daily with your current medication regimen. Plus, you have fallen.   Let's reduce the primidone to 2 pills 2 times a day.   Let's try you on a cautious dose of Inderal (generic name: propranolol) 20 mg strength: take 1/2 pill each bedtime for 1 week, then 1/2 pill twice daily for 1 weeks, then 1 pill 2 times a day thereafter.  Common side effect reported are: lethargy, sedation, low blood pressure and low pulse rate. Please monitor your BP and Pulse every few days, if your pulse drops lower than 55 you may feel bad and we may have to adjust your dose. Same with your BP below 110/55.   Please remember, that any kind of tremor may be exacerbated by anxiety, anger, nervousness, excitement, dehydration, sleep deprivation, by caffeine, and low blood sugar values or blood sugar fluctuations. Some medications, especially some antidepressants and lithium can cause or exacerbate tremors. Tremors may temporarily calm down or subside with the use of a benzodiazepine such as Valium or related medications and with alcohol. Be aware, however, that drinking alcohol is not an approved or appropriate treatment for tremor control and long-term use of benzodiazepines such as Valium, lorazepam, alprazolam, or clonazepam can cause habit formation, physical and psychological addiction. There are very few medications that symptomatically help with tremor reduction, none are without potential side effects.    Please look into getting a call alert button, like Life Alert. You have fallen and could not get up, that is of concern.

## 2017-10-16 ENCOUNTER — Other Ambulatory Visit: Payer: Self-pay | Admitting: Family Medicine

## 2017-10-16 NOTE — Telephone Encounter (Signed)
Req. Refill on Diazepam 5 mg; last refill per historical provider 02/11/11 Last office visit: 08/13/17 to establish care PCP Dr. Ethelene Hal Pharmacy:  [facility]

## 2017-10-16 NOTE — Telephone Encounter (Signed)
Copied from [place] [id number]. Topic: Quick Communication - Rx Refill/Question >> Oct 16, 2017 11:36 AM Carolyn Stare wrote: Pt is new to Dr Ethelene Hal and need a new RX sent to [facility]   Medication diazepam (VALIUM) 5 MG tablet  Has the patient contacted their pharmacy yes    Preferred Pharmacy  [facility]   Agent: Please be advised that RX refills may take up to 3 business days. We ask that you follow-up with your pharmacy.

## 2017-10-22 ENCOUNTER — Telehealth: Payer: Self-pay | Admitting: Family Medicine

## 2017-10-22 NOTE — Telephone Encounter (Signed)
Copied from [place]. Topic: General - Other >> Oct 22, 2017 12:06 PM Darl Householder, RMA wrote: Reason for CRM: Medication refill request for diazepam (VALIUM) 5 MG tablet to be sent to [facility]

## 2017-10-22 NOTE — Telephone Encounter (Signed)
Called pt. Re: request for Diazepam refill.  Noted that Dr. Ethelene Hal declined to refill this on previous request 10/16/17.  Stated she had been on this for 25 years, and voiced concern about stopping it abruptly.  Appt. Scheduled for 10/23/17.  Agreed with plan.

## 2017-10-23 ENCOUNTER — Encounter: Payer: Self-pay | Admitting: Family Medicine

## 2017-10-23 ENCOUNTER — Telehealth: Payer: Self-pay

## 2017-10-23 ENCOUNTER — Ambulatory Visit (INDEPENDENT_AMBULATORY_CARE_PROVIDER_SITE_OTHER): Payer: Medicare Other | Admitting: Family Medicine

## 2017-10-23 VITALS — BP 130/76 | HR 79 | Ht 64.0 in | Wt 182.4 lb

## 2017-10-23 DIAGNOSIS — G250 Essential tremor: Secondary | ICD-10-CM | POA: Diagnosis not present

## 2017-10-23 DIAGNOSIS — F419 Anxiety disorder, unspecified: Secondary | ICD-10-CM

## 2017-10-23 DIAGNOSIS — Z789 Other specified health status: Secondary | ICD-10-CM

## 2017-10-23 DIAGNOSIS — F1090 Alcohol use, unspecified, uncomplicated: Secondary | ICD-10-CM | POA: Insufficient documentation

## 2017-10-23 DIAGNOSIS — Z7289 Other problems related to lifestyle: Secondary | ICD-10-CM | POA: Insufficient documentation

## 2017-10-23 NOTE — Telephone Encounter (Signed)
Pt stated after visit today, she do not want to see Dr. Ethelene Hal anymore and she will be transferring care. Pt was offered to see another provider in the practice and she declined.

## 2017-10-23 NOTE — Progress Notes (Signed)
Subjective:  Patient ID: [name], female    DOB: 02/06/1934  Age: 84 y.o. MRN: [id number]  CC: medication follow up   HPI [name] presents for follow up of her tremors and anxiety. She was seen by neurology who had my same concerns with her concomitant use of valium, mysoline and alcohol. Neurology consultant believed that a beta blocker was a safer alternative but  patient  elected to take it. She lives alone and has a history of falls.  Patient takes the valium up to two times daily and does drink wine on a regular basis. She requests that I refill her mysoline again because the neurologist she had seen wouldn't prescribe it for her. She said that she was not happy with the neurologist that she had seen because she didn't order an imaging study for her. I again expressed my concerns about the valium, mysoline and alcohol. She said that she also upset with me because she felt as though I didn't want her as a patient.   Outpatient Medications Prior to Visit  Medication Sig Dispense Refill  . diazepam (VALIUM) 5 MG tablet Take 5 mg by mouth 2 (two) times daily.    . DOCOSAHEXAENOIC ACID PO Take 1 capsule by mouth daily.    . hyoscyamine (LEVSIN) 0.125 MG tablet Take 0.125 mg by mouth every 4 (four) hours as needed for cramping.     Marland Kitchen omeprazole (PRILOSEC) 20 MG capsule Take 20 mg by mouth daily.    . primidone (MYSOLINE) 50 MG tablet Take 3 tablets in the morning and 2 in the evening. 450 tablet 1  . propranolol (INDERAL) 20 MG tablet 1/2 pill each bedtime for 1 week, then 1/2 pill twice daily for 1 week, then 1 pill 2 times a day thereafter. 180 tablet 3   No facility-administered medications prior to visit.     ROS Review of Systems  Constitutional: Negative.   Neurological: Positive for dizziness, tremors and light-headedness.  Psychiatric/Behavioral: Positive for agitation.    Objective:  BP 130/76 (BP Location: Left Arm, Patient Position: Sitting, Cuff Size: Normal)    Pulse 79   Ht 5\' 4"  (1.626 m)   Wt 182 lb 6 oz (82.7 kg)   SpO2 96%   BMI 31.30 kg/m   BP Readings from Last 3 Encounters:  10/23/17 130/76  10/03/17 (!) 154/87  08/13/17 124/80    Wt Readings from Last 3 Encounters:  10/23/17 182 lb 6 oz (82.7 kg)  10/03/17 183 lb (83 kg)  08/13/17 182 lb 4 oz (82.7 kg)    Physical Exam  Constitutional: She is oriented to person, place, and time. She appears well-developed and well-nourished.  HENT:  Head: Normocephalic and atraumatic.  Right Ear: External ear normal.  Left Ear: External ear normal.  Eyes: Right eye exhibits no discharge. Left eye exhibits no discharge. No scleral icterus.  Pulmonary/Chest: Effort normal.  Neurological: She is alert and oriented to person, place, and time.  Skin: She is not diaphoretic.    Lab Results  Component Value Date   WBC 5.5 01/31/2016   HGB 14.1 01/31/2016   HCT 42 01/31/2016   PLT 179 01/31/2016   GLUCOSE 122 (H) 08/06/2013   ALT 23 01/31/2016   AST 30 01/31/2016   NA 140 01/31/2016   K 5.0 01/31/2016   CL 103 08/06/2013   CREATININE 0.9 01/31/2016   BUN 16 01/31/2016   CO2 30 08/06/2013   TSH 1.47 01/31/2016   INR 1.01  07/30/2013    Dg Bone Density (dxa)  Result Date: 04/13/2017 EXAM: DUAL X-RAY ABSORPTIOMETRY (DXA) FOR BONE MINERAL DENSITY IMPRESSION: Referring Physician:  Junie Panning PATIENT: Name: [name] Patient ID: [id number] Birth Date: 01/06/1934 Height: 64.0 in. Sex: Female Measured: 04/13/2017 Weight: 187.6 lbs. Indications: Advanced Age, Anticonvulsant (E855.0), Estrogen Deficient, Nexium, Prilosec, Secondary Osteoporosis Fractures: Shoulder Treatments: Calcium (E943.0) ASSESSMENT: The BMD measured at Femur Neck Left is 0.799 g/cm2 with a T-score of -1.7. This patient is considered osteopenic according to [facility]) criteria. L-3 was excluded due to degenerative changes. There has been a statistically significant increase in BMD of Left  hip, and no statistically significant change in BMD of Lumbar Spine and Right hip since prior exam dated 04/13/2015. Site Region Measured Date Measured Age YA BMD Significant CHANGE T-score DualFemur Neck Left 04/13/2017 81.2 -1.7 0.799 g/cm2 * AP Spine  L1-L4 (L3) 04/13/2017    81.2         -0.7    1.102 g/cm2 World Health Organization [facility]) criteria for post-menopausal, Caucasian Women: Normal       T-score at or above -1 SD Osteopenia   T-score between -1 and -2.5 SD Osteoporosis T-score at or below -2.5 SD RECOMMENDATION: [place] recommends that FDA-approved medical therapies be considered in postmenopausal women and men age 40 or older with a: 1. Hip or vertebral (clinical or morphometric) fracture. 2. T-score of <-2.5 at the spine or hip. 3. Ten-year fracture probability by FRAX of 3% or greater for hip fracture or 20% or greater for major osteoporotic fracture. All treatment decisions require clinical judgment and consideration of individual patient factors, including patient preferences, co-morbidities, previous drug use, risk factors not captured in the FRAX model (e.g. falls, vitamin D deficiency, increased bone turnover, interval significant decline in bone density) and possible under - or over-estimation of fracture risk by FRAX. All patients should ensure an adequate intake of dietary calcium (1200 mg/d) and vitamin D (800 IU daily) unless contraindicated. FOLLOW-UP: People with diagnosed cases of osteoporosis or at high risk for fracture should have regular bone mineral density tests. For patients eligible for Medicare, routine testing is allowed once every 2 years. The testing frequency can be increased to one year for patients who have rapidly progressing disease, those who are receiving or discontinuing medical therapy to restore bone mass, or have additional risk factors. I have reviewed this report, and agree with the above findings. [facility] FRAX* 10-year  Probability of Fracture Based on femoral neck BMD: DualFemur (Left) Major Osteoporotic Fracture: 13.8% Hip Fracture:                3.7% Population:                  [place] (Caucasian) Risk Factors:                Secondary Osteoporosis *FRAX is a Materials engineer of the [employer] for Metabolic Bone Disease, a [place] (WHO) [facility]. ASSESSMENT: The probability of a major osteoporotic fracture is 13.8 % within the next ten years. The probability of a hip fracture is 3. 7 % within the next ten years. Electronically Signed   By: Marijo Conception, M.D.   On: 04/13/2017 14:43    Assessment & Plan:   Loraina was seen today for medication follow up.  Diagnoses and all orders for this visit:  Tremor, essential  Anxiety  Alcohol use  I am having Seda Kronberg maintain her hyoscyamine, diazepam, omeprazole, DOCOSAHEXAENOIC ACID PO, primidone, and propranolol.  No orders of the defined types were placed in this encounter.  I told patient that I felt as though a beta blocker was an entirely appropriate treatment for her tremors. It would also be a safer alternative than mysoline, especially with alcohol in the picture. I again expressed my concern about the safety of valium with any alcohol use and that there were safer alternatives. She then became concerned that I was not going to refill her valium. I told her that she would need to begin a very slow and gradual taper. She then became upset, left the room and said that she was not going to come back. I spent over 20 minutes with this patient discussing these issues with her.   Follow-up: Return if symptoms worsen or fail to improve.  Libby Maw, MD

## 2017-10-26 ENCOUNTER — Telehealth: Payer: Self-pay | Admitting: Family Medicine

## 2017-10-26 NOTE — Telephone Encounter (Signed)
Called patient per her request to schedule meeting end of day 10/24/17.  LMOR.  Called again on 4/11 in the a.m. LMOR.  Talked with son, [name], 4/11 and scheduled meeting 10/26/17.  Patient and Mr. [name] arrived for appointment at 11:30 a.m.  Patient expressed several changes in recent past with providers due to their retirement or change in practice.  So several changes beyond her control.   Advised Dr. Ethelene Hal is not a fit, and requested new PCP.  I will follow up with her on Monday reference this request.  Dr. Ethelene Hal has agreed to transition.  Guido Sander, Admin Team Lead, also met with them to sign up for Adult Proxy in My Chart and more details with Power of [place], etc.

## 2017-10-29 ENCOUNTER — Telehealth: Payer: Self-pay | Admitting: Family Medicine

## 2017-10-29 NOTE — Telephone Encounter (Signed)
Please schedule 30 min OV prior to her running out of Valium. Thanks!

## 2017-10-29 NOTE — Telephone Encounter (Signed)
Talked with patient at 1:35 p.m.  Explained she will be transitioning care to Dr. Deborra Medina per meeting/request 4/12  .  Patient stated she did not reorder Valium in January.  Has approximately 40 pills left.  Has only been taking 1 per day.  Advised Selinda Eon will call her back to schedule next appointment as follow up.

## 2017-10-31 NOTE — Telephone Encounter (Signed)
Patient is scheduled/thx dmf

## 2017-10-31 NOTE — Telephone Encounter (Signed)
Discussed with Selinda Eon.  She will call patient today and schedule.  Selinda Eon please also change PCP to Dr. Deborra Medina in [place].  Let me know if I need to assist.

## 2017-11-13 ENCOUNTER — Ambulatory Visit (INDEPENDENT_AMBULATORY_CARE_PROVIDER_SITE_OTHER): Payer: Medicare Other | Admitting: Family Medicine

## 2017-11-13 ENCOUNTER — Encounter: Payer: Self-pay | Admitting: Family Medicine

## 2017-11-13 VITALS — BP 126/84 | HR 69 | Temp 98.5°F | Ht 64.0 in | Wt 182.2 lb

## 2017-11-13 DIAGNOSIS — G250 Essential tremor: Secondary | ICD-10-CM

## 2017-11-13 DIAGNOSIS — F419 Anxiety disorder, unspecified: Secondary | ICD-10-CM | POA: Diagnosis not present

## 2017-11-13 MED ORDER — DIAZEPAM 2 MG PO TABS: 2.0000 mg | ORAL_TABLET | Freq: Three times a day (TID) | ORAL | 0 refills | Status: DC | PRN

## 2017-11-13 MED ORDER — OMEPRAZOLE 20 MG PO CPDR: 20.0000 mg | DELAYED_RELEASE_CAPSULE | Freq: Every day | ORAL | 3 refills | Status: DC

## 2017-11-13 NOTE — Patient Instructions (Signed)
Great to meet you.   We are changing your valium to 2 mg three times daily as needed.  [facility]  Please call me after your see your neurologist.

## 2017-11-13 NOTE — Progress Notes (Signed)
Subjective:   Patient ID: [name], female    DOB: 1934/04/25, 84 y.o.   MRN: [id number]  [name] is a pleasant 84 y.o. year old female who presents to clinic today with New Patient (Initial Visit) (Patient is here today to establish care.  She states that she is UTD with her immunizations.  Her last wellness was in 2017.  She comments on her weight then states that she feels that it is getting better since she does not drink daily anymore she only has a drink when she goes out with friends.)  on 11/13/2017  HPI:  She would like to discuss treatment for her tremors and anxiety today.  Was started on Valium 5 mg twice daily decades ago by her previous PCP, Dr. Arnoldo Morale, to treat both essential tremor and anxiety per pt.  When she established care with another provider, she was advised that this was not a safe treatment for tremors in someone her age who drinks moderate amounts of alcohol, and she should consider other options.  Of note, history of falls listed in her PMH- per pt, those falls occurred when her knee gave out prior to her knee surgery.  She has had no episodes since.  Since that OV, she has stopped drinking wine daily.  Now only drinks occasionally.  She was referred to a neurologist - Neuro prescribed beta blocker but she would not take them- she read the side effects.  Today she would like to discuss what she should take for her anxiety and her tremors. Current Outpatient Medications on File Prior to Visit  Medication Sig Dispense Refill  . diazepam (VALIUM) 5 MG tablet Take 5 mg by mouth 2 (two) times daily.    . hyoscyamine (LEVSIN) 0.125 MG tablet Take 0.125 mg by mouth every 4 (four) hours as needed for cramping.     Marland Kitchen omeprazole (PRILOSEC) 20 MG capsule Take 20 mg by mouth daily.    . primidone (MYSOLINE) 50 MG tablet Take 3 tablets in the morning and 2 in the evening. 450 tablet 1   No current facility-administered medications on file prior to visit.      Allergies  Allergen Reactions  . Memantine Swelling  . Codeine Nausea And Vomiting  . Sulfamethoxazole Rash    REACTION: unspecified    Past Medical History:  Diagnosis Date  . Allergy   . Chicken pox   . Essential tremor    of head, sometimes hands.  pt takes primidone to help the tremors-DR. MILLER-NEUROLOGIST IN [place].  PT WAS STARTED ON VALUIM MANY YEARS AGO FOR HER TREMORS--AND CONTINUES TO TAKE.  . Fracture of humerus, proximal, left, closed October 28, 2011   pt has finished physical therapy-limited ROM reaching to her back and unable to lift heavy things with left hand/arm  . GERD (gastroesophageal reflux disease)   . H/O hiatal hernia   . Hx of colonic polyps   . Osteoarthritis    PAIN AND OA BILATERAL KNEES; ALSO HAS ARTHRITIS IN HANDS AND BACK BUT NO BACK PAIN    Past Surgical History:  Procedure Laterality Date  . arthroscopy left knee    . arthroscopy right knee    . bmp  2002  . COLONOSCOPY W/ POLYPECTOMY    . DILATION AND CURETTAGE OF UTERUS    . JOINT REPLACEMENT Left   . RIGHT FOOT SURGERIES X 2    . TOTAL KNEE ARTHROPLASTY  03/13/2012   Procedure: TOTAL KNEE ARTHROPLASTY;  Surgeon:  Gearlean Alf, MD;  Location: [facility];  Service: Orthopedics;  Laterality: Left;  steroid injection right knee  . TOTAL KNEE ARTHROPLASTY Right 08/04/2013   Procedure: RIGHT TOTAL KNEE ARTHROPLASTY;  Surgeon: Gearlean Alf, MD;  Location: [facility];  Service: Orthopedics;  Laterality: Right;    Family History  Problem Relation Age of Onset  . Kidney failure Unknown        renal disease    Social History   Socioeconomic History  . Marital status: Widowed    Spouse name: Not on file  . Number of children: Not on file  . Years of education: Not on file  . Highest education level: Not on file  Occupational History  . Not on file  Social Needs  . Financial resource strain: Not on file  . Food insecurity:    Worry: Not on file    Inability: Not on file  .  Transportation needs:    Medical: Not on file    Non-medical: Not on file  Tobacco Use  . Smoking status: Never Smoker  . Smokeless tobacco: Never Used  Substance and Sexual Activity  . Alcohol use: Yes    Comment: ONE GLASS WINE DAILY  . Drug use: No  . Sexual activity: Not Currently  Lifestyle  . Physical activity:    Days per week: Not on file    Minutes per session: Not on file  . Stress: Not on file  Relationships  . Social connections:    Talks on phone: Not on file    Gets together: Not on file    Attends religious service: Not on file    Active member of club or organization: Not on file    Attends meetings of clubs or organizations: Not on file    Relationship status: Not on file  . Intimate partner violence:    Fear of current or ex partner: Not on file    Emotionally abused: Not on file    Physically abused: Not on file    Forced sexual activity: Not on file  Other Topics Concern  . Not on file  Social History Narrative  . Not on file   The PMH, PSH, Social History, Family History, Medications, and allergies have been reviewed in [facility], and have been updated if relevant.   Review of Systems  Neurological: Positive for tremors. Negative for dizziness, seizures, syncope, facial asymmetry, speech difficulty, weakness, light-headedness, numbness and headaches.  Psychiatric/Behavioral: Negative for agitation, behavioral problems, confusion, decreased concentration, dysphoric mood, hallucinations, self-injury, sleep disturbance and suicidal ideas. The patient is nervous/anxious. The patient is not hyperactive.   All other systems reviewed and are negative.      Objective:    BP 126/84 (BP Location: Left Arm, Patient Position: Sitting, Cuff Size: Normal)   Pulse 69   Temp 98.5 F (36.9 C) (Oral)   Ht 5\' 4"  (1.626 m)   Wt 182 lb 3.2 oz (82.6 kg)   SpO2 95%   BMI 31.27 kg/m    Physical Exam   General:  Well-developed,well-nourished,in no acute distress;  alert,appropriate and cooperative throughout examination Head:  normocephalic and atraumatic.   Eyes:  vision grossly intact, PERRL Ears:  R ear normal and L ear normal externally, TMs clear bilaterally Nose:  no external deformity.   Mouth:  good dentition.   Neck:  No deformities, masses, or tenderness noted.   Lungs:  Normal respiratory effort, chest expands symmetrically. Lungs are clear to auscultation, no crackles or  wheezes. Heart:  Normal rate and regular rhythm. S1 and S2 normal without gallop, murmur, click, rub or other extra sounds. Msk:  No deformity or scoliosis noted of thoracic or lumbar spine.   Extremities:  No clubbing, cyanosis, edema, or deformity noted with normal full range of motion of all joints.   Neurologic:  alert & oriented X3 and gait normal.   + visible essential tremor of her head and hands Skin:  Intact without suspicious lesions or rashes Psych:  Cognition and judgment appear intact. Alert and cooperative with normal attention span and concentration. No apparent delusions, illusions, hallucinations       Assessment & Plan:   Tremor, essential  Anxiety No follow-ups on file.

## 2017-11-13 NOTE — Assessment & Plan Note (Addendum)
>  25 minutes spent in face to face time with patient, >50% spent in counselling or coordination of care discussing her essential tremor and anxiety treatment.  Explained to [name] that medicine has changed since she was first prescribed a twice daily benzo for her tremors and anxiety.  We are now more aware of the potential sedation and falls risks associated with these rxs, especially above the age of 50.  We also discussed why a beta blocker and possibly something like buspar for anxiety would be better choices.  After a lengthy discussion, she agreed to see her neurologist again to discuss the beta blocker and does agree to consider other rxs after that appointment. She also agreed to allow me to start a slow wean off of valium- will D/c valium 5 mg twice daily and start valium 2 mg three times daily prn and advised her to only take it AS NEEDED. She will update me after she sees her neurologist. The patient indicates understanding of these issues, agrees and seems pleased with this discussion and plan.

## 2017-11-28 DIAGNOSIS — G479 Sleep disorder, unspecified: Secondary | ICD-10-CM | POA: Diagnosis not present

## 2017-11-28 DIAGNOSIS — G4733 Obstructive sleep apnea (adult) (pediatric): Secondary | ICD-10-CM | POA: Insufficient documentation

## 2017-11-28 DIAGNOSIS — G250 Essential tremor: Secondary | ICD-10-CM | POA: Diagnosis not present

## 2017-11-28 DIAGNOSIS — Z9181 History of falling: Secondary | ICD-10-CM | POA: Diagnosis not present

## 2017-11-28 DIAGNOSIS — R269 Unspecified abnormalities of gait and mobility: Secondary | ICD-10-CM | POA: Diagnosis not present

## 2017-12-21 ENCOUNTER — Telehealth: Payer: Self-pay | Admitting: Family Medicine

## 2017-12-21 MED ORDER — DIAZEPAM 2 MG PO TABS: 2.0000 mg | ORAL_TABLET | Freq: Three times a day (TID) | ORAL | 0 refills | Status: DC | PRN

## 2017-12-21 NOTE — Telephone Encounter (Signed)
Yes okay to refill one time only.  Thank you for the update as well.

## 2017-12-21 NOTE — Telephone Encounter (Signed)
Rx printed, will fax once its sign by Dr. Deborra Medina Monday.

## 2017-12-21 NOTE — Telephone Encounter (Signed)
Received rx request from [facility] for Diazepam tabs 2 mg #90 with 0 refills. Take 1 tab q 8 hrs PRN for anxiety (tremor). Fax to [phone]  Dr. Deborra Medina ok to refill this med? Pt still has some--can wait until Monday, fax rx in only.   Pt wants to let Dr. Deborra Medina know that she saw Dr. Vick Frees (neurologist) he advise her to take beta blocker 1/2 tab and to follow up with him on 02/28/18. FYI

## 2017-12-26 ENCOUNTER — Encounter: Payer: Self-pay | Admitting: Family Medicine

## 2017-12-26 ENCOUNTER — Ambulatory Visit (INDEPENDENT_AMBULATORY_CARE_PROVIDER_SITE_OTHER): Payer: Medicare Other | Admitting: Family Medicine

## 2017-12-26 ENCOUNTER — Ambulatory Visit (INDEPENDENT_AMBULATORY_CARE_PROVIDER_SITE_OTHER): Payer: Medicare Other

## 2017-12-26 VITALS — BP 110/66 | HR 62 | Temp 97.9°F | Ht 64.0 in | Wt 182.0 lb

## 2017-12-26 DIAGNOSIS — M542 Cervicalgia: Secondary | ICD-10-CM

## 2017-12-26 NOTE — Assessment & Plan Note (Signed)
Consistent with muscle spasm. Will get xray of her neck today to rule out DDD/spurs as source of spasm. The patient indicates understanding of these issues and agrees with the plan. Orders Placed This Encounter  Procedures  . DG Cervical Spine Complete
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
## 2017-12-26 NOTE — Patient Instructions (Signed)
Great to see you. I will call you with your results from today. 

## 2017-12-27 DIAGNOSIS — M542 Cervicalgia: Secondary | ICD-10-CM | POA: Diagnosis not present

## 2018-01-01 ENCOUNTER — Other Ambulatory Visit: Payer: Self-pay | Admitting: Family Medicine

## 2018-01-01 DIAGNOSIS — M5030 Other cervical disc degeneration, unspecified cervical region: Secondary | ICD-10-CM

## 2018-01-11 DIAGNOSIS — M542 Cervicalgia: Secondary | ICD-10-CM | POA: Diagnosis not present

## 2018-01-11 DIAGNOSIS — M4692 Unspecified inflammatory spondylopathy, cervical region: Secondary | ICD-10-CM | POA: Diagnosis not present

## 2018-01-23 DIAGNOSIS — M542 Cervicalgia: Secondary | ICD-10-CM | POA: Diagnosis not present

## 2018-01-28 DIAGNOSIS — M542 Cervicalgia: Secondary | ICD-10-CM | POA: Diagnosis not present

## 2018-01-31 DIAGNOSIS — M542 Cervicalgia: Secondary | ICD-10-CM | POA: Diagnosis not present

## 2018-02-04 DIAGNOSIS — M542 Cervicalgia: Secondary | ICD-10-CM | POA: Diagnosis not present

## 2018-02-07 DIAGNOSIS — M542 Cervicalgia: Secondary | ICD-10-CM | POA: Diagnosis not present

## 2018-02-11 DIAGNOSIS — M542 Cervicalgia: Secondary | ICD-10-CM | POA: Diagnosis not present

## 2018-02-12 ENCOUNTER — Other Ambulatory Visit: Payer: Self-pay

## 2018-02-12 MED ORDER — DIAZEPAM 2 MG PO TABS: 2.0000 mg | ORAL_TABLET | Freq: Three times a day (TID) | ORAL | 0 refills | Status: DC | PRN

## 2018-02-12 NOTE — Progress Notes (Signed)
Prepared Rx of [facility]/will fax when signed/thx dmf

## 2018-02-14 DIAGNOSIS — M542 Cervicalgia: Secondary | ICD-10-CM | POA: Diagnosis not present

## 2018-02-18 DIAGNOSIS — M542 Cervicalgia: Secondary | ICD-10-CM | POA: Diagnosis not present

## 2018-02-21 DIAGNOSIS — M542 Cervicalgia: Secondary | ICD-10-CM | POA: Diagnosis not present

## 2018-02-25 DIAGNOSIS — M542 Cervicalgia: Secondary | ICD-10-CM | POA: Diagnosis not present

## 2018-02-28 DIAGNOSIS — G4733 Obstructive sleep apnea (adult) (pediatric): Secondary | ICD-10-CM | POA: Diagnosis not present

## 2018-02-28 DIAGNOSIS — R269 Unspecified abnormalities of gait and mobility: Secondary | ICD-10-CM | POA: Diagnosis not present

## 2018-02-28 DIAGNOSIS — G250 Essential tremor: Secondary | ICD-10-CM | POA: Diagnosis not present

## 2018-02-28 DIAGNOSIS — Z9181 History of falling: Secondary | ICD-10-CM | POA: Diagnosis not present

## 2018-02-28 DIAGNOSIS — G479 Sleep disorder, unspecified: Secondary | ICD-10-CM | POA: Diagnosis not present

## 2018-03-02 DIAGNOSIS — M542 Cervicalgia: Secondary | ICD-10-CM | POA: Diagnosis not present

## 2018-03-08 DIAGNOSIS — M542 Cervicalgia: Secondary | ICD-10-CM | POA: Diagnosis not present

## 2018-04-08 ENCOUNTER — Ambulatory Visit: Payer: Medicare Other | Admitting: Nurse Practitioner

## 2018-04-23 ENCOUNTER — Other Ambulatory Visit: Payer: Self-pay

## 2018-04-23 MED ORDER — DIAZEPAM 2 MG PO TABS: 2.0000 mg | ORAL_TABLET | Freq: Three times a day (TID) | ORAL | 2 refills | Status: DC | PRN

## 2018-04-23 NOTE — Addendum Note (Signed)
Addended by: Marrion Coy on: 04/23/2018 12:24 PM   Modules accepted: Orders

## 2018-04-23 NOTE — Telephone Encounter (Signed)
TA-Pt is needing a refill of Diazepam/I will call her in the morning to schedule her AWV with Glenard Haring and F/U with you same day within a couple of months/plz advise/thx dmf

## 2018-04-23 NOTE — Telephone Encounter (Signed)
Copied from [place] [id number]. Topic: Inquiry >> Apr 23, 2018 10:00 AM Oliver Pila B wrote: Reason for CRM: pt called to speak w/ the pcp or nurse; pt was inquiring if she needed to come in for an appt for a medication refill; contact to advise

## 2018-05-17 DIAGNOSIS — H5202 Hypermetropia, left eye: Secondary | ICD-10-CM | POA: Diagnosis not present

## 2018-05-17 DIAGNOSIS — H02834 Dermatochalasis of left upper eyelid: Secondary | ICD-10-CM | POA: Diagnosis not present

## 2018-05-17 DIAGNOSIS — Z961 Presence of intraocular lens: Secondary | ICD-10-CM | POA: Diagnosis not present

## 2018-05-17 DIAGNOSIS — H02831 Dermatochalasis of right upper eyelid: Secondary | ICD-10-CM | POA: Diagnosis not present

## 2018-05-17 DIAGNOSIS — H04123 Dry eye syndrome of bilateral lacrimal glands: Secondary | ICD-10-CM | POA: Diagnosis not present

## 2018-05-17 DIAGNOSIS — H524 Presbyopia: Secondary | ICD-10-CM | POA: Diagnosis not present

## 2018-05-17 DIAGNOSIS — H52203 Unspecified astigmatism, bilateral: Secondary | ICD-10-CM | POA: Diagnosis not present

## 2018-05-17 DIAGNOSIS — H26492 Other secondary cataract, left eye: Secondary | ICD-10-CM | POA: Diagnosis not present

## 2018-05-30 DIAGNOSIS — Z9181 History of falling: Secondary | ICD-10-CM | POA: Diagnosis not present

## 2018-05-30 DIAGNOSIS — G4733 Obstructive sleep apnea (adult) (pediatric): Secondary | ICD-10-CM | POA: Diagnosis not present

## 2018-05-30 DIAGNOSIS — G479 Sleep disorder, unspecified: Secondary | ICD-10-CM | POA: Diagnosis not present

## 2018-05-30 DIAGNOSIS — G250 Essential tremor: Secondary | ICD-10-CM | POA: Diagnosis not present

## 2018-05-30 DIAGNOSIS — R269 Unspecified abnormalities of gait and mobility: Secondary | ICD-10-CM | POA: Diagnosis not present

## 2018-06-17 ENCOUNTER — Encounter: Payer: Self-pay | Admitting: Family Medicine

## 2018-06-17 ENCOUNTER — Ambulatory Visit (INDEPENDENT_AMBULATORY_CARE_PROVIDER_SITE_OTHER): Payer: Medicare Other | Admitting: Family Medicine

## 2018-06-17 ENCOUNTER — Ambulatory Visit (HOSPITAL_BASED_OUTPATIENT_CLINIC_OR_DEPARTMENT_OTHER)
Admission: RE | Admit: 2018-06-17 | Discharge: 2018-06-17 | Disposition: A | Discharge status: Home / Self Care | Payer: Medicare Other | Source: Ambulatory Visit | Attending: Family Medicine | Admitting: Family Medicine

## 2018-06-17 VITALS — BP 116/64 | HR 70 | Temp 98.4°F | Ht 64.0 in | Wt 182.4 lb

## 2018-06-17 DIAGNOSIS — R1031 Right lower quadrant pain: Secondary | ICD-10-CM | POA: Diagnosis not present

## 2018-06-17 DIAGNOSIS — Z23 Encounter for immunization: Secondary | ICD-10-CM

## 2018-06-17 DIAGNOSIS — K5730 Diverticulosis of large intestine without perforation or abscess without bleeding: Secondary | ICD-10-CM | POA: Diagnosis not present

## 2018-06-17 LAB — URINALYSIS, ROUTINE W REFLEX MICROSCOPIC
Bilirubin Urine: NEGATIVE
Hgb urine dipstick: NEGATIVE
Ketones, ur: NEGATIVE
Nitrite: NEGATIVE
RBC / HPF: NONE SEEN (ref 0–?)
Specific Gravity, Urine: <=1.005 — AB (ref 1.000–1.030)
Total Protein, Urine: NEGATIVE
Urine Glucose: NEGATIVE
Urobilinogen, UA: 0.2 (ref 0.0–1.0)
pH: 6.5 (ref 5.0–8.0)

## 2018-06-17 LAB — COMPREHENSIVE METABOLIC PANEL
ALBUMIN: 4.4 g/dL (ref 3.5–5.2)
ALT: 26 U/L (ref 0–35)
AST: 27 U/L (ref 0–37)
Alkaline Phosphatase: 72 U/L (ref 39–117)
BUN: 14 mg/dL (ref 6–23)
CHLORIDE: 102 meq/L (ref 96–112)
CO2: 27 mEq/L (ref 19–32)
Calcium: 9.5 mg/dL (ref 8.4–10.5)
Creatinine, Ser: 0.88 mg/dL (ref 0.40–1.20)
GFR: 65.31 mL/min (ref >60.00)
Glucose, Bld: 101 mg/dL — ABNORMAL HIGH (ref 70–99)
Potassium: 4.3 mEq/L (ref 3.5–5.1)
Sodium: 137 mEq/L (ref 135–145)
Total Bilirubin: 0.6 mg/dL (ref 0.2–1.2)
Total Protein: 7.5 g/dL (ref 6.0–8.3)

## 2018-06-17 LAB — CBC
HEMATOCRIT: 42.1 % (ref 36.0–46.0)
Hemoglobin: 14.5 g/dL (ref 12.0–15.0)
MCHC: 34.5 g/dL (ref 30.0–36.0)
MCV: 97.7 fl (ref 78.0–100.0)
Platelets: 189 10*3/uL (ref 150.0–400.0)
RBC: 4.31 Mil/uL (ref 3.87–5.11)
RDW: 13.2 % (ref 11.5–15.5)
WBC: 5 10*3/uL (ref 4.0–10.5)

## 2018-06-17 MED ORDER — AMOXICILLIN-POT CLAVULANATE 875-125 MG PO TABS: 1.0000 | ORAL_TABLET | Freq: Two times a day (BID) | ORAL | 0 refills | Status: AC

## 2018-06-17 NOTE — Assessment & Plan Note (Signed)
Ongoing for weeks, acutely worse- very tender on exam with rebound and guarding.  CT of abd/pelvis today- CBC, CMET. ? Diverticulitis, less likely appendicitis as she has had some symptoms for weeks but this is also possible. Start her on augmentin twice daily x 10 days for presumed diverticulitis as CT results pending. The patient indicates understanding of these issues and agrees with the plan.

## 2018-06-17 NOTE — Patient Instructions (Signed)
Great to see you.  We will call you with your lab results and CT scan results ( please stop by to schedule your CT scan on your way out).  Take Augmentin as directed- 1 tablet twice daily x 10 days.

## 2018-06-17 NOTE — Progress Notes (Signed)
Subjective:   Patient ID: [name], female    DOB: 1934/03/10, 84 y.o.   MRN: [id number]  [name] is a pleasant 84 y.o. year old female who presents to clinic today with Abdominal Pain (Patient is here today C/O right lumbar abd pain x4wks.  The only time it doesn't bother her is when she is completely still. Her BM's are strange as she states "a week ago I couldn't go at all. I either can't go or go a lot."  She states she takes Miralax when she can't go.  The pain goes to the front through to her back when she moves or pushes on the area.)  on 06/17/2018  HPI: Right abdominal pain x 4 weeks. Only bothers her when she is completely still.  Bowel movements have been "strange."  Either constipated or having diarrhea.  Taking Miralax for constipation.   Colonoscopy from 03/20/2007- diverticulosis.  No fever or chills.  No nausea or vomiting.  She thinks this pain has been acutely worse the last 3 days.  Very tender in RLQ when she bends over or moves.  Current Outpatient Medications on File Prior to Visit  Medication Sig Dispense Refill  . diazepam (VALIUM) 2 MG tablet Take 1 tablet (2 mg total) by mouth every 8 (eight) hours as needed for anxiety (tremor). 90 tablet 2  . gabapentin (NEURONTIN) 100 MG capsule Take 100 mg by mouth 4 (four) times daily.    . hyoscyamine (LEVSIN) 0.125 MG tablet Take 0.125 mg by mouth every 4 (four) hours as needed for cramping.     Marland Kitchen omeprazole (PRILOSEC) 20 MG capsule Take 1 capsule (20 mg total) by mouth daily. 90 capsule 3  . primidone (MYSOLINE) 50 MG tablet Take 3 tablets in the morning and 2 in the evening. 450 tablet 1   No current facility-administered medications on file prior to visit.     Allergies  Allergen Reactions  . Memantine Swelling  . Propranolol Other (See Comments)    Made her head feel very funny Made her head feel very funny   . Codeine Nausea And Vomiting  . Sulfamethoxazole Rash    REACTION: unspecified    Past  Medical History:  Diagnosis Date  . Allergy   . Chicken pox   . Essential tremor    of head, sometimes hands.  pt takes primidone to help the tremors-DR. MILLER-NEUROLOGIST IN [place].  PT WAS STARTED ON VALUIM MANY YEARS AGO FOR HER TREMORS--AND CONTINUES TO TAKE.  . Fracture of humerus, proximal, left, closed October 28, 2011   pt has finished physical therapy-limited ROM reaching to her back and unable to lift heavy things with left hand/arm  . GERD (gastroesophageal reflux disease)   . H/O hiatal hernia   . Hx of colonic polyps   . Osteoarthritis    PAIN AND OA BILATERAL KNEES; ALSO HAS ARTHRITIS IN HANDS AND BACK BUT NO BACK PAIN    Past Surgical History:  Procedure Laterality Date  . arthroscopy left knee    . arthroscopy right knee    . bmp  2002  . COLONOSCOPY W/ POLYPECTOMY    . DILATION AND CURETTAGE OF UTERUS    . JOINT REPLACEMENT Left   . RIGHT FOOT SURGERIES X 2    . TOTAL KNEE ARTHROPLASTY  03/13/2012   Procedure: TOTAL KNEE ARTHROPLASTY;  Surgeon: Gearlean Alf, MD;  Location: [facility];  Service: Orthopedics;  Laterality: Left;  steroid injection right knee  .  TOTAL KNEE ARTHROPLASTY Right 08/04/2013   Procedure: RIGHT TOTAL KNEE ARTHROPLASTY;  Surgeon: Gearlean Alf, MD;  Location: [facility];  Service: Orthopedics;  Laterality: Right;    Family History  Problem Relation Age of Onset  . Kidney failure Unknown        renal disease    Social History   Socioeconomic History  . Marital status: Widowed    Spouse name: Not on file  . Number of children: Not on file  . Years of education: Not on file  . Highest education level: Not on file  Occupational History  . Not on file  Social Needs  . Financial resource strain: Not on file  . Food insecurity:    Worry: Not on file    Inability: Not on file  . Transportation needs:    Medical: Not on file    Non-medical: Not on file  Tobacco Use  . Smoking status: Never Smoker  . Smokeless tobacco: Never Used    Substance and Sexual Activity  . Alcohol use: Yes    Comment: ONE GLASS WINE DAILY  . Drug use: No  . Sexual activity: Not Currently  Lifestyle  . Physical activity:    Days per week: Not on file    Minutes per session: Not on file  . Stress: Not on file  Relationships  . Social connections:    Talks on phone: Not on file    Gets together: Not on file    Attends religious service: Not on file    Active member of club or organization: Not on file    Attends meetings of clubs or organizations: Not on file    Relationship status: Not on file  . Intimate partner violence:    Fear of current or ex partner: Not on file    Emotionally abused: Not on file    Physically abused: Not on file    Forced sexual activity: Not on file  Other Topics Concern  . Not on file  Social History Narrative  . Not on file   The PMH, PSH, Social History, Family History, Medications, and allergies have been reviewed in [facility], and have been updated if relevant.   Review of Systems  Constitutional: Negative.   Gastrointestinal: Positive for abdominal pain, constipation and diarrhea. Negative for abdominal distention, anal bleeding, blood in stool, nausea, rectal pain and vomiting.  Genitourinary: Negative.   All other systems reviewed and are negative.      Objective:    BP 116/64 (BP Location: Left Arm, Patient Position: Sitting, Cuff Size: Normal)   Pulse 70   Temp 98.4 F (36.9 C) (Oral)   Ht 5\' 4"  (1.626 m)   Wt 182 lb 6.4 oz (82.7 kg)   SpO2 97%   BMI 31.31 kg/m    Physical Exam  Constitutional: She is oriented to person, place, and time. She appears well-developed and well-nourished.  Non-toxic appearance.  HENT:  Head: Normocephalic and atraumatic.  Eyes: EOM are normal.  Cardiovascular: Normal rate.  Pulmonary/Chest: Effort normal.  Abdominal: Bowel sounds are normal. There is tenderness in the right lower quadrant. There is rebound and guarding. There is no rigidity.   Neurological: She is alert and oriented to person, place, and time.  Skin: Skin is warm.  Psychiatric: She has a normal mood and affect. Her behavior is normal.  Nursing note and vitals reviewed.         Assessment & Plan:   Right lower quadrant abdominal pain -  Plan: CT RENAL STONE STUDY, CBC, Comprehensive metabolic panel, Urinalysis, Routine w reflex microscopic  Need for influenza vaccination - Plan: Flu vaccine HIGH DOSE PF  RLQ abdominal pain No follow-ups on file.

## 2018-06-20 ENCOUNTER — Other Ambulatory Visit: Payer: Self-pay | Admitting: Family Medicine

## 2018-06-20 ENCOUNTER — Other Ambulatory Visit: Payer: Medicare Other

## 2018-06-20 DIAGNOSIS — R1031 Right lower quadrant pain: Secondary | ICD-10-CM | POA: Diagnosis not present

## 2018-06-21 LAB — URINE CULTURE
MICRO NUMBER:: 91457941
Result:: NO GROWTH
SPECIMEN QUALITY:: ADEQUATE

## 2018-09-04 ENCOUNTER — Other Ambulatory Visit: Payer: Self-pay

## 2018-09-04 NOTE — Progress Notes (Unsigned)
TA-LOV 12.2.20/Last filled 12.19.19 #90 with SIG 1q8h/[place] PMP ok no red flags pt is compliant/plz advise/thx dmf

## 2018-09-05 MED ORDER — DIAZEPAM 2 MG PO TABS: 2.0000 mg | ORAL_TABLET | Freq: Three times a day (TID) | ORAL | 1 refills | Status: DC | PRN

## 2018-09-05 NOTE — Progress Notes (Unsigned)
eRx refill sent. 

## 2018-10-02 DIAGNOSIS — R269 Unspecified abnormalities of gait and mobility: Secondary | ICD-10-CM | POA: Diagnosis not present

## 2018-10-02 DIAGNOSIS — G250 Essential tremor: Secondary | ICD-10-CM | POA: Diagnosis not present

## 2018-10-02 DIAGNOSIS — Z9181 History of falling: Secondary | ICD-10-CM | POA: Diagnosis not present

## 2018-10-02 DIAGNOSIS — G4733 Obstructive sleep apnea (adult) (pediatric): Secondary | ICD-10-CM | POA: Diagnosis not present

## 2018-10-28 ENCOUNTER — Ambulatory Visit (INDEPENDENT_AMBULATORY_CARE_PROVIDER_SITE_OTHER): Payer: Medicare Other | Admitting: Family Medicine

## 2018-10-28 DIAGNOSIS — J309 Allergic rhinitis, unspecified: Secondary | ICD-10-CM | POA: Diagnosis not present

## 2018-10-28 NOTE — Assessment & Plan Note (Signed)
Most consistent allergic rhinitis. Feels better today since it rained last night. Try starting xyzal at bedtime tonight and she will update me in the morning. She feels better since it rained, does not seem consistent with bacterial infection at this time. The patient indicates understanding of these issues and agrees with the plan.

## 2018-10-28 NOTE — Progress Notes (Signed)
TELEPHONE ENCOUNTER   Patient verbally agreed to telephone visit and is aware that copayment and coinsurance may apply. Patient was treated using telemedicine according to accepted telemedicine protocols.  Location of the patient: home  Location of provider: [facility] Names of all persons participating in the telemedicine service and role in the encounter: Arnette Norris, MD  patient  Subjective:   Chief Complaint  Patient presents with  . Eye Problem    Patient agrees to virtual visit/She states that she has her eyes have swollen after she used her daughters' Flonase.  She is fearful of using it again.  She does not feels like she has run a fever. Has had sneezing. Slight dry cough. Has intermittent maxillary sinus pressure. Uses Refresh in eye 2-3x daily since cataracts removed due to overdry eyes. Feel grity and have been using warm compresses on eyes. Constant nasal drainage. Xyzal is only thing has not tried but has it at home.     HPI   URI symptoms-  Has had URI symptoms for 3 weeks. She thinks symptoms are mainly allergies- no fever.    Has had sneezing. Slight dry cough. Has intermittent maxillary sinus pressure. Uses Refresh in eye 2-3x daily since cataracts removed due to overdry eyes. Feel grity and have been using warm compresses on eyes. Constant nasal drainage. Xyzal is only thing has not tried but has it at home.  ROS: See pertinent positives and negatives per HPI.  Review of Systems  Constitutional: Negative for fever.  HENT: Positive for congestion, facial swelling, postnasal drip, rhinorrhea, sinus pressure and sinus pain. Negative for dental problem, drooling, ear discharge, ear pain, hearing loss, mouth sores, nosebleeds, sneezing, sore throat, tinnitus, trouble swallowing and voice change.   Eyes: Negative.   Respiratory: Positive for cough. Negative for shortness of breath, wheezing and stridor.   Cardiovascular: Negative.   Gastrointestinal: Negative.    Endocrine: Negative.   Genitourinary: Negative.   Musculoskeletal: Negative.   Skin: Negative.   Allergic/Immunologic: Negative.   Neurological: Negative.   Hematological: Negative.   Psychiatric/Behavioral: Negative.   Patient Active Problem List   Diagnosis Date Noted  . Allergic rhinitis 10/28/2018  . RLQ abdominal pain 06/17/2018  . Alcohol use 10/23/2017  . GERD (gastroesophageal reflux disease) 10/29/2013  . OA (osteoarthritis) of knee 03/13/2012  . LOC OSTEOARTHROS NOT SPEC PRIM/SEC LOWER LEG 08/03/2010  . BACK PAIN, LUMBAR, WITH RADICULOPATHY 07/20/2010  . CERVICALGIA 06/02/2009  . OTHER CHRONIC OTITIS EXTERNA 03/01/2009  . Anxiety 09/26/2007  . DIVERTICULOSIS, COLON 09/26/2007  . ANAL FISSURE 09/26/2007  . ARTHRITIS 09/26/2007  . FATTY LIVER DISEASE 08/14/2007  . UNS ADVRS EFF UNS RX MEDICINAL&BIOLOGICAL SBSTNC 07/30/2007  . ENLARGEMENT OF LYMPH NODES 07/26/2007  . IRRITABLE BOWEL SYNDROME 05/15/2007  . ABNORMAL RESULT, FUNCTION STUDY, LIVER 05/15/2007  . COLONIC POLYPS, HX OF 05/15/2007  . DISORDER, DEPRESSIVE NEC 02/13/2007  . Tremor, essential 02/11/2007  . OSTEOARTHRITIS 02/11/2007   Social History   Tobacco Use  . Smoking status: Never Smoker  . Smokeless tobacco: Never Used  Substance Use Topics  . Alcohol use: Yes    Comment: ONE GLASS WINE DAILY    Current Outpatient Medications:  .  diazepam (VALIUM) 2 MG tablet, Take 1 tablet (2 mg total) by mouth every 8 (eight) hours as needed for anxiety (tremor)., Disp: 180 tablet, Rfl: 1 .  gabapentin (NEURONTIN) 100 MG capsule, Take 100 mg by mouth 4 (four) times daily., Disp: , Rfl:  .  hyoscyamine (  LEVSIN) 0.125 MG tablet, Take 0.125 mg by mouth every 4 (four) hours as needed for cramping. , Disp: , Rfl:  .  omeprazole (PRILOSEC) 20 MG capsule, Take 1 capsule (20 mg total) by mouth daily., Disp: 90 capsule, Rfl: 3 .  primidone (MYSOLINE) 50 MG tablet, Take 3 tablets in the morning and 2 in the evening.,  Disp: 450 tablet, Rfl: 1 Allergies  Allergen Reactions  . Memantine Swelling  . Propranolol Other (See Comments)    Made her head feel very funny Made her head feel very funny   . Codeine Nausea And Vomiting  . Sulfamethoxazole Rash    REACTION: unspecified    Assessment & Plan:   1. Allergic rhinitis, unspecified seasonality, unspecified trigger     No orders of the defined types were placed in this encounter.  No orders of the defined types were placed in this encounter.   Arnette Norris, MD 10/28/2018  Time spent with the patient: 15 minutes, spent in obtaining information about her symptoms, reviewing her previous labs, evaluations, and treatments, counseling her about her condition (please see the discussed topics above), and developing a plan to further investigate it; she had a number of questions which I addressed.   [id number] physician/qualified health professional telephone evaluation 5 to 10 minutes [id number] physician/qualified help functional [place] evaluation for 11 to 20 minutes [id number] physician/qualify he will professional telephone evaluation for 21 to 30 minutes

## 2019-01-21 ENCOUNTER — Other Ambulatory Visit: Payer: Self-pay | Admitting: Family Medicine

## 2019-01-21 MED ORDER — DIAZEPAM 2 MG PO TABS: 2.0000 mg | ORAL_TABLET | Freq: Three times a day (TID) | ORAL | 1 refills | Status: DC | PRN

## 2019-02-14 ENCOUNTER — Other Ambulatory Visit: Payer: Self-pay

## 2019-03-05 ENCOUNTER — Other Ambulatory Visit: Payer: Self-pay | Admitting: Family Medicine

## 2019-04-23 DIAGNOSIS — Z9181 History of falling: Secondary | ICD-10-CM | POA: Diagnosis not present

## 2019-04-23 DIAGNOSIS — R269 Unspecified abnormalities of gait and mobility: Secondary | ICD-10-CM | POA: Diagnosis not present

## 2019-04-23 DIAGNOSIS — G250 Essential tremor: Secondary | ICD-10-CM | POA: Diagnosis not present

## 2019-04-23 DIAGNOSIS — G479 Sleep disorder, unspecified: Secondary | ICD-10-CM | POA: Diagnosis not present

## 2019-04-23 DIAGNOSIS — G4733 Obstructive sleep apnea (adult) (pediatric): Secondary | ICD-10-CM | POA: Diagnosis not present

## 2019-04-29 ENCOUNTER — Other Ambulatory Visit: Payer: Self-pay

## 2019-04-30 ENCOUNTER — Ambulatory Visit (INDEPENDENT_AMBULATORY_CARE_PROVIDER_SITE_OTHER): Payer: Medicare Other | Admitting: Family Medicine

## 2019-04-30 ENCOUNTER — Ambulatory Visit: Payer: Medicare Other

## 2019-04-30 ENCOUNTER — Encounter: Payer: Self-pay | Admitting: Family Medicine

## 2019-04-30 ENCOUNTER — Telehealth: Payer: Self-pay

## 2019-04-30 VITALS — BP 133/72 | HR 72 | Temp 98.1°F | Wt 177.8 lb

## 2019-04-30 DIAGNOSIS — Z23 Encounter for immunization: Secondary | ICD-10-CM

## 2019-04-30 DIAGNOSIS — R5383 Other fatigue: Secondary | ICD-10-CM | POA: Insufficient documentation

## 2019-04-30 DIAGNOSIS — E559 Vitamin D deficiency, unspecified: Secondary | ICD-10-CM | POA: Diagnosis not present

## 2019-04-30 DIAGNOSIS — R0602 Shortness of breath: Secondary | ICD-10-CM

## 2019-04-30 DIAGNOSIS — M859 Disorder of bone density and structure, unspecified: Secondary | ICD-10-CM

## 2019-04-30 DIAGNOSIS — R030 Elevated blood-pressure reading, without diagnosis of hypertension: Secondary | ICD-10-CM | POA: Diagnosis not present

## 2019-04-30 DIAGNOSIS — R7989 Other specified abnormal findings of blood chemistry: Secondary | ICD-10-CM

## 2019-04-30 LAB — CBC WITH DIFFERENTIAL/PLATELET
Basophils Absolute: 0.1 10*3/uL (ref 0.0–0.1)
Basophils Relative: 1.1 % (ref 0.0–3.0)
Eosinophils Absolute: 0.2 10*3/uL (ref 0.0–0.7)
Eosinophils Relative: 3.5 % (ref 0.0–5.0)
HCT: 41.2 % (ref 36.0–46.0)
Hemoglobin: 14.2 g/dL (ref 12.0–15.0)
Lymphocytes Relative: 45.1 % (ref 12.0–46.0)
Lymphs Abs: 2.2 10*3/uL (ref 0.7–4.0)
MCHC: 34.3 g/dL (ref 30.0–36.0)
MCV: 97.7 fl (ref 78.0–100.0)
Monocytes Absolute: 0.5 10*3/uL (ref 0.1–1.0)
Monocytes Relative: 9.1 % (ref 3.0–12.0)
Neutro Abs: 2 10*3/uL (ref 1.4–7.7)
Neutrophils Relative %: 41.2 % — ABNORMAL LOW (ref 43.0–77.0)
Platelets: 185 10*3/uL (ref 150.0–400.0)
RBC: 4.22 Mil/uL (ref 3.87–5.11)
RDW: 12.8 % (ref 11.5–15.5)
WBC: 5 10*3/uL (ref 4.0–10.5)

## 2019-04-30 LAB — TSH: TSH: 3.73 u[IU]/mL (ref 0.35–4.50)

## 2019-04-30 LAB — VITAMIN D 25 HYDROXY (VIT D DEFICIENCY, FRACTURES): VITD: 10.19 ng/mL — ABNORMAL LOW (ref 30.00–100.00)

## 2019-04-30 LAB — COMPREHENSIVE METABOLIC PANEL
ALT: 20 U/L (ref 0–35)
AST: 20 U/L (ref 0–37)
Albumin: 4.3 g/dL (ref 3.5–5.2)
Alkaline Phosphatase: 78 U/L (ref 39–117)
BUN: 11 mg/dL (ref 6–23)
CO2: 25 mEq/L (ref 19–32)
Calcium: 9.4 mg/dL (ref 8.4–10.5)
Chloride: 102 mEq/L (ref 96–112)
Creatinine, Ser: 0.82 mg/dL (ref 0.40–1.20)
GFR: 66.53 mL/min (ref >60.00)
Glucose, Bld: 96 mg/dL (ref 70–99)
Potassium: 4.2 mEq/L (ref 3.5–5.1)
Sodium: 138 mEq/L (ref 135–145)
Total Bilirubin: 0.5 mg/dL (ref 0.2–1.2)
Total Protein: 7 g/dL (ref 6.0–8.3)

## 2019-04-30 LAB — T4, FREE: Free T4: 0.78 ng/dL (ref 0.60–1.60)

## 2019-04-30 LAB — FERRITIN: Ferritin: 166.7 ng/mL (ref 10.0–291.0)

## 2019-04-30 NOTE — Assessment & Plan Note (Signed)
New- and her blood pressure is fluctuating even in our office today.  I am leary to start blood pressure medication without further work up as her BP is usually in the 117-120/70s range.   EKG reassuring- NSR- unchanged from prior EKG from 02/2011. Will order CXR, labs prior to starting any antihypertensive given her other complaints and his acute onset of fluctuating blood pressure. The patient indicates understanding of these issues and agrees with the plan. Orders Placed This Encounter  Procedures  . DG Chest 2 View  . Flu Vaccine QUAD High Dose(Fluad)  . CBC with Differential/Platelet  . Ferritin  . TSH  . T4, free  . Vitamin D (25 hydroxy)  . Pathologist smear review  . Comprehensive metabolic panel  . Troponin I  . EKG 12-Lead

## 2019-04-30 NOTE — Progress Notes (Signed)
Subjective:   Patient ID: [name], female    DOB: Sep 18, 1933, 85 y.o.   MRN: [id number]  [name] is a pleasant 85 y.o. year old female who presents to clinic today with Discuss BP (Pt is here today to discuss BP. She agrees to get her flu shot. She is fasting. She went to Neurologist and her BP was 170/80. She brought her machine with her.)  on 04/30/2019  HPI:  Here to discuss blood pressure. She does not have a diagnosis of HTN.  At neurologist's office, BP was 170/80.  She has been checking her blood pressure at home- ranging 130s- 190s/ 77-90s. Head feels heavy and and feels tired.  Denies any CP, does have intermittent SOB.  No blurred vision.  Denies feeling depressed- she is under stress with her family but does not feel depressed.  She has never felt this "drained.". Lab Results  Component Value Date   TSH 1.47 01/31/2016   Lab Results  Component Value Date   ALT 26 06/17/2018   AST 27 06/17/2018   ALKPHOS 72 06/17/2018   BILITOT 0.6 06/17/2018   Lab Results  Component Value Date   CREATININE 0.88 06/17/2018    Lab Results  Component Value Date   WBC 5.0 06/17/2018   HGB 14.5 06/17/2018   HCT 42.1 06/17/2018   MCV 97.7 06/17/2018   PLT 189.0 06/17/2018   .  BP Readings from Last 3 Encounters:  04/30/19 133/72  06/17/18 116/64  12/26/17 110/66   Current Outpatient Medications on File Prior to Visit  Medication Sig Dispense Refill   diazepam (VALIUM) 2 MG tablet Take 1 tablet (2 mg total) by mouth every 8 (eight) hours as needed for anxiety (tremor). 180 tablet 1   hyoscyamine (LEVSIN) 0.125 MG tablet Take 0.125 mg by mouth every 4 (four) hours as needed for cramping.      omeprazole (PRILOSEC) 20 MG capsule TAKE 1 CAPSULE DAILY 90 capsule 3   primidone (MYSOLINE) 50 MG tablet Take 3 tablets in the morning and 2 in the evening. 450 tablet 1   No current facility-administered medications on file prior to visit.     Allergies  Allergen  Reactions   Memantine Swelling   Propranolol Other (See Comments)    Made her head feel very funny Made her head feel very funny    Codeine Nausea And Vomiting   Sulfamethoxazole Rash    REACTION: unspecified    Past Medical History:  Diagnosis Date   Allergy    Chicken pox    Essential tremor    of head, sometimes hands.  pt takes primidone to help the tremors-DR. MILLER-NEUROLOGIST IN [place].  PT WAS STARTED ON VALUIM MANY YEARS AGO FOR HER TREMORS--AND CONTINUES TO TAKE.   Fracture of humerus, proximal, left, closed October 28, 2011   pt has finished physical therapy-limited ROM reaching to her back and unable to lift heavy things with left hand/arm   GERD (gastroesophageal reflux disease)    H/O hiatal hernia    Hx of colonic polyps    Osteoarthritis    PAIN AND OA BILATERAL KNEES; ALSO HAS ARTHRITIS IN HANDS AND BACK BUT NO BACK PAIN    Past Surgical History:  Procedure Laterality Date   arthroscopy left knee     arthroscopy right knee     bmp  2002   COLONOSCOPY W/ POLYPECTOMY     DILATION AND CURETTAGE OF UTERUS     JOINT REPLACEMENT Left  RIGHT FOOT SURGERIES X 2     TOTAL KNEE ARTHROPLASTY  03/13/2012   Procedure: TOTAL KNEE ARTHROPLASTY;  Surgeon: Gearlean Alf, MD;  Location: [facility];  Service: Orthopedics;  Laterality: Left;  steroid injection right knee   TOTAL KNEE ARTHROPLASTY Right 08/04/2013   Procedure: RIGHT TOTAL KNEE ARTHROPLASTY;  Surgeon: Gearlean Alf, MD;  Location: [facility];  Service: Orthopedics;  Laterality: Right;    Family History  Problem Relation Age of Onset   Kidney failure Unknown        renal disease    Social History   Socioeconomic History   Marital status: Widowed    Spouse name: Not on file   Number of children: Not on file   Years of education: Not on file   Highest education level: Not on file  Occupational History   Not on file  Social Needs   Financial resource strain: Not on file    Food insecurity    Worry: Not on file    Inability: Not on file   Transportation needs    Medical: Not on file    Non-medical: Not on file  Tobacco Use   Smoking status: Never Smoker   Smokeless tobacco: Never Used  Substance and Sexual Activity   Alcohol use: Yes    Comment: ONE GLASS WINE DAILY   Drug use: No   Sexual activity: Not Currently  Lifestyle   Physical activity    Days per week: Not on file    Minutes per session: Not on file   Stress: Not on file  Relationships   Social connections    Talks on phone: Not on file    Gets together: Not on file    Attends religious service: Not on file    Active member of club or organization: Not on file    Attends meetings of clubs or organizations: Not on file    Relationship status: Not on file   Intimate partner violence    Fear of current or ex partner: Not on file    Emotionally abused: Not on file    Physically abused: Not on file    Forced sexual activity: Not on file  Other Topics Concern   Not on file  Social History Narrative   Not on file   The PMH, PSH, Social History, Family History, Medications, and allergies have been reviewed in [facility], and have been updated if relevant.    Review of Systems  Constitutional: Positive for fatigue.  HENT: Negative.   Eyes: Negative.   Respiratory: Positive for shortness of breath. Negative for wheezing and stridor.   Cardiovascular: Negative.   Gastrointestinal: Negative.   Endocrine: Negative.   Genitourinary: Negative.   Musculoskeletal: Negative.   Allergic/Immunologic: Negative.   Neurological: Negative.   Hematological: Negative.   Psychiatric/Behavioral: Negative.   All other systems reviewed and are negative.      Objective:    BP 133/72 (BP Location: Left Arm, Cuff Size: Normal) Comment: Patient cuff   Pulse 72    Temp 98.1 F (36.7 C) (Oral)    Wt 177 lb 12.8 oz (80.6 kg)    SpO2 97%    BMI 30.52 kg/m    Physical Exam   General:   Well-developed,well-nourished,in no acute distress; alert,appropriate and cooperative throughout examination Head:  normocephalic and atraumatic.   Eyes:  vision grossly intact, PERRL Ears:  R ear normal and L ear normal externally, TMs clear bilaterally Nose:  no external deformity.  Mouth:  good dentition.   Neck:  No deformities, masses, or tenderness noted.  Lungs:  Normal respiratory effort, chest expands symmetrically. Lungs are clear to auscultation, no crackles or wheezes. Heart:  Normal rate and regular rhythm. S1 and S2 normal without gallop, murmur, click, rub or other extra sounds. Abdomen:  Bowel sounds positive,abdomen soft and non-tender without masses, organomegaly or hernias noted. Msk:  No deformity or scoliosis noted of thoracic or lumbar spine.   Extremities:  No clubbing, cyanosis, edema, or deformity noted with normal full range of motion of all joints.   Neurologic:  alert & oriented X3 and gait normal.   Skin:  Intact without suspicious lesions or rashes Cervical Nodes:  No lymphadenopathy noted Axillary Nodes:  No palpable lymphadenopathy Psych:  Cognition and judgment appear intact. Alert and cooperative with normal attention span and concentration. No apparent delusions, illusions, hallucinations     Assessment & Plan:   Elevated blood pressure reading without diagnosis of hypertension - Plan: CBC with Differential/Platelet, Ferritin, TSH, T4, free, Troponin I  Need for influenza vaccination - Plan: Flu Vaccine QUAD High Dose(Fluad)  Other fatigue - Plan: CBC with Differential/Platelet, Ferritin, TSH, T4, free, Vitamin D (25 hydroxy), Pathologist smear review, Comprehensive metabolic panel, Troponin I  Shortness of breath - Plan: DG Chest 2 View  Other specified abnormal findings of blood chemistry  - Plan: Ferritin  Disorder of bone density and structure, unspecified  - Plan: Vitamin D (25 hydroxy) No follow-ups on file.

## 2019-04-30 NOTE — Addendum Note (Signed)
Addended by: Lynnea Ferrier on: 04/30/2019 04:02 PM   Modules accepted: Orders

## 2019-04-30 NOTE — Patient Instructions (Signed)
Great to see you. I will call you with your lab results from today and you can view them online.   

## 2019-04-30 NOTE — Telephone Encounter (Signed)
I called and lvm for pt that x ray was working now and her x ray can be done.

## 2019-04-30 NOTE — Addendum Note (Signed)
Addended by: Lynnea Ferrier on: 04/30/2019 10:58 AM   Modules accepted: Orders

## 2019-05-01 ENCOUNTER — Other Ambulatory Visit: Payer: Self-pay | Admitting: Family Medicine

## 2019-05-01 DIAGNOSIS — E559 Vitamin D deficiency, unspecified: Secondary | ICD-10-CM

## 2019-05-01 LAB — PATHOLOGIST SMEAR REVIEW

## 2019-05-01 MED ORDER — VITAMIN D (ERGOCALCIFEROL) 1.25 MG (50000 UNIT) PO CAPS: 50000.0000 [IU] | ORAL_CAPSULE | ORAL | 0 refills | Status: DC

## 2019-05-21 ENCOUNTER — Other Ambulatory Visit: Payer: Self-pay | Admitting: Family Medicine

## 2019-05-22 DIAGNOSIS — H5203 Hypermetropia, bilateral: Secondary | ICD-10-CM | POA: Diagnosis not present

## 2019-05-22 DIAGNOSIS — H52203 Unspecified astigmatism, bilateral: Secondary | ICD-10-CM | POA: Diagnosis not present

## 2019-05-22 DIAGNOSIS — H524 Presbyopia: Secondary | ICD-10-CM | POA: Diagnosis not present

## 2019-05-22 DIAGNOSIS — H04123 Dry eye syndrome of bilateral lacrimal glands: Secondary | ICD-10-CM | POA: Diagnosis not present

## 2019-05-22 DIAGNOSIS — H26493 Other secondary cataract, bilateral: Secondary | ICD-10-CM | POA: Diagnosis not present

## 2019-05-22 DIAGNOSIS — Z961 Presence of intraocular lens: Secondary | ICD-10-CM | POA: Diagnosis not present

## 2019-05-22 NOTE — Telephone Encounter (Signed)
TA-Plz see refill req/LOV in Oct/[facility] 7.7.20/per [place] PMP pt is compliant without red flags/thx dmf

## 2019-06-28 DIAGNOSIS — Z20828 Contact with and (suspected) exposure to other viral communicable diseases: Secondary | ICD-10-CM | POA: Diagnosis not present

## 2019-08-04 ENCOUNTER — Other Ambulatory Visit: Payer: Self-pay

## 2019-08-04 ENCOUNTER — Other Ambulatory Visit (INDEPENDENT_AMBULATORY_CARE_PROVIDER_SITE_OTHER): Payer: Medicare Other

## 2019-08-04 DIAGNOSIS — E559 Vitamin D deficiency, unspecified: Secondary | ICD-10-CM

## 2019-08-04 LAB — VITAMIN D 25 HYDROXY (VIT D DEFICIENCY, FRACTURES): VITD: 42.59 ng/mL (ref 30.00–100.00)

## 2019-08-04 NOTE — Addendum Note (Signed)
Addended by: Lynnea Ferrier on: 08/04/2019 10:55 AM   Modules accepted: Orders

## 2019-08-04 NOTE — Progress Notes (Signed)
TELEPHONE ENCOUNTER   Patient verbally agreed to telephone visit and is aware that copayment and coinsurance may apply. Patient was treated using telemedicine according to accepted telemedicine protocols.  Location of the patient: Patient's home              Location of provider: Provider's home      Names of all persons participating in the telemedicine service and role in the encounter: Arnette Norris, MD, Angelica Chessman  Subjective:   Chief Complaint  Patient presents with  . Follow-up    vitamin D deficiency     HPI:   Follow up Vitamin D-  She also had her Vit-D drawn yesterday and has risen from 10.19 to 42.59. She is wondering if she needs a refill of Vit-D.  Patient Active Problem List   Diagnosis Date Noted  . Vitamin D deficiency 08/05/2019  . Elevated blood pressure reading without diagnosis of hypertension 04/30/2019  . Fatigue 04/30/2019  . Allergic rhinitis 10/28/2018  . RLQ abdominal pain 06/17/2018  . Alcohol use 10/23/2017  . GERD (gastroesophageal reflux disease) 10/29/2013  . OA (osteoarthritis) of knee 03/13/2012  . LOC OSTEOARTHROS NOT SPEC PRIM/SEC LOWER LEG 08/03/2010  . BACK PAIN, LUMBAR, WITH RADICULOPATHY 07/20/2010  . CERVICALGIA 06/02/2009  . OTHER CHRONIC OTITIS EXTERNA 03/01/2009  . Anxiety 09/26/2007  . DIVERTICULOSIS, COLON 09/26/2007  . ANAL FISSURE 09/26/2007  . ARTHRITIS 09/26/2007  . FATTY LIVER DISEASE 08/14/2007  . UNS ADVRS EFF UNS RX MEDICINAL&BIOLOGICAL SBSTNC 07/30/2007  . ENLARGEMENT OF LYMPH NODES 07/26/2007  . IRRITABLE BOWEL SYNDROME 05/15/2007  . ABNORMAL RESULT, FUNCTION STUDY, LIVER 05/15/2007  . COLONIC POLYPS, HX OF 05/15/2007  . DISORDER, DEPRESSIVE NEC 02/13/2007  . Tremor, essential 02/11/2007  . OSTEOARTHRITIS 02/11/2007   Social History   Tobacco Use  . Smoking status: Never Smoker  . Smokeless tobacco: Never Used  Substance Use Topics  . Alcohol use: Yes    Comment: ONE GLASS WINE DAILY    Current  Outpatient Medications:  .  diazepam (VALIUM) 2 MG tablet, TAKE 1 TABLET EVERY 8 HOURS AS NEEDED FOR ANXIETY AND TREMOR, Disp: 180 tablet, Rfl: 1 .  hyoscyamine (LEVSIN) 0.125 MG tablet, Take 0.125 mg by mouth every 4 (four) hours as needed for cramping. , Disp: , Rfl:  .  omeprazole (PRILOSEC) 20 MG capsule, TAKE 1 CAPSULE DAILY, Disp: 90 capsule, Rfl: 3 .  primidone (MYSOLINE) 50 MG tablet, Take 3 tablets in the morning and 2 in the evening., Disp: 450 tablet, Rfl: 1 .  Vitamin D, Ergocalciferol, (DRISDOL) 1.25 MG (50000 UNIT) CAPS capsule, Take 1 capsule (50,000 Units total) by mouth every 7 (seven) days., Disp: 7 capsule, Rfl: 0 Allergies  Allergen Reactions  . Memantine Swelling  . Propranolol Other (See Comments)    Made her head feel very funny Made her head feel very funny   . Codeine Nausea And Vomiting  . Sulfamethoxazole Rash    REACTION: unspecified      Arnette Norris, MD 08/05/2019  Time spent with the patient: 11 minutes, spent in obtaining information about her symptoms, reviewing her previous labs, evaluations, and treatments, counseling her about her condition (please see the discussed topics above), and developing a plan to further investigate it; she had a number of questions which I addressed.   [id number] physician/qualified health professional telephone evaluation 5 to 10 minutes [phone] physician/qualified help functional [place] evaluation for 11 to 20 minutes [id number] physician/qualify he will professional telephone evaluation for 21 to  30 minutes   Lab Results  Component Value Date   WBC 5.0 04/30/2019   HGB 14.2 04/30/2019   HCT 41.2 04/30/2019   PLT 185.0 04/30/2019   GLUCOSE 96 04/30/2019   ALT 20 04/30/2019   AST 20 04/30/2019   NA 138 04/30/2019   K 4.2 04/30/2019   CL 102 04/30/2019   CREATININE 0.82 04/30/2019   BUN 11 04/30/2019   CO2 25 04/30/2019   TSH 3.73 04/30/2019   INR 1.01 07/30/2013    Lab Results  Component Value Date   TSH 3.73  04/30/2019   Lab Results  Component Value Date   WBC 5.0 04/30/2019   HGB 14.2 04/30/2019   HCT 41.2 04/30/2019   MCV 97.7 04/30/2019   PLT 185.0 04/30/2019   Lab Results  Component Value Date   NA 138 04/30/2019   K 4.2 04/30/2019   CO2 25 04/30/2019   GLUCOSE 96 04/30/2019   BUN 11 04/30/2019   CREATININE 0.82 04/30/2019   BILITOT 0.5 04/30/2019   ALKPHOS 78 04/30/2019   AST 20 04/30/2019   ALT 20 04/30/2019   PROT 7.0 04/30/2019   ALBUMIN 4.3 04/30/2019   CALCIUM 9.4 04/30/2019   GFR 66.53 04/30/2019   No results found for: CHOL No results found for: HDL No results found for: LDLCALC No results found for: TRIG No results found for: CHOLHDL No results found for: HGBA1C     Assessment & Plan:   Problem List Items Addressed This Visit      Active Problems   Vitamin D deficiency    History: Lab Results  Component Value Date   VD25OH 42.59 08/04/2019   VD25OH 10.19 (L) 04/30/2019    Last dose of vitamin D 50000 weekly was last week.  She does feel less dizzy, less fatigued, less achy.  Assessment/Plan:       Goal vitamin D level > 40. Plan: Vitamin D supplementation. Since her symptoms are improving, will repeat shorter course of high dose vitamin d for 7 more weeks (weekly) and she will start OTC vitamin D 4000 IU.  The patient indicates understanding of these issues and agrees with the plan.           I have changed Sunday Spillers Fross's Vitamin D (Ergocalciferol). I am also having her maintain her hyoscyamine, primidone, omeprazole, and diazepam.  Meds ordered this encounter  Medications  . Vitamin D, Ergocalciferol, (DRISDOL) 1.25 MG (50000 UNIT) CAPS capsule    Sig: Take 1 capsule (50,000 Units total) by mouth every 7 (seven) days.    Dispense:  7 capsule    Refill:  0     Arnette Norris, MD

## 2019-08-04 NOTE — Patient Instructions (Signed)
Please call [facility] at [phone] to schedule your Mammogram :)

## 2019-08-05 ENCOUNTER — Ambulatory Visit (INDEPENDENT_AMBULATORY_CARE_PROVIDER_SITE_OTHER): Payer: Medicare Other | Admitting: Family Medicine

## 2019-08-05 DIAGNOSIS — E559 Vitamin D deficiency, unspecified: Secondary | ICD-10-CM

## 2019-08-05 MED ORDER — VITAMIN D (ERGOCALCIFEROL) 1.25 MG (50000 UNIT) PO CAPS: 50000.0000 [IU] | ORAL_CAPSULE | ORAL | 0 refills | Status: DC

## 2019-08-05 NOTE — Assessment & Plan Note (Signed)
History: Lab Results  Component Value Date   VD25OH 42.59 08/04/2019   VD25OH 10.19 (L) 04/30/2019    Last dose of vitamin D 50000 weekly was last week.  She does feel less dizzy, less fatigued, less achy.  Assessment/Plan:       Goal vitamin D level > 40. Plan: Vitamin D supplementation. Since her symptoms are improving, will repeat shorter course of high dose vitamin d for 7 more weeks (weekly) and she will start OTC vitamin D 4000 IU.  The patient indicates understanding of these issues and agrees with the plan.

## 2019-08-27 DIAGNOSIS — Z9181 History of falling: Secondary | ICD-10-CM | POA: Diagnosis not present

## 2019-08-27 DIAGNOSIS — G4733 Obstructive sleep apnea (adult) (pediatric): Secondary | ICD-10-CM | POA: Diagnosis not present

## 2019-08-27 DIAGNOSIS — G250 Essential tremor: Secondary | ICD-10-CM | POA: Diagnosis not present

## 2019-08-27 DIAGNOSIS — G479 Sleep disorder, unspecified: Secondary | ICD-10-CM | POA: Diagnosis not present

## 2019-08-27 DIAGNOSIS — R269 Unspecified abnormalities of gait and mobility: Secondary | ICD-10-CM | POA: Diagnosis not present

## 2019-09-08 ENCOUNTER — Telehealth: Payer: Self-pay | Admitting: General Practice

## 2019-09-08 NOTE — Telephone Encounter (Signed)
Patient is calling and requesting a TOC to Dr. Bryan Lemma. Pld advise. CB is [phone]

## 2019-09-09 NOTE — Telephone Encounter (Signed)
MC-Pt is requesting to schedule a TOC visit with you/plz advise/thx dmf 

## 2019-09-10 NOTE — Telephone Encounter (Signed)
Plz call pt to sched TOC visit with Dr. C/thx dmf 

## 2019-09-10 NOTE — Telephone Encounter (Signed)
Ok with me 

## 2019-09-12 ENCOUNTER — Telehealth: Payer: Self-pay | Admitting: Family Medicine

## 2019-09-12 NOTE — Telephone Encounter (Signed)
Scheduled appt.

## 2019-09-12 NOTE — Telephone Encounter (Signed)
Pt asked if Sharyn Lull could give her a call back, she has a question for her

## 2019-09-15 DIAGNOSIS — B029 Zoster without complications: Secondary | ICD-10-CM | POA: Diagnosis not present

## 2019-09-15 NOTE — Telephone Encounter (Signed)
Spoke to pt/she is going to an U/C about a possible insect bite on her buttock/thx dmf

## 2019-09-18 ENCOUNTER — Ambulatory Visit: Payer: Medicare Other

## 2019-10-02 ENCOUNTER — Ambulatory Visit: Payer: Medicare Other | Attending: Internal Medicine

## 2019-10-02 DIAGNOSIS — Z23 Encounter for immunization: Secondary | ICD-10-CM

## 2019-10-02 NOTE — Progress Notes (Signed)
   Covid-19 Vaccination Clinic  Name:  [name]    MRN: [id number] DOB: 1934/06/05  10/02/2019  [name] was observed post Covid-19 immunization for 15 minutes without incident. She was provided with Vaccine Information Sheet and instruction to access the V-Safe system.   [name] was instructed to call 911 with any severe reactions post vaccine: Marland Kitchen Difficulty breathing  . Swelling of face and throat  . A fast heartbeat  . A bad rash all over body  . Dizziness and weakness   Immunizations Administered    Name Date Dose VIS Date Route   Pfizer COVID-19 Vaccine 10/02/2019  1:04 PM 0.3 mL 06/27/2019 Intramuscular   Manufacturer: [place]   Lot: [id number]   [place]: [id number]

## 2019-10-20 NOTE — Patient Instructions (Addendum)
There are no preventive care reminders to display for this patient.  Depression screen [facility] 2/9 11/13/2017  Decreased Interest 0  Down, Depressed, Hopeless 0  PHQ - 2 Score 0   Use nasal saline spray 2-3x/day  Take zyrtec 1 tab daily at night Use flonase 2 sprays each nostril daily at night Cont eye drops

## 2019-10-24 ENCOUNTER — Ambulatory Visit (INDEPENDENT_AMBULATORY_CARE_PROVIDER_SITE_OTHER): Payer: Medicare Other | Admitting: Family Medicine

## 2019-10-24 ENCOUNTER — Other Ambulatory Visit: Payer: Self-pay

## 2019-10-24 ENCOUNTER — Encounter: Payer: Self-pay | Admitting: Family Medicine

## 2019-10-24 VITALS — BP 110/70 | HR 74 | Temp 97.5°F | Ht 64.0 in | Wt 177.0 lb

## 2019-10-24 DIAGNOSIS — G250 Essential tremor: Secondary | ICD-10-CM

## 2019-10-24 DIAGNOSIS — F419 Anxiety disorder, unspecified: Secondary | ICD-10-CM

## 2019-10-24 DIAGNOSIS — K589 Irritable bowel syndrome without diarrhea: Secondary | ICD-10-CM | POA: Diagnosis not present

## 2019-10-24 DIAGNOSIS — K219 Gastro-esophageal reflux disease without esophagitis: Secondary | ICD-10-CM | POA: Diagnosis not present

## 2019-10-24 DIAGNOSIS — J302 Other seasonal allergic rhinitis: Secondary | ICD-10-CM

## 2019-10-24 MED ORDER — DIAZEPAM 2 MG PO TABS: ORAL_TABLET | ORAL | 1 refills | Status: DC

## 2019-10-24 MED ORDER — OMEPRAZOLE 20 MG PO CPDR: 20.0000 mg | DELAYED_RELEASE_CAPSULE | Freq: Every day | ORAL | 3 refills | Status: DC

## 2019-10-24 MED ORDER — HYOSCYAMINE SULFATE 0.125 MG PO TABS: 0.1250 mg | ORAL_TABLET | ORAL | 1 refills | Status: DC | PRN

## 2019-10-24 NOTE — Progress Notes (Signed)
[name] is a 86 y.o. female  Chief Complaint  Patient presents with  . Establish Care    Pt here for TOC and follow up.    HPI: [name] is a 86 y.o. female here for a TOC appt, previous PCP Dr. Deborra Medina. She needs refills of 2 meds - xanax and levsin. She has been on both x > 1 year.  She complains of seasonal allergy symptoms - itchy eyes, runny nose, stuffy nose. She takes zyrtec PRN bc it makes her tired. She has flonase but does not use it.   Past Medical History:  Diagnosis Date  . Allergy   . Chicken pox   . Essential tremor    of head, sometimes hands.  pt takes primidone to help the tremors-DR. MILLER-NEUROLOGIST IN [place].  PT WAS STARTED ON VALUIM MANY YEARS AGO FOR HER TREMORS--AND CONTINUES TO TAKE.  . Fracture of humerus, proximal, left, closed October 28, 2011   pt has finished physical therapy-limited ROM reaching to her back and unable to lift heavy things with left hand/arm  . GERD (gastroesophageal reflux disease)   . H/O hiatal hernia   . Hx of colonic polyps   . Osteoarthritis    PAIN AND OA BILATERAL KNEES; ALSO HAS ARTHRITIS IN HANDS AND BACK BUT NO BACK PAIN    Past Surgical History:  Procedure Laterality Date  . arthroscopy left knee    . arthroscopy right knee    . bmp  2002  . COLONOSCOPY W/ POLYPECTOMY    . DILATION AND CURETTAGE OF UTERUS    . JOINT REPLACEMENT Left   . RIGHT FOOT SURGERIES X 2    . TOTAL KNEE ARTHROPLASTY  03/13/2012   Procedure: TOTAL KNEE ARTHROPLASTY;  Surgeon: Gearlean Alf, MD;  Location: [facility];  Service: Orthopedics;  Laterality: Left;  steroid injection right knee  . TOTAL KNEE ARTHROPLASTY Right 08/04/2013   Procedure: RIGHT TOTAL KNEE ARTHROPLASTY;  Surgeon: Gearlean Alf, MD;  Location: [facility];  Service: Orthopedics;  Laterality: Right;    Social History   Socioeconomic History  . Marital status: Widowed    Spouse name: Not on file  . Number of children: Not on file  . Years of education: Not on  file  . Highest education level: Not on file  Occupational History  . Not on file  Tobacco Use  . Smoking status: Never Smoker  . Smokeless tobacco: Never Used  Substance and Sexual Activity  . Alcohol use: Yes    Comment: ONE GLASS WINE DAILY  . Drug use: No  . Sexual activity: Not Currently  Other Topics Concern  . Not on file  Social History Narrative  . Not on file   Social Determinants of Health   Financial Resource Strain:   . Difficulty of Paying Living Expenses:   Food Insecurity:   . Worried About Charity fundraiser in the Last Year:   . Arboriculturist in the Last Year:   Transportation Needs:   . Film/video editor (Medical):   Marland Kitchen Lack of Transportation (Non-Medical):   Physical Activity:   . Days of Exercise per Week:   . Minutes of Exercise per Session:   Stress:   . Feeling of Stress :   Social Connections:   . Frequency of Communication with Friends and Family:   . Frequency of Social Gatherings with Friends and Family:   . Attends Religious Services:   . Active Member  of Clubs or Organizations:   . Attends Archivist Meetings:   Marland Kitchen Marital Status:   Intimate Partner Violence:   . Fear of Current or Ex-Partner:   . Emotionally Abused:   Marland Kitchen Physically Abused:   . Sexually Abused:     Family History  Problem Relation Age of Onset  . Kidney failure Other        renal disease     Immunization History  Administered Date(s) Administered  . Fluad Quad(high Dose 65+) 04/30/2019  . Influenza Whole 05/15/2007, 06/02/2009  . Influenza, High Dose Seasonal PF 08/13/2017, 06/17/2018  . PFIZER SARS-COV-2 Vaccination 10/02/2019  . Pneumococcal Conjugate-13 03/18/2015  . Pneumococcal Polysaccharide-23 06/25/2012  . Td 07/17/1992    Outpatient Encounter Medications as of 10/24/2019  Medication Sig  . cetirizine (ZYRTEC) 10 MG chewable tablet Chew 10 mg by mouth daily.  . cholecalciferol (VITAMIN D3) 25 MCG (1000 UNIT) tablet Take 1,000 Units by  mouth daily.  . diazepam (VALIUM) 2 MG tablet TAKE 1 TABLET EVERY 8 HOURS AS NEEDED FOR ANXIETY AND TREMOR  . hyoscyamine (LEVSIN) 0.125 MG tablet Take 0.125 mg by mouth every 4 (four) hours as needed for cramping.   Marland Kitchen MYSOLINE 250 MG tablet Take 250 mg by mouth 2 (two) times daily.  Marland Kitchen omeprazole (PRILOSEC) 20 MG capsule TAKE 1 CAPSULE DAILY  . [DISCONTINUED] primidone (MYSOLINE) 50 MG tablet Take 3 tablets in the morning and 2 in the evening.  . [DISCONTINUED] Vitamin D, Ergocalciferol, (DRISDOL) 1.25 MG (50000 UNIT) CAPS capsule Take 1 capsule (50,000 Units total) by mouth every 7 (seven) days.   No facility-administered encounter medications on file as of 10/24/2019.     ROS: Pertinent positives and negatives noted in HPI. Remainder of ROS non-contributory   Allergies  Allergen Reactions  . Memantine Swelling  . Propranolol Other (See Comments)    Made her head feel very funny Made her head feel very funny   . Codeine Nausea And Vomiting  . Sulfamethoxazole Rash    REACTION: unspecified    BP 110/70 (BP Location: Left Arm, Patient Position: Sitting, Cuff Size: Normal)   Pulse 74   Temp (!) 97.5 F (36.4 C) (Temporal)   Ht 5\' 4"  (1.626 m)   Wt 177 lb (80.3 kg)   SpO2 96%   BMI 30.38 kg/m   Physical Exam  Constitutional: She is oriented to person, place, and time. She appears well-developed and well-nourished. No distress.  Cardiovascular: Normal rate, regular rhythm and intact distal pulses.  Pulmonary/Chest: Effort normal and breath sounds normal.  Neurological: She is alert and oriented to person, place, and time.  Skin: Skin is warm and dry.  Psychiatric: She has a normal mood and affect. Her behavior is normal.     A/P:  1. Tremor, essential - stable, chronic Refill: - diazepam (VALIUM) 2 MG tablet; TAKE 1 TABLET EVERY 8 HOURS AS NEEDED FOR ANXIETY AND TREMOR  Dispense: 180 tablet; Refill: 1  2. Anxiety - stable, chronic - diazepam (VALIUM) 2 MG tablet;  TAKE 1 TABLET EVERY 8 HOURS AS NEEDED FOR ANXIETY AND TREMOR  Dispense: 180 tablet; Refill: 1  3. Irritable bowel syndrome, unspecified type - stable, chronic - hyoscyamine (LEVSIN) 0.125 MG tablet; Take 1 tablet (0.125 mg total) by mouth every 4 (four) hours as needed for cramping.  Dispense: 60 tablet; Refill: 1  4. Gastroesophageal reflux disease, unspecified whether esophagitis present - stable, chronic - omeprazole (PRILOSEC) 20 MG capsule; Take 1 capsule (20  mg total) by mouth daily.  Dispense: 90 capsule; Refill: 3  5. Seasonal allergies - nasal saline spray 3x/day - cont with zyrtec 1 tab daily - restart daily flonase - f/u PRN  This visit occurred during the SARS-CoV-2 public health emergency.  Safety protocols were in place, including screening questions prior to the visit, additional usage of staff PPE, and extensive cleaning of exam room while observing appropriate contact time as indicated for disinfecting solutions.

## 2019-10-27 ENCOUNTER — Ambulatory Visit: Payer: Medicare Other | Attending: Internal Medicine

## 2019-10-27 DIAGNOSIS — Z23 Encounter for immunization: Secondary | ICD-10-CM

## 2019-10-27 NOTE — Progress Notes (Signed)
   Covid-19 Vaccination [facility]  Name:  Atlee Mcadoo    MRN: [id number] DOB: Mar 13, 1934  10/27/2019  Ms. Haney was observed post Covid-19 immunization for 15 minutes without incident. She was provided with Vaccine Information Sheet and instruction to access the V-Safe system.   Ms. Abila was instructed to call 911 with any severe reactions post vaccine: Marland Kitchen Difficulty breathing  . Swelling of face and throat  . A fast heartbeat  . A bad rash all over body  . Dizziness and weakness   Immunizations Administered    Name Date Dose VIS Date Route   Pfizer COVID-19 Vaccine 10/27/2019  1:45 PM 0.3 mL 06/27/2019 Intramuscular   Manufacturer: [place]   Lot: [id number]   [place]: [id number]

## 2019-11-26 DIAGNOSIS — G4733 Obstructive sleep apnea (adult) (pediatric): Secondary | ICD-10-CM | POA: Diagnosis not present

## 2019-11-26 DIAGNOSIS — G250 Essential tremor: Secondary | ICD-10-CM | POA: Diagnosis not present

## 2019-11-26 DIAGNOSIS — R269 Unspecified abnormalities of gait and mobility: Secondary | ICD-10-CM | POA: Diagnosis not present

## 2019-11-26 DIAGNOSIS — Z9181 History of falling: Secondary | ICD-10-CM | POA: Diagnosis not present

## 2019-12-24 ENCOUNTER — Other Ambulatory Visit: Payer: Self-pay

## 2019-12-24 ENCOUNTER — Ambulatory Visit (INDEPENDENT_AMBULATORY_CARE_PROVIDER_SITE_OTHER): Payer: Medicare Other | Admitting: Family Medicine

## 2019-12-24 ENCOUNTER — Encounter: Payer: Self-pay | Admitting: Family Medicine

## 2019-12-24 VITALS — BP 112/70 | HR 65 | Temp 96.8°F | Ht 64.0 in | Wt 177.4 lb

## 2019-12-24 DIAGNOSIS — H60311 Diffuse otitis externa, right ear: Secondary | ICD-10-CM

## 2019-12-24 MED ORDER — NEOMYCIN-POLYMYXIN-HC 3.5-10000-1 OT SOLN: 4.0000 [drp] | Freq: Three times a day (TID) | OTIC | 0 refills | Status: AC

## 2019-12-24 NOTE — Progress Notes (Signed)
[name] is a 86 y.o. female  Chief Complaint  Patient presents with  . Ear Pain    Pt c/o rt ear pai.  Pt said it feels like her ear is swollen and that she has felt a sharp pain 3 times since it started bothering her.  Pt said that it started after covid injection on 10/27/19    HPI: [name] is a 86 y.o. female complains of Rt ear pain x 2 mo. Pt states it feels like her ear canal is swollen and she has had 3 episodes of brief, sharp pain. She feels the past 2 days she has had "achiness" behind her ear. No change in hearing. No drainage. No fever, chills.  She saw dentist, no dental issues.   Past Medical History:  Diagnosis Date  . Allergy   . Chicken pox   . Essential tremor    of head, sometimes hands.  pt takes primidone to help the tremors-DR. MILLER-NEUROLOGIST IN [place].  PT WAS STARTED ON VALUIM MANY YEARS AGO FOR HER TREMORS--AND CONTINUES TO TAKE.  . Fracture of humerus, proximal, left, closed October 28, 2011   pt has finished physical therapy-limited ROM reaching to her back and unable to lift heavy things with left hand/arm  . GERD (gastroesophageal reflux disease)   . H/O hiatal hernia   . Hx of colonic polyps   . Osteoarthritis    PAIN AND OA BILATERAL KNEES; ALSO HAS ARTHRITIS IN HANDS AND BACK BUT NO BACK PAIN    Past Surgical History:  Procedure Laterality Date  . arthroscopy left knee    . arthroscopy right knee    . bmp  2002  . COLONOSCOPY W/ POLYPECTOMY    . DILATION AND CURETTAGE OF UTERUS    . JOINT REPLACEMENT Left   . RIGHT FOOT SURGERIES X 2    . TOTAL KNEE ARTHROPLASTY  03/13/2012   Procedure: TOTAL KNEE ARTHROPLASTY;  Surgeon: Gearlean Alf, MD;  Location: [facility];  Service: Orthopedics;  Laterality: Left;  steroid injection right knee  . TOTAL KNEE ARTHROPLASTY Right 08/04/2013   Procedure: RIGHT TOTAL KNEE ARTHROPLASTY;  Surgeon: Gearlean Alf, MD;  Location: [facility];  Service: Orthopedics;  Laterality: Right;     Social  History   Socioeconomic History  . Marital status: Widowed    Spouse name: Not on file  . Number of children: Not on file  . Years of education: Not on file  . Highest education level: Not on file  Occupational History  . Not on file  Tobacco Use  . Smoking status: Never Smoker  . Smokeless tobacco: Never Used  Substance and Sexual Activity  . Alcohol use: Yes    Comment: ONE GLASS WINE DAILY  . Drug use: No  . Sexual activity: Not Currently  Other Topics Concern  . Not on file  Social History Narrative  . Not on file   Social Determinants of Health   Financial Resource Strain:   . Difficulty of Paying Living Expenses:   Food Insecurity:   . Worried About Charity fundraiser in the Last Year:   . Arboriculturist in the Last Year:   Transportation Needs:   . Film/video editor (Medical):   Marland Kitchen Lack of Transportation (Non-Medical):   Physical Activity:   . Days of Exercise per Week:   . Minutes of Exercise per Session:   Stress:   . Feeling of Stress :   Social  Connections:   . Frequency of Communication with Friends and Family:   . Frequency of Social Gatherings with Friends and Family:   . Attends Religious Services:   . Active Member of Clubs or Organizations:   . Attends Archivist Meetings:   Marland Kitchen Marital Status:   Intimate Partner Violence:   . Fear of Current or Ex-Partner:   . Emotionally Abused:   Marland Kitchen Physically Abused:   . Sexually Abused:     Family History  Problem Relation Age of Onset  . Kidney failure Other        renal disease     Immunization History  Administered Date(s) Administered  . Fluad Quad(high Dose 65+) 04/30/2019  . Influenza Whole 05/15/2007, 06/02/2009  . Influenza, High Dose Seasonal PF 08/13/2017, 06/17/2018  . PFIZER SARS-COV-2 Vaccination 10/02/2019, 10/27/2019  . Pneumococcal Conjugate-13 03/18/2015  . Pneumococcal Polysaccharide-23 06/25/2012  . Td 07/17/1992    Outpatient Encounter Medications as of  12/24/2019  Medication Sig  . cetirizine (ZYRTEC) 10 MG chewable tablet Chew 10 mg by mouth daily.  . cholecalciferol (VITAMIN D3) 25 MCG (1000 UNIT) tablet Take 1,000 Units by mouth daily.  . diazepam (VALIUM) 2 MG tablet TAKE 1 TABLET EVERY 8 HOURS AS NEEDED FOR ANXIETY AND TREMOR  . hyoscyamine (LEVSIN) 0.125 MG tablet Take 1 tablet (0.125 mg total) by mouth every 4 (four) hours as needed for cramping.  Marland Kitchen MYSOLINE 250 MG tablet Take 250 mg by mouth 2 (two) times daily.  Marland Kitchen omeprazole (PRILOSEC) 20 MG capsule Take 1 capsule (20 mg total) by mouth daily.  . memantine (NAMENDA) 10 MG tablet 1/2 daily for a week, then 1/2 BID for a week, then 1/2 in AM and 1 in PM for a week, then 1 BID   No facility-administered encounter medications on file as of 12/24/2019.     ROS: Pertinent positives and negatives noted in HPI. Remainder of ROS non-contributory   Allergies  Allergen Reactions  . Memantine Swelling  . Propranolol Other (See Comments)    Made her head feel very funny Made her head feel very funny   . Codeine Nausea And Vomiting  . Sulfamethoxazole Rash    REACTION: unspecified    BP 112/70 (BP Location: Left Arm, Patient Position: Sitting, Cuff Size: Normal)   Pulse 65   Temp (!) 96.8 F (36 C) (Temporal)   Ht 5\' 4"  (1.626 m)   Wt 177 lb 6.4 oz (80.5 kg)   SpO2 96%   BMI 30.45 kg/m   Physical Exam  Constitutional: She is oriented to person, place, and time. She appears well-developed and well-nourished. No distress.  HENT:  Right Ear: Tympanic membrane normal. There is swelling and tenderness. No drainage. No mastoid tenderness. No decreased hearing is noted.  Left Ear: Tympanic membrane, external ear and ear canal normal.  Rt ear: + TTP over tragus, canal with moderate erythema and edema  Lymphadenopathy:    She has no cervical adenopathy.  Neurological: She is alert and oriented to person, place, and time.  Psychiatric: She has a normal mood and affect. Her behavior is  normal.     A/P:  1. Acute diffuse otitis externa of right ear Rx: - neomycin-polymyxin-hydrocortisone (CORTISPORIN) OTIC solution; Place 4 drops into the right ear 3 (three) times daily for 10 days.  Dispense: 10 mL; Refill: 0 - f/u PRN Discussed plan and reviewed medications with patient, including risks, benefits, and potential side effects. Pt expressed understand. All questions answered.  This visit occurred during the SARS-CoV-2 public health emergency.  Safety protocols were in place, including screening questions prior to the visit, additional usage of staff PPE, and extensive cleaning of exam room while observing appropriate contact time as indicated for disinfecting solutions.

## 2020-01-07 ENCOUNTER — Other Ambulatory Visit: Payer: Self-pay

## 2020-01-08 ENCOUNTER — Encounter: Payer: Self-pay | Admitting: Family Medicine

## 2020-01-08 ENCOUNTER — Ambulatory Visit (INDEPENDENT_AMBULATORY_CARE_PROVIDER_SITE_OTHER): Payer: Medicare Other | Admitting: Family Medicine

## 2020-01-08 VITALS — BP 140/80 | HR 62 | Temp 96.8°F | Ht 64.0 in | Wt 176.6 lb

## 2020-01-08 DIAGNOSIS — H6691 Otitis media, unspecified, right ear: Secondary | ICD-10-CM | POA: Diagnosis not present

## 2020-01-08 DIAGNOSIS — H9201 Otalgia, right ear: Secondary | ICD-10-CM | POA: Diagnosis not present

## 2020-01-08 MED ORDER — AMOXICILLIN-POT CLAVULANATE 875-125 MG PO TABS: 1.0000 | ORAL_TABLET | Freq: Two times a day (BID) | ORAL | 0 refills | Status: DC

## 2020-01-08 NOTE — Progress Notes (Signed)
[name] is a 86 y.o. female  Chief Complaint  Patient presents with  . Ear Pain    Pt is still having ear pain, pt said that she feels pain in  the back of her head like a headache.  Pt explains that she feels like her gland is swollen on the same side and that she feels a sharp pain run through her ear when she bites into something cold.        HPI: [name] is a 86 y.o. female who was seen by me about 2 weeks ago and dx with otitis externa of the Rt ear and treated with abx gtts. She feels those symptoms have improved but now she has different Rt ear symptoms.  Today pt states she has Rt ear pain that is sharp, brief at times, comes and goes.  Rt posterior headache. No sore throat, runny nose, PND.   Past Medical History:  Diagnosis Date  . Allergy   . Chicken pox   . Essential tremor    of head, sometimes hands.  pt takes primidone to help the tremors-DR. MILLER-NEUROLOGIST IN [place].  PT WAS STARTED ON VALUIM MANY YEARS AGO FOR HER TREMORS--AND CONTINUES TO TAKE.  . Fracture of humerus, proximal, left, closed October 28, 2011   pt has finished physical therapy-limited ROM reaching to her back and unable to lift heavy things with left hand/arm  . GERD (gastroesophageal reflux disease)   . H/O hiatal hernia   . Hx of colonic polyps   . Osteoarthritis    PAIN AND OA BILATERAL KNEES; ALSO HAS ARTHRITIS IN HANDS AND BACK BUT NO BACK PAIN    Past Surgical History:  Procedure Laterality Date  . arthroscopy left knee    . arthroscopy right knee    . bmp  2002  . COLONOSCOPY W/ POLYPECTOMY    . DILATION AND CURETTAGE OF UTERUS    . JOINT REPLACEMENT Left   . RIGHT FOOT SURGERIES X 2    . TOTAL KNEE ARTHROPLASTY  03/13/2012   Procedure: TOTAL KNEE ARTHROPLASTY;  Surgeon: Gearlean Alf, MD;  Location: [facility];  Service: Orthopedics;  Laterality: Left;  steroid injection right knee  . TOTAL KNEE ARTHROPLASTY Right 08/04/2013   Procedure: RIGHT TOTAL KNEE ARTHROPLASTY;   Surgeon: Gearlean Alf, MD;  Location: [facility];  Service: Orthopedics;  Laterality: Right;    Social History   Socioeconomic History  . Marital status: Widowed    Spouse name: Not on file  . Number of children: Not on file  . Years of education: Not on file  . Highest education level: Not on file  Occupational History  . Not on file  Tobacco Use  . Smoking status: Never Smoker  . Smokeless tobacco: Never Used  Substance and Sexual Activity  . Alcohol use: Yes    Comment: ONE GLASS WINE DAILY  . Drug use: No  . Sexual activity: Not Currently  Other Topics Concern  . Not on file  Social History Narrative  . Not on file   Social Determinants of Health   Financial Resource Strain:   . Difficulty of Paying Living Expenses:   Food Insecurity:   . Worried About Charity fundraiser in the Last Year:   . Arboriculturist in the Last Year:   Transportation Needs:   . Film/video editor (Medical):   Marland Kitchen Lack of Transportation (Non-Medical):   Physical Activity:   . Days of  Exercise per Week:   . Minutes of Exercise per Session:   Stress:   . Feeling of Stress :   Social Connections:   . Frequency of Communication with Friends and Family:   . Frequency of Social Gatherings with Friends and Family:   . Attends Religious Services:   . Active Member of Clubs or Organizations:   . Attends Archivist Meetings:   Marland Kitchen Marital Status:   Intimate Partner Violence:   . Fear of Current or Ex-Partner:   . Emotionally Abused:   Marland Kitchen Physically Abused:   . Sexually Abused:     Family History  Problem Relation Age of Onset  . Kidney failure Other        renal disease     Immunization History  Administered Date(s) Administered  . Fluad Quad(high Dose 65+) 04/30/2019  . Influenza Whole 05/15/2007, 06/02/2009  . Influenza, High Dose Seasonal PF 08/13/2017, 06/17/2018  . PFIZER SARS-COV-2 Vaccination 10/02/2019, 10/27/2019  . Pneumococcal Conjugate-13 03/18/2015  .  Pneumococcal Polysaccharide-23 06/25/2012  . Td 07/17/1992    Outpatient Encounter Medications as of 01/08/2020  Medication Sig  . cetirizine (ZYRTEC) 10 MG chewable tablet Chew 10 mg by mouth daily.  . cholecalciferol (VITAMIN D3) 25 MCG (1000 UNIT) tablet Take 1,000 Units by mouth daily.  . diazepam (VALIUM) 2 MG tablet TAKE 1 TABLET EVERY 8 HOURS AS NEEDED FOR ANXIETY AND TREMOR  . hyoscyamine (LEVSIN) 0.125 MG tablet Take 1 tablet (0.125 mg total) by mouth every 4 (four) hours as needed for cramping.  . memantine (NAMENDA) 10 MG tablet 1/2 daily for a week, then 1/2 BID for a week, then 1/2 in AM and 1 in PM for a week, then 1 BID  . MYSOLINE 250 MG tablet Take 250 mg by mouth 2 (two) times daily.  Marland Kitchen omeprazole (PRILOSEC) 20 MG capsule Take 1 capsule (20 mg total) by mouth daily.  Marland Kitchen amoxicillin-clavulanate (AUGMENTIN) 875-125 MG tablet Take 1 tablet by mouth 2 (two) times daily.   No facility-administered encounter medications on file as of 01/08/2020.     ROS: Pertinent positives and negatives noted in HPI. Remainder of ROS non-contributory    Allergies  Allergen Reactions  . Memantine Swelling  . Propranolol Other (See Comments)    Made her head feel very funny Made her head feel very funny   . Codeine Nausea And Vomiting  . Sulfamethoxazole Rash    REACTION: unspecified    BP 140/80 (BP Location: Left Arm, Patient Position: Sitting, Cuff Size: Normal)   Pulse 62   Temp (!) 96.8 F (36 C) (Temporal)   Ht 5\' 4"  (1.626 m)   Wt 176 lb 9.6 oz (80.1 kg)   SpO2 99%   BMI 30.31 kg/m   Physical Exam Constitutional:      General: She is not in acute distress.    Appearance: Normal appearance. She is not toxic-appearing.  HENT:     Right Ear: Hearing, ear canal and external ear normal. No middle ear effusion. There is no impacted cerumen. No mastoid tenderness. Tympanic membrane is erythematous.     Left Ear: Hearing, tympanic membrane, ear canal and external ear normal.   Musculoskeletal:     Cervical back: Neck supple.  Lymphadenopathy:     Cervical: No cervical adenopathy.  Neurological:     Mental Status: She is alert and oriented to person, place, and time.  Psychiatric:        Mood and Affect: Mood normal.  Behavior: Behavior normal.      A/P:  1. Right acute otitis media 2. Right ear pain - treated with AOE 2 wks ago and symptoms improved but now new/different syptoms Rx: - amoxicillin-clavulanate (AUGMENTIN) 875-125 MG tablet; Take 1 tablet by mouth 2 (two) times daily.  Dispense: 20 tablet; Refill: 0 - If no improvement, pt will have appt scheduled with ENT - Ambulatory referral to ENT Discussed plan and reviewed medications with patient, including risks, benefits, and potential side effects. Pt expressed understand. All questions answered.     This visit occurred during the SARS-CoV-2 public health emergency.  Safety protocols were in place, including screening questions prior to the visit, additional usage of staff PPE, and extensive cleaning of exam room while observing appropriate contact time as indicated for disinfecting solutions.

## 2020-01-26 ENCOUNTER — Ambulatory Visit (INDEPENDENT_AMBULATORY_CARE_PROVIDER_SITE_OTHER): Payer: Medicare Other | Admitting: Otolaryngology

## 2020-01-26 ENCOUNTER — Encounter (INDEPENDENT_AMBULATORY_CARE_PROVIDER_SITE_OTHER): Payer: Self-pay | Admitting: Otolaryngology

## 2020-01-26 ENCOUNTER — Other Ambulatory Visit: Payer: Self-pay

## 2020-01-26 VITALS — Temp 97.3°F

## 2020-01-26 DIAGNOSIS — H9201 Otalgia, right ear: Secondary | ICD-10-CM

## 2020-01-26 NOTE — Progress Notes (Signed)
HPI: [name] is a 86 y.o. female who presents is referred by her PCP for evaluation of intermittent right ear pain.  Apparently the pain in the right ear began shortly after her second vaccine this past April.  She describes pain going down into the jaw as well as deep sharp pain in the ear.  She has not really noted any hearing problems.  But she feels like the pain shoots deep into her ear at times.  She has been treated with antibiotic eardrops as well as amoxicillin but still has the intermittent pain.. Patient has a chronic tremor that she sees neurology for.  Past Medical History:  Diagnosis Date  . Allergy   . Chicken pox   . Essential tremor    of head, sometimes hands.  pt takes primidone to help the tremors-DR. MILLER-NEUROLOGIST IN [place].  PT WAS STARTED ON VALUIM MANY YEARS AGO FOR HER TREMORS--AND CONTINUES TO TAKE.  . Fracture of humerus, proximal, left, closed October 28, 2011   pt has finished physical therapy-limited ROM reaching to her back and unable to lift heavy things with left hand/arm  . GERD (gastroesophageal reflux disease)   . H/O hiatal hernia   . Hx of colonic polyps   . Osteoarthritis    PAIN AND OA BILATERAL KNEES; ALSO HAS ARTHRITIS IN HANDS AND BACK BUT NO BACK PAIN   Past Surgical History:  Procedure Laterality Date  . arthroscopy left knee    . arthroscopy right knee    . bmp  2002  . COLONOSCOPY W/ POLYPECTOMY    . DILATION AND CURETTAGE OF UTERUS    . JOINT REPLACEMENT Left   . RIGHT FOOT SURGERIES X 2    . TOTAL KNEE ARTHROPLASTY  03/13/2012   Procedure: TOTAL KNEE ARTHROPLASTY;  Surgeon: Gearlean Alf, MD;  Location: [facility];  Service: Orthopedics;  Laterality: Left;  steroid injection right knee  . TOTAL KNEE ARTHROPLASTY Right 08/04/2013   Procedure: RIGHT TOTAL KNEE ARTHROPLASTY;  Surgeon: Gearlean Alf, MD;  Location: [facility];  Service: Orthopedics;  Laterality: Right;   Social History   Socioeconomic History  . Marital status:  Widowed    Spouse name: Not on file  . Number of children: Not on file  . Years of education: Not on file  . Highest education level: Not on file  Occupational History  . Not on file  Tobacco Use  . Smoking status: Never Smoker  . Smokeless tobacco: Never Used  Substance and Sexual Activity  . Alcohol use: Yes    Comment: ONE GLASS WINE DAILY  . Drug use: No  . Sexual activity: Not Currently  Other Topics Concern  . Not on file  Social History Narrative  . Not on file   Social Determinants of Health   Financial Resource Strain:   . Difficulty of Paying Living Expenses:   Food Insecurity:   . Worried About Charity fundraiser in the Last Year:   . Arboriculturist in the Last Year:   Transportation Needs:   . Film/video editor (Medical):   Marland Kitchen Lack of Transportation (Non-Medical):   Physical Activity:   . Days of Exercise per Week:   . Minutes of Exercise per Session:   Stress:   . Feeling of Stress :   Social Connections:   . Frequency of Communication with Friends and Family:   . Frequency of Social Gatherings with Friends and Family:   . Attends Religious  Services:   . Active Member of Clubs or Organizations:   . Attends Archivist Meetings:   Marland Kitchen Marital Status:    Family History  Problem Relation Age of Onset  . Kidney failure Other        renal disease   Allergies  Allergen Reactions  . Memantine Swelling  . Propranolol Other (See Comments)    Made her head feel very funny Made her head feel very funny   . Codeine Nausea And Vomiting  . Sulfamethoxazole Rash    REACTION: unspecified   Prior to Admission medications   Medication Sig Start Date End Date Taking? Authorizing Provider  amoxicillin-clavulanate (AUGMENTIN) 875-125 MG tablet Take 1 tablet by mouth 2 (two) times daily. Patient not taking: Reported on 01/26/2020 01/08/20   Ronnald Nian, DO  cetirizine (ZYRTEC) 10 MG chewable tablet Chew 10 mg by mouth daily.    [provider]  cholecalciferol (VITAMIN D3) 25 MCG (1000 UNIT) tablet Take 1,000 Units by mouth daily.    [provider]  diazepam (VALIUM) 2 MG tablet TAKE 1 TABLET EVERY 8 HOURS AS NEEDED FOR ANXIETY AND TREMOR 10/24/19   Cirigliano, Garvin Fila, DO  hyoscyamine (LEVSIN) 0.125 MG tablet Take 1 tablet (0.125 mg total) by mouth every 4 (four) hours as needed for cramping. 10/24/19   Cirigliano, Garvin Fila, DO  memantine (NAMENDA) 10 MG tablet 1/2 daily for a week, then 1/2 BID for a week, then 1/2 in AM and 1 in PM for a week, then 1 BID 11/26/19 11/25/20  [provider]  MYSOLINE 250 MG tablet Take 250 mg by mouth 2 (two) times daily. 08/27/19   [provider]  omeprazole (PRILOSEC) 20 MG capsule Take 1 capsule (20 mg total) by mouth daily. 10/24/19   Cirigliano, Garvin Fila, DO     Positive ROS: Otherwise negative  All other systems have been reviewed and were otherwise negative with the exception of those mentioned in the HPI and as above.  Physical Exam: Constitutional: Alert, well-appearing, no acute distress Ears: External ears without lesions or tenderness. Ear canals are clear bilaterally.  TMs are clear bilaterally with good mobility on pneumatic otoscopy.  Likewise ear canals are clear with no evidence of external otitis.  Hearing screen with a 512 1024 tuning fork she hears the same in both ears with good hearing in both ears and AC > BC bilaterally. Nasal: External nose without lesions.. Clear nasal passages bilaterally. Oral: Lips and gums without lesions. Tongue and palate mucosa without lesions. Posterior oropharynx clear.  Indirect laryngoscopy revealed a clear base of tongue, vallecula and epiglottis.  Vocal cords were clear.  Palpation of the base of tongue and tonsil region was benign except for strong gag reflex. Neck: No palpable adenopathy or masses.  When palpating below the angle of the jaw she describes some slight discomfort in this region but there is no palpable  masses or adenopathy in this area.  No erythema.  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  But most of her discomfort seems inferior to this. Respiratory: Breathing comfortably  Skin: No facial/neck lesions or rash noted.  Procedures  Assessment: Right ear pain questionable etiology perhaps may be related to TMJ dysfunction. No evidence of ear infection or ear canal infection causing ear pain.  Plan: I suspect the ear pain is probably referred pain possibly from TMJ versus other neuropathy. Recommended soft diet for  the next 10 days and use of NSAIDs such as Advil or Aleve on a regular basis for the next week to see if this helps at all with the pain.   Radene Journey, MD   CC:

## 2020-02-18 ENCOUNTER — Other Ambulatory Visit: Payer: Self-pay

## 2020-02-19 ENCOUNTER — Ambulatory Visit (INDEPENDENT_AMBULATORY_CARE_PROVIDER_SITE_OTHER): Payer: Medicare Other | Admitting: Family Medicine

## 2020-02-19 ENCOUNTER — Encounter: Payer: Self-pay | Admitting: Family Medicine

## 2020-02-19 VITALS — BP 155/82 | HR 74 | Temp 96.8°F | Ht 64.0 in | Wt 178.2 lb

## 2020-02-19 DIAGNOSIS — S29011A Strain of muscle and tendon of front wall of thorax, initial encounter: Secondary | ICD-10-CM

## 2020-02-19 DIAGNOSIS — F419 Anxiety disorder, unspecified: Secondary | ICD-10-CM

## 2020-02-19 DIAGNOSIS — G250 Essential tremor: Secondary | ICD-10-CM

## 2020-02-19 MED ORDER — TIZANIDINE HCL 4 MG PO CAPS: 4.0000 mg | ORAL_CAPSULE | Freq: Every evening | ORAL | 0 refills | Status: DC | PRN

## 2020-02-19 MED ORDER — NAPROXEN 500 MG PO TABS: 500.0000 mg | ORAL_TABLET | Freq: Two times a day (BID) | ORAL | 0 refills | Status: DC

## 2020-02-19 MED ORDER — DIAZEPAM 2 MG PO TABS: ORAL_TABLET | ORAL | 1 refills | Status: DC

## 2020-02-19 NOTE — Progress Notes (Signed)
[name] is a 86 y.o. female  Chief Complaint  Patient presents with  . Acute Visit    Pt c/o lt side pain x 2 weeks.  Pt said has worsened in the past week or so.  Pt travels from front to back.    HPI: [name] is a 86 y.o. female who complains of pain in her Lt side that has been present x 2 weeks, but is worse in the last week or so. She describes the pain as sharp when she presses on the area, moves, walks, coughs. No pain at rest. No SOB. No cough.  No injury or fall.  No fever, chills, body aches.   She has not done anything or taken anything for above symptoms.   She also requests a refill of her valium which she takes PRN for anxiety and tremor.   Past Medical History:  Diagnosis Date  . Allergy   . Chicken pox   . Essential tremor    of head, sometimes hands.  pt takes primidone to help the tremors-DR. MILLER-NEUROLOGIST IN [place].  PT WAS STARTED ON VALUIM MANY YEARS AGO FOR HER TREMORS--AND CONTINUES TO TAKE.  . Fracture of humerus, proximal, left, closed October 28, 2011   pt has finished physical therapy-limited ROM reaching to her back and unable to lift heavy things with left hand/arm  . GERD (gastroesophageal reflux disease)   . H/O hiatal hernia   . Hx of colonic polyps   . Osteoarthritis    PAIN AND OA BILATERAL KNEES; ALSO HAS ARTHRITIS IN HANDS AND BACK BUT NO BACK PAIN    Past Surgical History:  Procedure Laterality Date  . arthroscopy left knee    . arthroscopy right knee    . bmp  2002  . COLONOSCOPY W/ POLYPECTOMY    . DILATION AND CURETTAGE OF UTERUS    . JOINT REPLACEMENT Left   . RIGHT FOOT SURGERIES X 2    . TOTAL KNEE ARTHROPLASTY  03/13/2012   Procedure: TOTAL KNEE ARTHROPLASTY;  Surgeon: Gearlean Alf, MD;  Location: [facility];  Service: Orthopedics;  Laterality: Left;  steroid injection right knee  . TOTAL KNEE ARTHROPLASTY Right 08/04/2013   Procedure: RIGHT TOTAL KNEE ARTHROPLASTY;  Surgeon: Gearlean Alf, MD;  Location:  [facility];  Service: Orthopedics;  Laterality: Right;    Social History   Socioeconomic History  . Marital status: Widowed    Spouse name: Not on file  . Number of children: Not on file  . Years of education: Not on file  . Highest education level: Not on file  Occupational History  . Not on file  Tobacco Use  . Smoking status: Never Smoker  . Smokeless tobacco: Never Used  Substance and Sexual Activity  . Alcohol use: Yes    Comment: ONE GLASS WINE DAILY  . Drug use: No  . Sexual activity: Not Currently  Other Topics Concern  . Not on file  Social History Narrative  . Not on file   Social Determinants of Health   Financial Resource Strain:   . Difficulty of Paying Living Expenses:   Food Insecurity:   . Worried About Charity fundraiser in the Last Year:   . Arboriculturist in the Last Year:   Transportation Needs:   . Film/video editor (Medical):   Marland Kitchen Lack of Transportation (Non-Medical):   Physical Activity:   . Days of Exercise per Week:   . Minutes of  Exercise per Session:   Stress:   . Feeling of Stress :   Social Connections:   . Frequency of Communication with Friends and Family:   . Frequency of Social Gatherings with Friends and Family:   . Attends Religious Services:   . Active Member of Clubs or Organizations:   . Attends Archivist Meetings:   Marland Kitchen Marital Status:   Intimate Partner Violence:   . Fear of Current or Ex-Partner:   . Emotionally Abused:   Marland Kitchen Physically Abused:   . Sexually Abused:     Family History  Problem Relation Age of Onset  . Kidney failure Other        renal disease     Immunization History  Administered Date(s) Administered  . Fluad Quad(high Dose 65+) 04/30/2019  . Influenza Whole 05/15/2007, 06/02/2009  . Influenza, High Dose Seasonal PF 08/13/2017, 06/17/2018  . PFIZER SARS-COV-2 Vaccination 10/02/2019, 10/27/2019  . Pneumococcal Conjugate-13 03/18/2015  . Pneumococcal Polysaccharide-23 06/25/2012  . Td  07/17/1992    Outpatient Encounter Medications as of 02/19/2020  Medication Sig  . amoxicillin-clavulanate (AUGMENTIN) 875-125 MG tablet Take 1 tablet by mouth 2 (two) times daily.  . cetirizine (ZYRTEC) 10 MG chewable tablet Chew 10 mg by mouth daily.  . cholecalciferol (VITAMIN D3) 25 MCG (1000 UNIT) tablet Take 1,000 Units by mouth daily.  . diazepam (VALIUM) 2 MG tablet TAKE 1 TABLET EVERY 8 HOURS AS NEEDED FOR ANXIETY AND TREMOR  . hyoscyamine (LEVSIN) 0.125 MG tablet Take 1 tablet (0.125 mg total) by mouth every 4 (four) hours as needed for cramping.  . memantine (NAMENDA) 10 MG tablet 1/2 daily for a week, then 1/2 BID for a week, then 1/2 in AM and 1 in PM for a week, then 1 BID  . MYSOLINE 250 MG tablet Take 250 mg by mouth 2 (two) times daily.  Marland Kitchen omeprazole (PRILOSEC) 20 MG capsule Take 1 capsule (20 mg total) by mouth daily.   No facility-administered encounter medications on file as of 02/19/2020.     ROS: Pertinent positives and negatives noted in HPI. Remainder of ROS non-contributory   Allergies  Allergen Reactions  . Memantine Swelling  . Propranolol Other (See Comments)    Made her head feel very funny Made her head feel very funny   . Codeine Nausea And Vomiting  . Sulfamethoxazole Rash    REACTION: unspecified    BP (!) 155/82 (BP Location: Right Arm, Patient Position: Sitting, Cuff Size: Normal)   Pulse 74   Temp (!) 96.8 F (36 C) (Temporal)   Ht 5\' 4"  (1.626 m)   Wt 178 lb 3.2 oz (80.8 kg)   SpO2 96%   BMI 30.59 kg/m    BP Readings from Last 3 Encounters:  02/19/20 (!) 155/82  01/08/20 140/80  12/24/19 112/70   Pulse Readings from Last 3 Encounters:  02/19/20 74  01/08/20 62  12/24/19 65   Wt Readings from Last 3 Encounters:  02/19/20 178 lb 3.2 oz (80.8 kg)  01/08/20 176 lb 9.6 oz (80.1 kg)  12/24/19 177 lb 6.4 oz (80.5 kg)    Physical Exam Constitutional:      General: She is not in acute distress.    Appearance: She is not  ill-appearing or toxic-appearing.  Cardiovascular:     Rate and Rhythm: Normal rate and regular rhythm.  Pulmonary:     Effort: Pulmonary effort is normal. No respiratory distress.     Breath sounds: Normal breath sounds. No  wheezing, rhonchi or rales.  Chest:     Chest wall: Tenderness (Lt lateral chest wall TTP over intercostal musclesof ribs 7-9) present.  Skin:    General: Skin is warm and dry.     Findings: No bruising or rash.  Neurological:     Mental Status: She is alert and oriented to person, place, and time. Mental status is at baseline.  Psychiatric:        Mood and Affect: Mood normal.        Behavior: Behavior normal.      A/P:  1. Tremor, essential 2. Anxiety - pt has been taking TID x years - database reviewed and appropriate Refill: - diazepam (VALIUM) 2 MG tablet; TAKE 1 TABLET EVERY 8 HOURS AS NEEDED FOR ANXIETY AND TREMOR  Dispense: 180 tablet; Refill: 1 - f/u in 3 mo or sooner PRN  4. Intercostal muscle strain, initial encounter - heat TID followed by ROM/stretching exercises Rx: - tiZANidine (ZANAFLEX) 4 MG capsule; Take 1 capsule (4 mg total) by mouth at bedtime as needed for muscle spasms.  Dispense: 20 capsule; Refill: 0 - pt to take at bedtime x 3-4 nights, aware it may make her sleepy and she will not take w/ valium Rx: - naproxen (NAPROSYN) 500 MG tablet; Take 1 tablet (500 mg total) by mouth 2 (two) times daily with a meal.  Dispense: 30 tablet; Refill: 0 - pt to take x 5-7 days then stop - f/u if symptoms worsen or do not improve in 2 wks Discussed plan and reviewed medications with patient, including risks, benefits, and potential side effects. Pt expressed understand. All questions answered.   This visit occurred during the SARS-CoV-2 public health emergency.  Safety protocols were in place, including screening questions prior to the visit, additional usage of staff PPE, and extensive cleaning of exam room while observing appropriate contact time  as indicated for disinfecting solutions.

## 2020-02-19 NOTE — Patient Instructions (Addendum)
Use heating pad 3x/day - 15-20 min on then off After done with heat, stretching/range of motion exercises Take naproxen 500mg  (anti-inflammatory) 1 tab twice per day with food x 5 days Take 1 4mg  tab of zanaflex (muscle relaxant) at bedtime x 3-4 nights

## 2020-02-25 ENCOUNTER — Telehealth: Payer: Self-pay | Admitting: Family Medicine

## 2020-02-25 NOTE — Telephone Encounter (Signed)
Left message for patient to schedule Annual Wellness Visit.  Please schedule with Nurse Health Advisor Martha Stanley, RN at [facility]  °

## 2020-03-08 ENCOUNTER — Telehealth: Payer: Self-pay

## 2020-03-08 NOTE — Telephone Encounter (Signed)
Attempted to reach patient to reschedule 05/05/20 appt for Medicare Wellness. Nurse health Advisor's schedule changing to Tuedays & Thursday at [facility]. Need to reschedule for one of those days.

## 2020-03-09 NOTE — Telephone Encounter (Signed)
See routing comments in telephone encounter

## 2020-03-31 DIAGNOSIS — Z9181 History of falling: Secondary | ICD-10-CM | POA: Diagnosis not present

## 2020-03-31 DIAGNOSIS — G4733 Obstructive sleep apnea (adult) (pediatric): Secondary | ICD-10-CM | POA: Diagnosis not present

## 2020-03-31 DIAGNOSIS — G250 Essential tremor: Secondary | ICD-10-CM | POA: Diagnosis not present

## 2020-03-31 DIAGNOSIS — R269 Unspecified abnormalities of gait and mobility: Secondary | ICD-10-CM | POA: Diagnosis not present

## 2020-04-20 ENCOUNTER — Ambulatory Visit (INDEPENDENT_AMBULATORY_CARE_PROVIDER_SITE_OTHER): Payer: Medicare Other

## 2020-04-20 VITALS — Ht 64.0 in | Wt 178.0 lb

## 2020-04-20 DIAGNOSIS — Z0000 Encounter for general adult medical examination without abnormal findings: Secondary | ICD-10-CM | POA: Diagnosis not present

## 2020-04-20 NOTE — Patient Instructions (Signed)
[name] , Thank you for taking time to complete your Medicare Wellness Visit. I appreciate your ongoing commitment to your health goals. Please review the following plan we discussed and let me know if I can assist you in the future.   Screening recommendations/referrals: Colonoscopy: No longer indicated Mammogram:Declined Bone Density: Declined-Please call the office to schedule if you change your mind Recommended yearly ophthalmology/optometry visit for glaucoma screening and checkup Recommended yearly dental visit for hygiene and checkup  Vaccinations: Influenza vaccine: Due Pneumococcal vaccine: Completed vaccines Tdap vaccine: Discuss with pharmacy Shingles vaccine: Discuss with pharmacy  Covid-19:Completed vaccines  Advanced directives: Copy on file  Conditions/risks identified: See problem list  Next appointment: Follow up in one year for your annual wellness visit 04/26/2021 @ 11:15am   Preventive Care 65 Years and Older, Female Preventive care refers to lifestyle choices and visits with your health care provider that can promote health and wellness. What does preventive care include?  A yearly physical exam. This is also called an annual well check.  Dental exams once or twice a year.  Routine eye exams. Ask your health care provider how often you should have your eyes checked.  Personal lifestyle choices, including:  Daily care of your teeth and gums.  Regular physical activity.  Eating a healthy diet.  Avoiding tobacco and drug use.  Limiting alcohol use.  Practicing safe sex.  Taking low-dose aspirin every day.  Taking vitamin and mineral supplements as recommended by your health care provider. What happens during an annual well check? The services and screenings done by your health care provider during your annual well check will depend on your age, overall health, lifestyle risk factors, and family history of disease. Counseling  Your health  care provider may ask you questions about your:  Alcohol use.  Tobacco use.  Drug use.  Emotional well-being.  Home and relationship well-being.  Sexual activity.  Eating habits.  History of falls.  Memory and ability to understand (cognition).  Work and work Statistician.  Reproductive health. Screening  You may have the following tests or measurements:  Height, weight, and BMI.  Blood pressure.  Lipid and cholesterol levels. These may be checked every 5 years, or more frequently if you are over 74 years old.  Skin check.  Lung cancer screening. You may have this screening every year starting at age 60 if you have a 30-pack-year history of smoking and currently smoke or have quit within the past 15 years.  Fecal occult blood test (FOBT) of the stool. You may have this test every year starting at age 81.  Flexible sigmoidoscopy or colonoscopy. You may have a sigmoidoscopy every 5 years or a colonoscopy every 10 years starting at age 106.  Hepatitis C blood test.  Hepatitis B blood test.  Sexually transmitted disease (STD) testing.  Diabetes screening. This is done by checking your blood sugar (glucose) after you have not eaten for a while (fasting). You may have this done every 1-3 years.  Bone density scan. This is done to screen for osteoporosis. You may have this done starting at age 84.  Mammogram. This may be done every 1-2 years. Talk to your health care provider about how often you should have regular mammograms. Talk with your health care provider about your test results, treatment options, and if necessary, the need for more tests. Vaccines  Your health care provider may recommend certain vaccines, such as:  Influenza vaccine. This is recommended every year.  Tetanus, diphtheria,  and acellular pertussis (Tdap, Td) vaccine. You may need a Td booster every 10 years.  Zoster vaccine. You may need this after age 44.  Pneumococcal 13-valent conjugate  (PCV13) vaccine. One dose is recommended after age 19.  Pneumococcal polysaccharide (PPSV23) vaccine. One dose is recommended after age 75. Talk to your health care provider about which screenings and vaccines you need and how often you need them. This information is not intended to replace advice given to you by your health care provider. Make sure you discuss any questions you have with your health care provider. Document Released: 07/30/2015 Document Revised: 03/22/2016 Document Reviewed: 05/04/2015 Elsevier Interactive Patient Education  2017 [place] Prevention in the Home Falls can cause injuries. They can happen to people of all ages. There are many things you can do to make your home safe and to help prevent falls. What can I do on the outside of my home?  Regularly fix the edges of walkways and driveways and fix any cracks.  Remove anything that might make you trip as you walk through a door, such as a raised step or threshold.  Trim any bushes or trees on the path to your home.  Use bright outdoor lighting.  Clear any walking paths of anything that might make someone trip, such as rocks or tools.  Regularly check to see if handrails are loose or broken. Make sure that both sides of any steps have handrails.  Any raised decks and porches should have guardrails on the edges.  Have any leaves, snow, or ice cleared regularly.  Use sand or salt on walking paths during winter.  Clean up any spills in your garage right away. This includes oil or grease spills. What can I do in the bathroom?  Use night lights.  Install grab bars by the toilet and in the tub and shower. Do not use towel bars as grab bars.  Use non-skid mats or decals in the tub or shower.  If you need to sit down in the shower, use a plastic, non-slip stool.  Keep the floor dry. Clean up any water that spills on the floor as soon as it happens.  Remove soap buildup in the tub or shower  regularly.  Attach bath mats securely with double-sided non-slip rug tape.  Do not have throw rugs and other things on the floor that can make you trip. What can I do in the bedroom?  Use night lights.  Make sure that you have a light by your bed that is easy to reach.  Do not use any sheets or blankets that are too big for your bed. They should not hang down onto the floor.  Have a firm chair that has side arms. You can use this for support while you get dressed.  Do not have throw rugs and other things on the floor that can make you trip. What can I do in the kitchen?  Clean up any spills right away.  Avoid walking on wet floors.  Keep items that you use a lot in easy-to-reach places.  If you need to reach something above you, use a strong step stool that has a grab bar.  Keep electrical cords out of the way.  Do not use floor polish or wax that makes floors slippery. If you must use wax, use non-skid floor wax.  Do not have throw rugs and other things on the floor that can make you trip. What can I do with my  stairs?  Do not leave any items on the stairs.  Make sure that there are handrails on both sides of the stairs and use them. Fix handrails that are broken or loose. Make sure that handrails are as long as the stairways.  Check any carpeting to make sure that it is firmly attached to the stairs. Fix any carpet that is loose or worn.  Avoid having throw rugs at the top or bottom of the stairs. If you do have throw rugs, attach them to the floor with carpet tape.  Make sure that you have a light switch at the top of the stairs and the bottom of the stairs. If you do not have them, ask someone to add them for you. What else can I do to help prevent falls?  Wear shoes that:  Do not have high heels.  Have rubber bottoms.  Are comfortable and fit you well.  Are closed at the toe. Do not wear sandals.  If you use a stepladder:  Make sure that it is fully  opened. Do not climb a closed stepladder.  Make sure that both sides of the stepladder are locked into place.  Ask someone to hold it for you, if possible.  Clearly mark and make sure that you can see:  Any grab bars or handrails.  First and last steps.  Where the edge of each step is.  Use tools that help you move around (mobility aids) if they are needed. These include:  Canes.  Walkers.  Scooters.  Crutches.  Turn on the lights when you go into a dark area. Replace any light bulbs as soon as they burn out.  Set up your furniture so you have a clear path. Avoid moving your furniture around.  If any of your floors are uneven, fix them.  If there are any pets around you, be aware of where they are.  Review your medicines with your doctor. Some medicines can make you feel dizzy. This can increase your chance of falling. Ask your doctor what other things that you can do to help prevent falls. This information is not intended to replace advice given to you by your health care provider. Make sure you discuss any questions you have with your health care provider. Document Released: 04/29/2009 Document Revised: 12/09/2015 Document Reviewed: 08/07/2014 Elsevier Interactive Patient Education  2017 [employer].

## 2020-04-20 NOTE — Progress Notes (Signed)
Subjective:   [name] is a 86 y.o. female who presents for an Initial Medicare Annual Wellness Visit.  I connected with Daniele today by telephone and verified that I am speaking with the correct person using two identifiers. Location patient: home Location provider: work Persons participating in the virtual visit: patient, Marine scientist.    I discussed the limitations, risks, security and privacy concerns of performing an evaluation and management service by telephone and the availability of in person appointments. I also discussed with the patient that there may be a patient responsible charge related to this service. The patient expressed understanding and verbally consented to this telephonic visit.    Interactive audio and video telecommunications were attempted between this provider and patient, however failed, due to patient having technical difficulties OR patient did not have access to video capability.  We continued and completed visit with audio only.  Some vital signs may be absent or patient reported.   Time Spent with patient on telephone encounter: 40 minutes  Review of Systems     Cardiac Risk Factors include: advanced age (>69men, >82 women);obesity (BMI >30kg/m2);sedentary lifestyle     Objective:    Today's Vitals   04/20/20 1113  Weight: 178 lb (80.7 kg)  Height: 5\' 4"  (1.626 m)   Body mass index is 30.55 kg/m.  Advanced Directives 04/20/2020 08/04/2013 08/04/2013 07/30/2013 03/13/2012 03/08/2012  Does Patient Have a Medical Advance Directive? Yes Patient has advance directive, copy in chart Patient has advance directive, copy in chart Patient has advance directive, copy not in chart Patient has advance directive, copy in chart Patient has advance directive, copy not in chart  Type of Advance Directive Living will [place];Living will - Living will;Healthcare Power of Attorney - Living will;Healthcare Power of Attorney  Does patient want to make  changes to medical advance directive? - No - No - -  Copy of Healthcare Power of Attorney in Chart? - - - - - Copy requested from family  Pre-existing out of facility DNR order (yellow form or pink MOST form) - No - No - -    Current Medications (verified) Outpatient Encounter Medications as of 04/20/2020  Medication Sig  . cetirizine (ZYRTEC) 10 MG chewable tablet Chew 10 mg by mouth daily.  . cholecalciferol (VITAMIN D3) 25 MCG (1000 UNIT) tablet Take 1,000 Units by mouth daily.  . diazepam (VALIUM) 2 MG tablet TAKE 1 TABLET EVERY 8 HOURS AS NEEDED FOR ANXIETY AND TREMOR  . hyoscyamine (LEVSIN) 0.125 MG tablet Take 1 tablet (0.125 mg total) by mouth every 4 (four) hours as needed for cramping.  Marland Kitchen MYSOLINE 250 MG tablet Take 250 mg by mouth 2 (two) times daily.  Marland Kitchen omeprazole (PRILOSEC) 20 MG capsule Take 1 capsule (20 mg total) by mouth daily.  . memantine (NAMENDA) 10 MG tablet 1/2 daily for a week, then 1/2 BID for a week, then 1/2 in AM and 1 in PM for a week, then 1 BID (Patient not taking: Reported on 04/20/2020)  . tiZANidine (ZANAFLEX) 4 MG capsule Take 1 capsule (4 mg total) by mouth at bedtime as needed for muscle spasms. (Patient not taking: Reported on 04/20/2020)  . [DISCONTINUED] amoxicillin-clavulanate (AUGMENTIN) 875-125 MG tablet Take 1 tablet by mouth 2 (two) times daily.  . [DISCONTINUED] naproxen (NAPROSYN) 500 MG tablet Take 1 tablet (500 mg total) by mouth 2 (two) times daily with a meal.   No facility-administered encounter medications on file as of 04/20/2020.  Allergies (verified) Memantine, Propranolol, Codeine, and Sulfamethoxazole   History: Past Medical History:  Diagnosis Date  . Allergy   . Chicken pox   . Essential tremor    of head, sometimes hands.  pt takes primidone to help the tremors-DR. MILLER-NEUROLOGIST IN [place].  PT WAS STARTED ON VALUIM MANY YEARS AGO FOR HER TREMORS--AND CONTINUES TO TAKE.  . Fracture of humerus, proximal, left, closed  October 28, 2011   pt has finished physical therapy-limited ROM reaching to her back and unable to lift heavy things with left hand/arm  . GERD (gastroesophageal reflux disease)   . H/O hiatal hernia   . Hx of colonic polyps   . Osteoarthritis    PAIN AND OA BILATERAL KNEES; ALSO HAS ARTHRITIS IN HANDS AND BACK BUT NO BACK PAIN   Past Surgical History:  Procedure Laterality Date  . arthroscopy left knee    . arthroscopy right knee    . bmp  2002  . COLONOSCOPY W/ POLYPECTOMY    . DILATION AND CURETTAGE OF UTERUS    . JOINT REPLACEMENT Left   . RIGHT FOOT SURGERIES X 2    . TOTAL KNEE ARTHROPLASTY  03/13/2012   Procedure: TOTAL KNEE ARTHROPLASTY;  Surgeon: Gearlean Alf, MD;  Location: [facility];  Service: Orthopedics;  Laterality: Left;  steroid injection right knee  . TOTAL KNEE ARTHROPLASTY Right 08/04/2013   Procedure: RIGHT TOTAL KNEE ARTHROPLASTY;  Surgeon: Gearlean Alf, MD;  Location: [facility];  Service: Orthopedics;  Laterality: Right;   Family History  Problem Relation Age of Onset  . Kidney failure Other        renal disease   Social History   Socioeconomic History  . Marital status: Widowed    Spouse name: Not on file  . Number of children: Not on file  . Years of education: Not on file  . Highest education level: Not on file  Occupational History  . Not on file  Tobacco Use  . Smoking status: Never Smoker  . Smokeless tobacco: Never Used  Substance and Sexual Activity  . Alcohol use: Yes    Comment: ONE GLASS WINE DAILY  . Drug use: No  . Sexual activity: Not Currently  Other Topics Concern  . Not on file  Social History Narrative  . Not on file   Social Determinants of Health   Financial Resource Strain: Low Risk   . Difficulty of Paying Living Expenses: Not hard at all  Food Insecurity: No Food Insecurity  . Worried About Charity fundraiser in the Last Year: Never true  . Ran Out of Food in the Last Year: Never true  Transportation Needs: No  Transportation Needs  . Lack of Transportation (Medical): No  . Lack of Transportation (Non-Medical): No  Physical Activity: Inactive  . Days of Exercise per Week: 0 days  . Minutes of Exercise per Session: 0 min  Stress: No Stress Concern Present  . Feeling of Stress : Only a little  Social Connections: Moderately Isolated  . Frequency of Communication with Friends and Family: More than three times a week  . Frequency of Social Gatherings with Friends and Family: Once a week  . Attends Religious Services: 1 to 4 times per year  . Active Member of Clubs or Organizations: No  . Attends Archivist Meetings: Never  . Marital Status: Widowed    Tobacco Counseling Counseling given: Not Answered   Clinical Intake:  Pre-visit preparation completed: Yes  Pain : No/denies pain     Nutritional Status: BMI > 30  Obese Nutritional Risks: None Diabetes: No  How often do you need to have someone help you when you read instructions, pamphlets, or other written materials from your doctor or pharmacy?: 1 - Never What is the last grade level you completed in school?: 1 year of college  Diabetic?No  Interpreter Needed?: No  Information entered by :: Caroleen Hamman LPN   Activities of Daily Living In your present state of health, do you have any difficulty performing the following activities: 04/20/2020 04/30/2019  Hearing? N N  Vision? N N  Difficulty concentrating or making decisions? N N  Walking or climbing stairs? N N  Dressing or bathing? N N  Doing errands, shopping? N N  Preparing Food and eating ? N -  Using the Toilet? N -  In the past six months, have you accidently leaked urine? Y -  Comment wears depends sometimes -  Do you have problems with loss of bowel control? N -  Managing your Medications? N -  Managing your Finances? N -  Housekeeping or managing your Housekeeping? N -  Some recent data might be hidden    Patient Care Team: Ronnald Nian,  DO as PCP - General (Family Medicine)  Indicate any recent Medical Services you may have received from other than [facility] providers in the past year (date may be approximate).     Assessment:   This is a routine wellness examination for [name].  Hearing/Vision screen  Hearing Screening   125Hz  250Hz  500Hz  1000Hz  2000Hz  3000Hz  4000Hz  6000Hz  8000Hz   Right ear:           Left ear:           Comments: No hearing loss  Vision Screening Comments: Reading glasses Last eye exam-2020-  Dietary issues and exercise activities discussed: Current Exercise Habits: The patient does not participate in regular exercise at present, Exercise limited by: Other - see comments (tremor)  Goals    . Patient Stated     Does not wish to set any goals      Depression Screen PHQ 2/9 Scores 04/20/2020 11/13/2017  PHQ - 2 Score 0 0    Fall Risk Fall Risk  04/20/2020 04/30/2019 02/14/2019 11/13/2017  Falls in the past year? 0 0 0 No  Comment - - Emmi Telephone Survey: data to providers prior to load -  Number falls in past yr: 0 - - -  Injury with Fall? 0 - - -  Follow up Falls prevention discussed Falls evaluation completed - -    Any stairs in or around the home? Yes  If so, are there any without handrails? No  Home free of loose throw rugs in walkways, pet beds, electrical cords, etc? Yes  Adequate lighting in your home to reduce risk of falls? Yes   ASSISTIVE DEVICES UTILIZED TO PREVENT FALLS:  Life alert? No  Use of a cane, walker or w/c? No  Grab bars in the bathroom? Yes  Shower chair or bench in shower? No  Elevated toilet seat or a handicapped toilet? No   TIMED UP AND GO:  Was the test performed? No . Phone visit   Cognitive Function:No cognitive impairment noted.     6CIT Screen 04/20/2020  What Year? 0 points  What month? 0 points  What time? 0 points  Count back from 20 0 points  Months in reverse 0 points  Repeat phrase 0  points  Total Score 0     Immunizations Immunization History  Administered Date(s) Administered  . Fluad Quad(high Dose 65+) 04/30/2019  . Influenza Whole 05/15/2007, 06/02/2009  . Influenza, High Dose Seasonal PF 08/13/2017, 06/17/2018  . PFIZER SARS-COV-2 Vaccination 10/02/2019, 10/27/2019  . Pneumococcal Conjugate-13 03/18/2015  . Pneumococcal Polysaccharide-23 06/25/2012  . Td 07/17/1992    TDAP status: Due, Education has been provided regarding the importance of this vaccine. Advised may receive this vaccine at local pharmacy or Health Dept. Aware to provide a copy of the vaccination record if obtained from local pharmacy or Health Dept. Verbalized acceptance and understanding.   Flu vaccine status: Due- Patient plans to get the vaccine next week at her office visit with PCP.  Pneumococcal vaccine status: Up to date   Covid-19 vaccine status: Completed vaccines  Qualifies for Shingles Vaccine? Yes   Zostavax completed No   Shingrix Completed?: No.    Education has been provided regarding the importance of this vaccine. Patient has been advised to call insurance company to determine out of pocket expense if they have not yet received this vaccine. Advised may also receive vaccine at local pharmacy or Health Dept. Verbalized acceptance and understanding.  Screening Tests Health Maintenance  Topic Date Due  . INFLUENZA VACCINE  02/15/2020  . TETANUS/TDAP  04/28/2020 (Originally 07/17/2002)  . DEXA SCAN  Completed  . COVID-19 Vaccine  Completed  . PNA vac Low Risk Adult  Completed    Health Maintenance  Health Maintenance Due  Topic Date Due  . INFLUENZA VACCINE  02/15/2020    Colorectal cancer screening: No longer required.    Mammogram status: No longer required.    Bone Density Status: Declined.  Lung Cancer Screening: (Low Dose CT Chest recommended if Age 35-80 years, 30 pack-year currently smoking OR have quit w/in 15years.) does not qualify.     Additional Screening:  Hepatitis  C Screening: does not qualify  Vision Screening: Recommended annual ophthalmology exams for early detection of glaucoma and other disorders of the eye. Is the patient up to date with their annual eye exam?  Yes  Who is the provider or what is the name of the office in which the patient attends annual eye exams? Unsure of name   Dental Screening: Recommended annual dental exams for proper oral hygiene  Community Resource Referral / Chronic Care Management: CRR required this visit?  No   CCM required this visit?  No      Plan:     I have personally reviewed and noted the following in the patient's chart:   . Medical and social history . Use of alcohol, tobacco or illicit drugs  . Current medications and supplements . Functional ability and status . Nutritional status . Physical activity . Advanced directives . List of other physicians . Hospitalizations, surgeries, and ER visits in previous 12 months . Vitals . Screenings to include cognitive, depression, and falls . Referrals and appointments  In addition, I have reviewed and discussed with patient certain preventive protocols, quality metrics, and best practice recommendations. A written personalized care plan for preventive services as well as general preventive health recommendations were provided to patient.    Due to this being a telephonic visit, the after visit summary with patients personalized plan was offered to patient via mail or my-chart. Patient would like to access on my-chart.   Marta Antu, LPN   [id number]  Nurse Health Advisor  Nurse Notes: None

## 2020-04-29 ENCOUNTER — Other Ambulatory Visit: Payer: Self-pay

## 2020-04-30 ENCOUNTER — Ambulatory Visit (INDEPENDENT_AMBULATORY_CARE_PROVIDER_SITE_OTHER): Payer: Medicare Other | Admitting: Family Medicine

## 2020-04-30 ENCOUNTER — Encounter: Payer: Self-pay | Admitting: Family Medicine

## 2020-04-30 VITALS — BP 130/76 | HR 62 | Temp 97.1°F | Ht 64.0 in | Wt 180.4 lb

## 2020-04-30 DIAGNOSIS — R0609 Other forms of dyspnea: Secondary | ICD-10-CM

## 2020-04-30 DIAGNOSIS — Z23 Encounter for immunization: Secondary | ICD-10-CM

## 2020-04-30 DIAGNOSIS — R0600 Dyspnea, unspecified: Secondary | ICD-10-CM | POA: Diagnosis not present

## 2020-04-30 DIAGNOSIS — R5383 Other fatigue: Secondary | ICD-10-CM

## 2020-04-30 LAB — LIPID PANEL
Cholesterol: 258 mg/dL — ABNORMAL HIGH (ref 0–200)
HDL: 52.1 mg/dL (ref >39.00)
Total CHOL/HDL Ratio: 5
Triglycerides: 435 mg/dL — ABNORMAL HIGH (ref 0.0–149.0)

## 2020-04-30 LAB — CBC WITH DIFFERENTIAL/PLATELET
Basophils Absolute: 0.1 10*3/uL (ref 0.0–0.1)
Basophils Relative: 1.8 % (ref 0.0–3.0)
Eosinophils Absolute: 0.1 10*3/uL (ref 0.0–0.7)
Eosinophils Relative: 1.8 % (ref 0.0–5.0)
HCT: 40.9 % (ref 36.0–46.0)
Hemoglobin: 13.8 g/dL (ref 12.0–15.0)
Lymphocytes Relative: 51 % — ABNORMAL HIGH (ref 12.0–46.0)
Lymphs Abs: 2.3 10*3/uL (ref 0.7–4.0)
MCHC: 33.7 g/dL (ref 30.0–36.0)
MCV: 98.5 fl (ref 78.0–100.0)
Monocytes Absolute: 0.4 10*3/uL (ref 0.1–1.0)
Monocytes Relative: 8.1 % (ref 3.0–12.0)
Neutro Abs: 1.7 10*3/uL (ref 1.4–7.7)
Neutrophils Relative %: 37.3 % — ABNORMAL LOW (ref 43.0–77.0)
Platelets: 165 10*3/uL (ref 150.0–400.0)
RBC: 4.15 Mil/uL (ref 3.87–5.11)
RDW: 13.1 % (ref 11.5–15.5)
WBC: 4.5 10*3/uL (ref 4.0–10.5)

## 2020-04-30 LAB — VITAMIN B12: Vitamin B-12: 164 pg/mL — ABNORMAL LOW (ref 211–911)

## 2020-04-30 LAB — COMPREHENSIVE METABOLIC PANEL
ALT: 25 U/L (ref 0–35)
AST: 22 U/L (ref 0–37)
Albumin: 4.1 g/dL (ref 3.5–5.2)
Alkaline Phosphatase: 66 U/L (ref 39–117)
BUN: 20 mg/dL (ref 6–23)
CO2: 30 mEq/L (ref 19–32)
Calcium: 9 mg/dL (ref 8.4–10.5)
Chloride: 103 mEq/L (ref 96–112)
Creatinine, Ser: 0.86 mg/dL (ref 0.40–1.20)
GFR: 61.9 mL/min (ref >60.00)
Glucose, Bld: 92 mg/dL (ref 70–99)
Potassium: 4.4 mEq/L (ref 3.5–5.1)
Sodium: 139 mEq/L (ref 135–145)
Total Bilirubin: 0.5 mg/dL (ref 0.2–1.2)
Total Protein: 7 g/dL (ref 6.0–8.3)

## 2020-04-30 LAB — LDL CHOLESTEROL, DIRECT: Direct LDL: 108 mg/dL

## 2020-04-30 LAB — T4, FREE: Free T4: 0.61 ng/dL (ref 0.60–1.60)

## 2020-04-30 LAB — TSH: TSH: 1.9 u[IU]/mL (ref 0.35–4.50)

## 2020-04-30 LAB — VITAMIN D 25 HYDROXY (VIT D DEFICIENCY, FRACTURES): VITD: 15.86 ng/mL — ABNORMAL LOW (ref 30.00–100.00)

## 2020-04-30 NOTE — Progress Notes (Signed)
[name] is a 86 y.o. female  Chief Complaint  Patient presents with  . Follow-up    f/u, also been having chest pains on/off x 3 moths, extreme fatigue, dizziness offf/on x 1 year worse with activies.  will get flu shot today.     HPI: [name] is a 86 y.o. female seen today and complains of "extreme fatigue" and sleeping a lot more than normal. She endorses increasing DOE. She also notes very brief episodes of chest pain that she describes as "electrial shock". Improves if she presses on it. She notes episodes of lightheadedness x 1 year, worse when she exerts herself.  No n/v, diaphoresis. No near-syncope or syncopal episodes. No palpitations. No LE edema. Appetite is good. Pt has gained weight.   She has seen ENT Dr. Lucia Gaskins, neuro Dr. Sabra Heck.   She would like flu shot today.   Past Medical History:  Diagnosis Date  . Allergy   . Chicken pox   . Essential tremor    of head, sometimes hands.  pt takes primidone to help the tremors-DR. MILLER-NEUROLOGIST IN [place].  PT WAS STARTED ON VALUIM MANY YEARS AGO FOR HER TREMORS--AND CONTINUES TO TAKE.  . Fracture of humerus, proximal, left, closed October 28, 2011   pt has finished physical therapy-limited ROM reaching to her back and unable to lift heavy things with left hand/arm  . GERD (gastroesophageal reflux disease)   . H/O hiatal hernia   . Hx of colonic polyps   . Osteoarthritis    PAIN AND OA BILATERAL KNEES; ALSO HAS ARTHRITIS IN HANDS AND BACK BUT NO BACK PAIN    Past Surgical History:  Procedure Laterality Date  . arthroscopy left knee    . arthroscopy right knee    . bmp  2002  . COLONOSCOPY W/ POLYPECTOMY    . DILATION AND CURETTAGE OF UTERUS    . JOINT REPLACEMENT Left   . RIGHT FOOT SURGERIES X 2    . TOTAL KNEE ARTHROPLASTY  03/13/2012   Procedure: TOTAL KNEE ARTHROPLASTY;  Surgeon: Gearlean Alf, MD;  Location: [facility];  Service: Orthopedics;  Laterality: Left;  steroid injection right knee  .  TOTAL KNEE ARTHROPLASTY Right 08/04/2013   Procedure: RIGHT TOTAL KNEE ARTHROPLASTY;  Surgeon: Gearlean Alf, MD;  Location: [facility];  Service: Orthopedics;  Laterality: Right;    Social History   Socioeconomic History  . Marital status: Widowed    Spouse name: Not on file  . Number of children: Not on file  . Years of education: Not on file  . Highest education level: Not on file  Occupational History  . Not on file  Tobacco Use  . Smoking status: Never Smoker  . Smokeless tobacco: Never Used  Substance and Sexual Activity  . Alcohol use: Yes    Comment: ONE GLASS WINE DAILY  . Drug use: No  . Sexual activity: Not Currently  Other Topics Concern  . Not on file  Social History Narrative  . Not on file   Social Determinants of Health   Financial Resource Strain: Low Risk   . Difficulty of Paying Living Expenses: Not hard at all  Food Insecurity: No Food Insecurity  . Worried About Charity fundraiser in the Last Year: Never true  . Ran Out of Food in the Last Year: Never true  Transportation Needs: No Transportation Needs  . Lack of Transportation (Medical): No  . Lack of Transportation (Non-Medical): No  Physical  Activity: Inactive  . Days of Exercise per Week: 0 days  . Minutes of Exercise per Session: 0 min  Stress: No Stress Concern Present  . Feeling of Stress : Only a little  Social Connections: Moderately Isolated  . Frequency of Communication with Friends and Family: More than three times a week  . Frequency of Social Gatherings with Friends and Family: Once a week  . Attends Religious Services: 1 to 4 times per year  . Active Member of Clubs or Organizations: No  . Attends Archivist Meetings: Never  . Marital Status: Widowed  Intimate Partner Violence: Not At Risk  . Fear of Current or Ex-Partner: No  . Emotionally Abused: No  . Physically Abused: No  . Sexually Abused: No    Family History  Problem Relation Age of Onset  . Kidney failure  Other        renal disease     Immunization History  Administered Date(s) Administered  . Fluad Quad(high Dose 65+) 04/30/2019  . Influenza Whole 05/15/2007, 06/02/2009  . Influenza, High Dose Seasonal PF 08/13/2017, 06/17/2018  . PFIZER SARS-COV-2 Vaccination 10/02/2019, 10/27/2019  . Pneumococcal Conjugate-13 03/18/2015  . Pneumococcal Polysaccharide-23 06/25/2012  . Td 07/17/1992    Outpatient Encounter Medications as of 04/30/2020  Medication Sig  . cetirizine (ZYRTEC) 10 MG chewable tablet Chew 10 mg by mouth daily.  . cholecalciferol (VITAMIN D3) 25 MCG (1000 UNIT) tablet Take 1,000 Units by mouth daily.  . diazepam (VALIUM) 2 MG tablet TAKE 1 TABLET EVERY 8 HOURS AS NEEDED FOR ANXIETY AND TREMOR  . hyoscyamine (LEVSIN) 0.125 MG tablet Take 1 tablet (0.125 mg total) by mouth every 4 (four) hours as needed for cramping.  Marland Kitchen omeprazole (PRILOSEC) 20 MG capsule Take 1 capsule (20 mg total) by mouth daily.  Marland Kitchen MYSOLINE 250 MG tablet Take 250 mg by mouth 2 (two) times daily. (Patient not taking: Reported on 04/30/2020)  . [DISCONTINUED] memantine (NAMENDA) 10 MG tablet 1/2 daily for a week, then 1/2 BID for a week, then 1/2 in AM and 1 in PM for a week, then 1 BID (Patient not taking: Reported on 04/20/2020)  . [DISCONTINUED] tiZANidine (ZANAFLEX) 4 MG capsule Take 1 capsule (4 mg total) by mouth at bedtime as needed for muscle spasms. (Patient not taking: Reported on 04/20/2020)   No facility-administered encounter medications on file as of 04/30/2020.     ROS: Pertinent positives and negatives noted in HPI. Remainder of ROS non-contributory    Allergies  Allergen Reactions  . Memantine Swelling  . Propranolol Other (See Comments)    Made her head feel very funny Made her head feel very funny   . Codeine Nausea And Vomiting  . Sulfamethoxazole Rash    REACTION: unspecified    BP 130/76   Pulse 62   Temp (!) 97.1 F (36.2 C) (Temporal)   Ht 5\' 4"  (1.626 m)   Wt 180 lb  6.4 oz (81.8 kg)   SpO2 96%   BMI 30.97 kg/m   BP Readings from Last 3 Encounters:  04/30/20 130/76  02/19/20 (!) 155/82  01/08/20 140/80   Pulse Readings from Last 3 Encounters:  04/30/20 62  02/19/20 74  01/08/20 62   Wt Readings from Last 3 Encounters:  04/30/20 180 lb 6.4 oz (81.8 kg)  04/20/20 178 lb (80.7 kg)  02/19/20 178 lb 3.2 oz (80.8 kg)     Physical Exam Constitutional:      General: She is not in  acute distress.    Appearance: She is obese. She is not ill-appearing.  Eyes:     Conjunctiva/sclera: Conjunctivae normal.  Cardiovascular:     Rate and Rhythm: Normal rate and regular rhythm.     Pulses: Normal pulses.     Heart sounds: Normal heart sounds.  Pulmonary:     Effort: Pulmonary effort is normal. No respiratory distress.     Breath sounds: Normal breath sounds. No wheezing, rhonchi or rales.  Musculoskeletal:     Right lower leg: No edema.     Left lower leg: No edema.  Lymphadenopathy:     Cervical: No cervical adenopathy.  Skin:    General: Skin is warm and dry.     Capillary Refill: Capillary refill takes less than 2 seconds.     Coloration: Skin is not pale.  Neurological:     General: No focal deficit present.     Mental Status: She is alert and oriented to person, place, and time.     Motor: No weakness.     Coordination: Coordination normal.     Gait: Gait normal.  Psychiatric:        Mood and Affect: Mood normal.        Behavior: Behavior normal.      A/P:  1. Other fatigue 2. DOE (dyspnea on exertion) - symptoms x about 3 mo - no SOB at rest, orthopnea, LE edema. Pt is most bothered by overwhelming and extreme fatigue and as a result sleeping much of the time - will check labs and also get echo - CBC w/Diff - Comprehensive metabolic panel - TSH - T4, free - VITAMIN D 25 Hydroxy (Vit-D Deficiency, Fractures) - Vitamin B12 - Lipid panel - ECHOCARDIOGRAM COMPLETE; Future Discussed plan and reviewed medications with patient,  including risks, benefits, and potential side effects. Pt expressed understand. All questions answered.   I spent 30 min with the patient today obtaining HPI and discussing symptoms, performing exam, discussing work-up/evaluation.  This visit occurred during the SARS-CoV-2 public health emergency.  Safety protocols were in place, including screening questions prior to the visit, additional usage of staff PPE, and extensive cleaning of exam room while observing appropriate contact time as indicated for disinfecting solutions.

## 2020-04-30 NOTE — Addendum Note (Signed)
Addended by: Konrad Saha on: 04/30/2020 12:04 PM   Modules accepted: Orders

## 2020-05-05 ENCOUNTER — Encounter: Payer: Self-pay | Admitting: Family Medicine

## 2020-05-05 ENCOUNTER — Other Ambulatory Visit: Payer: Self-pay | Admitting: Family Medicine

## 2020-05-05 ENCOUNTER — Ambulatory Visit: Payer: Medicare Other

## 2020-05-05 DIAGNOSIS — E781 Pure hyperglyceridemia: Secondary | ICD-10-CM

## 2020-05-05 DIAGNOSIS — E538 Deficiency of other specified B group vitamins: Secondary | ICD-10-CM

## 2020-05-05 DIAGNOSIS — E559 Vitamin D deficiency, unspecified: Secondary | ICD-10-CM

## 2020-05-05 HISTORY — DX: Deficiency of other specified B group vitamins: E53.8

## 2020-05-05 MED ORDER — VITAMIN D (ERGOCALCIFEROL) 1.25 MG (50000 UNIT) PO CAPS: 50000.0000 [IU] | ORAL_CAPSULE | ORAL | 3 refills | Status: DC

## 2020-05-07 MED ORDER — FENOFIBRATE 145 MG PO TABS: 145.0000 mg | ORAL_TABLET | Freq: Every day | ORAL | 3 refills | Status: DC

## 2020-05-11 ENCOUNTER — Other Ambulatory Visit: Payer: Self-pay

## 2020-05-12 ENCOUNTER — Ambulatory Visit (INDEPENDENT_AMBULATORY_CARE_PROVIDER_SITE_OTHER): Payer: Medicare Other

## 2020-05-12 DIAGNOSIS — E538 Deficiency of other specified B group vitamins: Secondary | ICD-10-CM

## 2020-05-12 MED ORDER — CYANOCOBALAMIN 1000 MCG/ML IJ SOLN
1000.0000 ug | Freq: Once | INTRAMUSCULAR | Status: AC
  Administered 2020-05-12: 1000 ug via INTRAMUSCULAR

## 2020-05-12 NOTE — Progress Notes (Signed)
Per orders of Dr Bryan Lemma, 1st weekly injection of B12 given by Armandina Gemma, cma.  Patient tolerated injection well. appt scheduled for 05/19/20 @ 3:00 pm.

## 2020-05-12 NOTE — Progress Notes (Signed)
I reviewed and agree with the documentation and plan as outlined below.   

## 2020-05-18 ENCOUNTER — Other Ambulatory Visit: Payer: Self-pay

## 2020-05-18 ENCOUNTER — Ambulatory Visit (HOSPITAL_COMMUNITY): Payer: Medicare Other | Attending: Cardiovascular Disease

## 2020-05-18 DIAGNOSIS — R0600 Dyspnea, unspecified: Secondary | ICD-10-CM | POA: Insufficient documentation

## 2020-05-18 DIAGNOSIS — R0609 Other forms of dyspnea: Secondary | ICD-10-CM

## 2020-05-18 DIAGNOSIS — R5383 Other fatigue: Secondary | ICD-10-CM

## 2020-05-18 LAB — ECHOCARDIOGRAM COMPLETE
AR max vel: 2.56 cm2
AV Area VTI: 2.55 cm2
AV Area mean vel: 2.58 cm2
AV Mean grad: 6 mmHg
AV Peak grad: 12.8 mmHg
Ao pk vel: 1.79 m/s
Area-P 1/2: 2.91 cm2
P 1/2 time: 414 msec
S' Lateral: 2.6 cm

## 2020-05-19 ENCOUNTER — Ambulatory Visit: Payer: Medicare Other

## 2020-05-19 ENCOUNTER — Ambulatory Visit (INDEPENDENT_AMBULATORY_CARE_PROVIDER_SITE_OTHER): Payer: Medicare Other

## 2020-05-19 DIAGNOSIS — E538 Deficiency of other specified B group vitamins: Secondary | ICD-10-CM | POA: Diagnosis not present

## 2020-05-19 MED ORDER — CYANOCOBALAMIN 1000 MCG/ML IJ SOLN
1000.0000 ug | Freq: Once | INTRAMUSCULAR | Status: AC
  Administered 2020-05-19: 1000 ug via INTRAMUSCULAR

## 2020-05-19 NOTE — Patient Instructions (Signed)
Health Maintenance Due  Topic Date Due  . TETANUS/TDAP  07/17/2002    Depression screen [facility] 2/9 04/20/2020 11/13/2017  Decreased Interest 0 0  Down, Depressed, Hopeless 0 0  PHQ - 2 Score 0 0

## 2020-05-19 NOTE — Progress Notes (Signed)
Per orders of Dr. Bryan Lemma injection of B12 injection given by Verline Lema L Gerturde Kuba in left deltoid. Patient tolerated injection well. Patient will make appointment for 1.week.

## 2020-05-19 NOTE — Progress Notes (Signed)
I reviewed and agree with the documentation and plan as outlined below.   

## 2020-05-25 ENCOUNTER — Other Ambulatory Visit: Payer: Self-pay

## 2020-05-26 ENCOUNTER — Ambulatory Visit (INDEPENDENT_AMBULATORY_CARE_PROVIDER_SITE_OTHER): Payer: Medicare Other

## 2020-05-26 DIAGNOSIS — E538 Deficiency of other specified B group vitamins: Secondary | ICD-10-CM | POA: Diagnosis not present

## 2020-05-26 MED ORDER — CYANOCOBALAMIN 1000 MCG/ML IJ SOLN
1000.0000 ug | Freq: Once | INTRAMUSCULAR | Status: AC
  Administered 2020-05-26: 1000 ug via INTRAMUSCULAR

## 2020-05-26 NOTE — Progress Notes (Signed)
Medical screening examination/treatment/procedure(s) were performed by the CMA. As primary care provider I was immediately available for consulation/collaboration. I agree with above documentation. Radonna Bracher, AGNP-C 

## 2020-05-26 NOTE — Progress Notes (Signed)
Per orders of Dr Bryan Lemma, injection of B12 given by Armandina Gemma, cma.  Patient tolerated injection well. Last weekly B12 shot scheduled for 06/02/20 then will get monthly.

## 2020-06-01 ENCOUNTER — Other Ambulatory Visit: Payer: Self-pay

## 2020-06-02 ENCOUNTER — Ambulatory Visit (INDEPENDENT_AMBULATORY_CARE_PROVIDER_SITE_OTHER): Payer: Medicare Other

## 2020-06-02 DIAGNOSIS — E538 Deficiency of other specified B group vitamins: Secondary | ICD-10-CM

## 2020-06-02 MED ORDER — CYANOCOBALAMIN 1000 MCG/ML IJ SOLN
1000.0000 ug | Freq: Once | INTRAMUSCULAR | Status: AC
  Administered 2020-06-02: 1000 ug via INTRAMUSCULAR

## 2020-06-02 NOTE — Progress Notes (Signed)
Per orders of Dr. Bryan Lemma  injection of B12 given by Verline Lema L Unika Nazareno in left deltoid. Patient tolerated injection well. Patient will make appointment for 1 month.

## 2020-06-02 NOTE — Patient Instructions (Signed)
Health Maintenance Due  Topic Date Due  . TETANUS/TDAP  07/17/2002    Depression screen [facility] 2/9 04/20/2020 11/13/2017  Decreased Interest 0 0  Down, Depressed, Hopeless 0 0  PHQ - 2 Score 0 0

## 2020-06-07 DIAGNOSIS — H04123 Dry eye syndrome of bilateral lacrimal glands: Secondary | ICD-10-CM | POA: Diagnosis not present

## 2020-06-07 DIAGNOSIS — H5203 Hypermetropia, bilateral: Secondary | ICD-10-CM | POA: Diagnosis not present

## 2020-06-07 DIAGNOSIS — H26493 Other secondary cataract, bilateral: Secondary | ICD-10-CM | POA: Diagnosis not present

## 2020-06-07 DIAGNOSIS — Z961 Presence of intraocular lens: Secondary | ICD-10-CM | POA: Diagnosis not present

## 2020-06-07 DIAGNOSIS — H524 Presbyopia: Secondary | ICD-10-CM | POA: Diagnosis not present

## 2020-06-07 DIAGNOSIS — H52203 Unspecified astigmatism, bilateral: Secondary | ICD-10-CM | POA: Diagnosis not present

## 2020-06-15 NOTE — Progress Notes (Signed)
I reviewed and agree with the documentation and plan as outlined below.   

## 2020-06-23 ENCOUNTER — Ambulatory Visit (INDEPENDENT_AMBULATORY_CARE_PROVIDER_SITE_OTHER): Payer: Medicare Other

## 2020-06-23 ENCOUNTER — Other Ambulatory Visit: Payer: Self-pay

## 2020-06-23 DIAGNOSIS — E538 Deficiency of other specified B group vitamins: Secondary | ICD-10-CM | POA: Diagnosis not present

## 2020-06-23 MED ORDER — CYANOCOBALAMIN 1000 MCG/ML IJ SOLN
1000.0000 ug | Freq: Once | INTRAMUSCULAR | Status: AC
  Administered 2020-06-23: 1000 ug via INTRAMUSCULAR

## 2020-06-23 NOTE — Progress Notes (Signed)
Per orders of Dr. Cirigliano  injection of B12  given by Ilyse Tremain L Franciszek Platten in right deltoid. Patient tolerated injection well. No signs or symptoms of a reaction were noted prior to patient leaving the nurse visit. Patient will make appointment for 1 month.   

## 2020-06-30 ENCOUNTER — Ambulatory Visit: Payer: Medicare Other

## 2020-07-22 ENCOUNTER — Other Ambulatory Visit: Payer: Self-pay

## 2020-07-23 ENCOUNTER — Ambulatory Visit: Payer: Medicare Other | Admitting: Family Medicine

## 2020-07-24 DIAGNOSIS — Z1152 Encounter for screening for COVID-19: Secondary | ICD-10-CM | POA: Diagnosis not present

## 2020-07-24 DIAGNOSIS — R1084 Generalized abdominal pain: Secondary | ICD-10-CM | POA: Diagnosis not present

## 2020-07-24 DIAGNOSIS — R197 Diarrhea, unspecified: Secondary | ICD-10-CM | POA: Diagnosis not present

## 2020-07-27 ENCOUNTER — Ambulatory Visit: Payer: Medicare Other

## 2020-07-30 ENCOUNTER — Other Ambulatory Visit: Payer: Self-pay

## 2020-08-03 ENCOUNTER — Ambulatory Visit: Payer: Medicare Other

## 2020-08-03 ENCOUNTER — Other Ambulatory Visit: Payer: Self-pay | Admitting: Family Medicine

## 2020-08-03 DIAGNOSIS — F419 Anxiety disorder, unspecified: Secondary | ICD-10-CM

## 2020-08-03 DIAGNOSIS — G250 Essential tremor: Secondary | ICD-10-CM

## 2020-08-03 NOTE — Telephone Encounter (Signed)
Refill request for  Diazepam 2 mg  LR 02/19/20, #180, 1 rf LOV 04/30/20 FOV none scheduled.  Please advise.  Thanks. Dm/cma

## 2020-08-11 ENCOUNTER — Ambulatory Visit (INDEPENDENT_AMBULATORY_CARE_PROVIDER_SITE_OTHER): Payer: Medicare Other

## 2020-08-11 ENCOUNTER — Other Ambulatory Visit: Payer: Self-pay

## 2020-08-11 DIAGNOSIS — E538 Deficiency of other specified B group vitamins: Secondary | ICD-10-CM

## 2020-08-11 MED ORDER — CYANOCOBALAMIN 1000 MCG/ML IJ SOLN
1000.0000 ug | Freq: Once | INTRAMUSCULAR | Status: AC
  Administered 2020-08-11: 1000 ug via INTRAMUSCULAR

## 2020-08-11 NOTE — Progress Notes (Signed)
Pt is here for B12 injection. per orders of Dr.Cirigliano, pt was given injection pt tolerated injection well. Pt is to return in around 1 month for next injection

## 2020-08-18 DIAGNOSIS — R269 Unspecified abnormalities of gait and mobility: Secondary | ICD-10-CM | POA: Diagnosis not present

## 2020-08-18 DIAGNOSIS — G479 Sleep disorder, unspecified: Secondary | ICD-10-CM | POA: Diagnosis not present

## 2020-08-18 DIAGNOSIS — G250 Essential tremor: Secondary | ICD-10-CM | POA: Diagnosis not present

## 2020-08-18 DIAGNOSIS — Z9181 History of falling: Secondary | ICD-10-CM | POA: Diagnosis not present

## 2020-08-18 DIAGNOSIS — G4733 Obstructive sleep apnea (adult) (pediatric): Secondary | ICD-10-CM | POA: Diagnosis not present

## 2020-08-19 ENCOUNTER — Ambulatory Visit (INDEPENDENT_AMBULATORY_CARE_PROVIDER_SITE_OTHER): Payer: Medicare Other | Admitting: Family Medicine

## 2020-08-19 ENCOUNTER — Encounter: Payer: Self-pay | Admitting: Family Medicine

## 2020-08-19 ENCOUNTER — Other Ambulatory Visit: Payer: Self-pay

## 2020-08-19 VITALS — BP 146/74 | HR 73 | Temp 97.3°F | Ht 64.0 in | Wt 177.6 lb

## 2020-08-19 DIAGNOSIS — M792 Neuralgia and neuritis, unspecified: Secondary | ICD-10-CM | POA: Diagnosis not present

## 2020-08-19 MED ORDER — GABAPENTIN 100 MG PO CAPS: ORAL_CAPSULE | ORAL | 0 refills | Status: DC

## 2020-08-19 NOTE — Progress Notes (Signed)
[name] is a 87 y.o. female  Chief Complaint  Patient presents with  . Acute Visit    C/o having pain in the LT rib area with stinging/burning and not able to wear a bra x weeks.      HPI: [name] is a 87 y.o. female who complains of pain but more so burning/stinging on her Lt side since 06/2020. Started under her breast over rib cage but now extends down her Lt side. Very tender and sensitive to touch, can't wear a bra. No rash. She had shingles in the past and it feels similar but she does not have a rash. No cough, SOB.  She went to UC on 07/24/20 and had xray done - ? Rib and abd - and per pt, these were normal. She also had normal urine culture at [facility]. No urinary symptoms.  BM - alternate between diarrhea and constipation. She is eatings dates which help regulate her BMs. Pt has BM every other day and this is not new/different for her. No unexplained weight loss.     Past Medical History:  Diagnosis Date  . Allergy   . Chicken pox   . Essential tremor    of head, sometimes hands.  pt takes primidone to help the tremors-DR. MILLER-NEUROLOGIST IN [place].  PT WAS STARTED ON VALUIM MANY YEARS AGO FOR HER TREMORS--AND CONTINUES TO TAKE.  . Fracture of humerus, proximal, left, closed October 28, 2011   pt has finished physical therapy-limited ROM reaching to her back and unable to lift heavy things with left hand/arm  . GERD (gastroesophageal reflux disease)   . H/O hiatal hernia   . Hx of colonic polyps   . Osteoarthritis    PAIN AND OA BILATERAL KNEES; ALSO HAS ARTHRITIS IN HANDS AND BACK BUT NO BACK PAIN    Past Surgical History:  Procedure Laterality Date  . arthroscopy left knee    . arthroscopy right knee    . bmp  2002  . COLONOSCOPY W/ POLYPECTOMY    . DILATION AND CURETTAGE OF UTERUS    . JOINT REPLACEMENT Left   . RIGHT FOOT SURGERIES X 2    . TOTAL KNEE ARTHROPLASTY  03/13/2012   Procedure: TOTAL KNEE ARTHROPLASTY;  Surgeon: Gearlean Alf, MD;   Location: [facility];  Service: Orthopedics;  Laterality: Left;  steroid injection right knee  . TOTAL KNEE ARTHROPLASTY Right 08/04/2013   Procedure: RIGHT TOTAL KNEE ARTHROPLASTY;  Surgeon: Gearlean Alf, MD;  Location: [facility];  Service: Orthopedics;  Laterality: Right;    Social History   Socioeconomic History  . Marital status: Widowed    Spouse name: Not on file  . Number of children: Not on file  . Years of education: Not on file  . Highest education level: Not on file  Occupational History  . Not on file  Tobacco Use  . Smoking status: Never Smoker  . Smokeless tobacco: Never Used  Substance and Sexual Activity  . Alcohol use: Yes    Comment: ONE GLASS WINE DAILY  . Drug use: No  . Sexual activity: Not Currently  Other Topics Concern  . Not on file  Social History Narrative  . Not on file   Social Determinants of Health   Financial Resource Strain: Low Risk   . Difficulty of Paying Living Expenses: Not hard at all  Food Insecurity: No Food Insecurity  . Worried About Charity fundraiser in the Last Year: Never true  .  Ran Out of Food in the Last Year: Never true  Transportation Needs: No Transportation Needs  . Lack of Transportation (Medical): No  . Lack of Transportation (Non-Medical): No  Physical Activity: Inactive  . Days of Exercise per Week: 0 days  . Minutes of Exercise per Session: 0 min  Stress: No Stress Concern Present  . Feeling of Stress : Only a little  Social Connections: Moderately Isolated  . Frequency of Communication with Friends and Family: More than three times a week  . Frequency of Social Gatherings with Friends and Family: Once a week  . Attends Religious Services: 1 to 4 times per year  . Active Member of Clubs or Organizations: No  . Attends Archivist Meetings: Never  . Marital Status: Widowed  Intimate Partner Violence: Not At Risk  . Fear of Current or Ex-Partner: No  . Emotionally Abused: No  . Physically Abused: No  .  Sexually Abused: No    Family History  Problem Relation Age of Onset  . Kidney failure Other        renal disease     Immunization History  Administered Date(s) Administered  . Fluad Quad(high Dose 65+) 04/30/2019, 04/30/2020  . Influenza Whole 05/15/2007, 06/02/2009  . Influenza, High Dose Seasonal PF 08/13/2017, 06/17/2018  . PFIZER(Purple Top)SARS-COV-2 Vaccination 10/02/2019, 10/27/2019  . Pneumococcal Conjugate-13 03/18/2015  . Pneumococcal Polysaccharide-23 06/25/2012  . Td 07/17/1992    Outpatient Encounter Medications as of 08/19/2020  Medication Sig  . cetirizine (ZYRTEC) 10 MG chewable tablet Chew 10 mg by mouth daily.  . cholecalciferol (VITAMIN D3) 25 MCG (1000 UNIT) tablet Take 1,000 Units by mouth daily.  . diazepam (VALIUM) 2 MG tablet TAKE 1 TABLET EVERY 8 HOURS AS NEEDED FOR ANXIETY AND TREMOR  . gabapentin (NEURONTIN) 100 MG capsule 1 tab po qHS and then increase to 2 tabs po qHS after 4-5 days  . hyoscyamine (LEVSIN) 0.125 MG tablet Take 1 tablet (0.125 mg total) by mouth every 4 (four) hours as needed for cramping.  Marland Kitchen omeprazole (PRILOSEC) 20 MG capsule Take 1 capsule (20 mg total) by mouth daily.  . Vitamin D, Ergocalciferol, (DRISDOL) 1.25 MG (50000 UNIT) CAPS capsule Take 1 capsule (50,000 Units total) by mouth every 7 (seven) days.  . fenofibrate (TRICOR) 145 MG tablet Take 1 tablet (145 mg total) by mouth daily. (Patient not taking: Reported on 08/19/2020)  . MYSOLINE 250 MG tablet Take 250 mg by mouth 2 (two) times daily. (Patient not taking: No sig reported)   No facility-administered encounter medications on file as of 08/19/2020.     ROS: Pertinent positives and negatives noted in HPI. Remainder of ROS non-contributory    Allergies  Allergen Reactions  . Memantine Swelling  . Propranolol Other (See Comments)    Made her head feel very funny Made her head feel very funny   . Codeine Nausea And Vomiting  . Sulfamethoxazole Rash    REACTION:  unspecified    BP (!) 146/74   Pulse 73   Temp (!) 97.3 F (36.3 C) (Temporal)   Ht 5\' 4"  (1.626 m)   Wt 177 lb 9.6 oz (80.6 kg)   SpO2 96%   BMI 30.48 kg/m   Physical Exam Constitutional:      General: She is not in acute distress.    Appearance: Normal appearance. She is not ill-appearing.  Cardiovascular:     Rate and Rhythm: Normal rate and regular rhythm.  Pulmonary:     Effort:  Pulmonary effort is normal. No respiratory distress.     Breath sounds: Normal breath sounds.  Skin:    General: Skin is warm and dry.     Findings: No bruising, erythema, lesion or rash.  Neurological:     General: No focal deficit present.     Mental Status: She is alert and oriented to person, place, and time.     Sensory: No sensory deficit.     Coordination: Coordination normal.      A/P:  1. Neuralgia - symptoms contained to Lt side of body - ? Atypical presentation of shingles and not postherpetic neuralgia - no urinary or GI symptoms - does not seems MSK in nature Rx: - gabapentin (NEURONTIN) 100 MG capsule; 1 tab po qHS and then increase to 2 tabs po qHS after 4-5 days  Dispense: 30 capsule; Refill: 0 - 2-3 wk trial to see if symptoms improve/resolve - f/u if no/minimal improvement in 3 wks Discussed plan and reviewed medications with patient, including risks, benefits, and potential side effects. Pt expressed understand. All questions answered.   This visit occurred during the SARS-CoV-2 public health emergency.  Safety protocols were in place, including screening questions prior to the visit, additional usage of staff PPE, and extensive cleaning of exam room while observing appropriate contact time as indicated for disinfecting solutions.

## 2021-01-19 ENCOUNTER — Encounter: Payer: Self-pay | Admitting: Family Medicine

## 2021-01-19 ENCOUNTER — Ambulatory Visit (INDEPENDENT_AMBULATORY_CARE_PROVIDER_SITE_OTHER): Payer: Medicare Other | Admitting: Family Medicine

## 2021-01-19 ENCOUNTER — Other Ambulatory Visit: Payer: Self-pay

## 2021-01-19 VITALS — BP 120/60 | HR 57 | Temp 97.4°F | Wt 169.8 lb

## 2021-01-19 DIAGNOSIS — N644 Mastodynia: Secondary | ICD-10-CM

## 2021-01-19 DIAGNOSIS — M792 Neuralgia and neuritis, unspecified: Secondary | ICD-10-CM

## 2021-01-19 DIAGNOSIS — K5909 Other constipation: Secondary | ICD-10-CM | POA: Diagnosis not present

## 2021-01-19 DIAGNOSIS — G250 Essential tremor: Secondary | ICD-10-CM | POA: Diagnosis not present

## 2021-01-19 DIAGNOSIS — K219 Gastro-esophageal reflux disease without esophagitis: Secondary | ICD-10-CM | POA: Diagnosis not present

## 2021-01-19 DIAGNOSIS — R159 Full incontinence of feces: Secondary | ICD-10-CM | POA: Diagnosis not present

## 2021-01-19 DIAGNOSIS — R152 Fecal urgency: Secondary | ICD-10-CM | POA: Diagnosis not present

## 2021-01-19 DIAGNOSIS — F419 Anxiety disorder, unspecified: Secondary | ICD-10-CM | POA: Diagnosis not present

## 2021-01-19 MED ORDER — OMEPRAZOLE 20 MG PO CPDR: 20.0000 mg | DELAYED_RELEASE_CAPSULE | Freq: Every day | ORAL | 3 refills | Status: DC

## 2021-01-19 MED ORDER — PREGABALIN 50 MG PO CAPS: 50.0000 mg | ORAL_CAPSULE | Freq: Two times a day (BID) | ORAL | 1 refills | Status: DC

## 2021-01-19 MED ORDER — DIAZEPAM 2 MG PO TABS: 2.0000 mg | ORAL_TABLET | Freq: Three times a day (TID) | ORAL | 1 refills | Status: DC | PRN

## 2021-01-19 NOTE — Progress Notes (Signed)
.od  [name] is a 87 y.o. female  Chief Complaint  Patient presents with   Breast Pain    Pt states pain started in left arm and radiated to left breast and back, pt states she has had pain for 2 months no relief. Pain scale ranges from 5-7, pain feels like bee stings in left breast. Pt states se has not taken anything OTC for pain.    HPI: [name] is a 87 y.o. female patient who complains of 2 mo Lt lateral breast pain with radiation over the top of her breast. She cannot wear a bra and only wears soft clothes.  Pain is constant. She describes pain as like "bee stings" and like a burning. Worse if she touches or bumps it.  No skin changes, no rash. No swelling.  She has not taken any OTC and can't tolerate heat or ice pack.    I saw pt in 08/2020 for similar symptoms but over her Lt side/ribcage and tried pt on gabapentin 200mg  qHS. She states it made her dizzy and she stopped taking it. Pt states this has resolved and above symptoms are not similar to this.   Pt with chronic diarrhea but also at times diarrhea with fecal incontinence. She gets the urge to have a BM but she cannot hold her stool and often soils herself. She thinks it can be due to certain types of foods or maybe d/t anxiety but also wonders if there is an issue with sphincter.  Past Medical History:  Diagnosis Date   Allergy    Chicken pox    Essential tremor    of head, sometimes hands.  pt takes primidone to help the tremors-DR. MILLER-NEUROLOGIST IN [place].  PT WAS STARTED ON VALUIM MANY YEARS AGO FOR HER TREMORS--AND CONTINUES TO TAKE.   Fracture of humerus, proximal, left, closed October 28, 2011   pt has finished physical therapy-limited ROM reaching to her back and unable to lift heavy things with left hand/arm   GERD (gastroesophageal reflux disease)    H/O hiatal hernia    Hx of colonic polyps    Osteoarthritis    PAIN AND OA BILATERAL KNEES; ALSO HAS ARTHRITIS IN HANDS AND BACK BUT NO BACK  PAIN    Past Surgical History:  Procedure Laterality Date   arthroscopy left knee     arthroscopy right knee     bmp  2002   COLONOSCOPY W/ POLYPECTOMY     DILATION AND CURETTAGE OF UTERUS     JOINT REPLACEMENT Left    RIGHT FOOT SURGERIES X 2     TOTAL KNEE ARTHROPLASTY  03/13/2012   Procedure: TOTAL KNEE ARTHROPLASTY;  Surgeon: Gearlean Alf, MD;  Location: [facility];  Service: Orthopedics;  Laterality: Left;  steroid injection right knee   TOTAL KNEE ARTHROPLASTY Right 08/04/2013   Procedure: RIGHT TOTAL KNEE ARTHROPLASTY;  Surgeon: Gearlean Alf, MD;  Location: [facility];  Service: Orthopedics;  Laterality: Right;    Social History   Socioeconomic History   Marital status: Widowed    Spouse name: Not on file   Number of children: Not on file   Years of education: Not on file   Highest education level: Not on file  Occupational History   Not on file  Tobacco Use   Smoking status: Never   Smokeless tobacco: Never  Substance and Sexual Activity   Alcohol use: Yes    Comment: ONE GLASS WINE DAILY   Drug use:  No   Sexual activity: Not Currently  Other Topics Concern   Not on file  Social History Narrative   Not on file   Social Determinants of Health   Financial Resource Strain: Low Risk    Difficulty of Paying Living Expenses: Not hard at all  Food Insecurity: No Food Insecurity   Worried About Running Out of Food in the Last Year: Never true   [place] in the Last Year: Never true  Transportation Needs: No Transportation Needs   Lack of Transportation (Medical): No   Lack of Transportation (Non-Medical): No  Physical Activity: Inactive   Days of Exercise per Week: 0 days   Minutes of Exercise per Session: 0 min  Stress: No Stress Concern Present   Feeling of Stress : Only a little  Social Connections: Moderately Isolated   Frequency of Communication with Friends and Family: More than three times a week   Frequency of Social Gatherings with Friends and  Family: Once a week   Attends Religious Services: 1 to 4 times per year   Active Member of [employer] or Organizations: No   Attends Archivist Meetings: Never   Marital Status: Widowed  Intimate Partner Violence: Not At Risk   Fear of Current or Ex-Partner: No   Emotionally Abused: No   Physically Abused: No   Sexually Abused: No    Family History  Problem Relation Age of Onset   Kidney failure Other        renal disease     Immunization History  Administered Date(s) Administered   Fluad Quad(high Dose 65+) 04/30/2019, 04/30/2020   Influenza Whole 05/15/2007, 06/02/2009   Influenza, High Dose Seasonal PF 08/13/2017, 06/17/2018   PFIZER(Purple Top)SARS-COV-2 Vaccination 10/02/2019, 10/27/2019   Pneumococcal Conjugate-13 03/18/2015   Pneumococcal Polysaccharide-23 06/25/2012   Td 07/17/1992    Outpatient Encounter Medications as of 01/19/2021  Medication Sig   cetirizine (ZYRTEC) 10 MG chewable tablet Chew 10 mg by mouth daily.   cholecalciferol (VITAMIN D3) 25 MCG (1000 UNIT) tablet Take 1,000 Units by mouth daily.   diazepam (VALIUM) 2 MG tablet TAKE 1 TABLET EVERY 8 HOURS AS NEEDED FOR ANXIETY AND TREMOR   fenofibrate (TRICOR) 145 MG tablet Take 1 tablet (145 mg total) by mouth daily. (Patient not taking: Reported on 08/19/2020)   gabapentin (NEURONTIN) 100 MG capsule 1 tab po qHS and then increase to 2 tabs po qHS after 4-5 days   hyoscyamine (LEVSIN) 0.125 MG tablet Take 1 tablet (0.125 mg total) by mouth every 4 (four) hours as needed for cramping.   MYSOLINE 250 MG tablet Take 250 mg by mouth 2 (two) times daily. (Patient not taking: No sig reported)   omeprazole (PRILOSEC) 20 MG capsule Take 1 capsule (20 mg total) by mouth daily.   Vitamin D, Ergocalciferol, (DRISDOL) 1.25 MG (50000 UNIT) CAPS capsule Take 1 capsule (50,000 Units total) by mouth every 7 (seven) days.   No facility-administered encounter medications on file as of 01/19/2021.     ROS: Pertinent  positives and negatives noted in HPI. Remainder of ROS non-contributory    Allergies  Allergen Reactions   Memantine Swelling   Propranolol Other (See Comments)    Made her head feel very funny Made her head feel very funny    Codeine Nausea And Vomiting   Sulfamethoxazole Rash    REACTION: unspecified    BP 120/60 (BP Location: Left Arm, Patient Position: Sitting, Cuff Size: Normal)   Pulse (!) 57  Temp (!) 97.4 F (36.3 C) (Temporal)   Wt 169 lb 12.8 oz (77 kg)   SpO2 94%   BMI 29.15 kg/m  Wt Readings from Last 3 Encounters:  01/19/21 169 lb 12.8 oz (77 kg)  08/19/20 177 lb 9.6 oz (80.6 kg)  04/30/20 180 lb 6.4 oz (81.8 kg)   Temp Readings from Last 3 Encounters:  01/19/21 (!) 97.4 F (36.3 C) (Temporal)  08/19/20 (!) 97.3 F (36.3 C) (Temporal)  04/30/20 (!) 97.1 F (36.2 C) (Temporal)   BP Readings from Last 3 Encounters:  01/19/21 120/60  08/19/20 (!) 146/74  04/30/20 130/76   Pulse Readings from Last 3 Encounters:  01/19/21 (!) 57  08/19/20 73  04/30/20 62     Physical Exam Constitutional:      General: She is not in acute distress.    Appearance: She is not ill-appearing.  Chest:  Breasts:    Left: Tenderness (significant TTP w/ even light palpation) present. No swelling, mass, nipple discharge, skin change or axillary adenopathy.  Lymphadenopathy:     Upper Body:     Left upper body: No axillary or pectoral adenopathy.  Neurological:     Mental Status: She is alert.     A/P:  1. Tremor, essential - follows with neurology - has worsened in the past year or so Refill: - diazepam (VALIUM) 2 MG tablet; Take 1 tablet (2 mg total) by mouth every 8 (eight) hours as needed for anxiety.  Dispense: 180 tablet; Refill: 1  2. Anxiety - stable, at baseline Refill: - diazepam (VALIUM) 2 MG tablet; Take 1 tablet (2 mg total) by mouth every 8 (eight) hours as needed for anxiety.  Dispense: 180 tablet; Refill: 1  3. Gastroesophageal reflux disease,  unspecified whether esophagitis present - stable, controlled on 20mg  daily prilosec Refill: - omeprazole (PRILOSEC) 20 MG capsule; Take 1 capsule (20 mg total) by mouth daily.  Dispense: 90 capsule; Refill: 3  4. Breast tenderness in female 5. Neuralgia of chest - symptoms x 2 mo - overlying skin is very sensitive to touch - no rash or skin changes, no palpable abnormality on exam other than pt is very tender to even light palpation - pt declines mammo saying she cannot tolerate d/t pain - US BREAST COMPLETE UNI LEFT INC AXILLA; Future Rx: - pregabalin (LYRICA) 50 MG capsule; Take 1 capsule (50 mg total) by mouth 2 (two) times daily.  Dispense: 180 capsule; Refill: 1  6. Chronic constipation 7. Incontinence of feces with fecal urgency - chronic - possible chronic constipation with overflow diarrhea/fecal incontinence - pt feels there is a "blockage" at times and often has to manually stimulate or disimpact herself - last colo in 2008 - Dr. Fuller Plan - done for same symptoms and overall normal - Ambulatory referral to Gastroenterology   This visit occurred during the SARS-CoV-2 public health emergency.  Safety protocols were in place, including screening questions prior to the visit, additional usage of staff PPE, and extensive cleaning of exam room while observing appropriate contact time as indicated for disinfecting solutions.

## 2021-01-21 ENCOUNTER — Other Ambulatory Visit: Payer: Self-pay | Admitting: Family Medicine

## 2021-01-21 DIAGNOSIS — N644 Mastodynia: Secondary | ICD-10-CM

## 2021-01-26 ENCOUNTER — Other Ambulatory Visit: Payer: Self-pay | Admitting: Family Medicine

## 2021-01-26 ENCOUNTER — Telehealth: Payer: Self-pay | Admitting: Family Medicine

## 2021-01-26 DIAGNOSIS — G250 Essential tremor: Secondary | ICD-10-CM

## 2021-01-26 DIAGNOSIS — F419 Anxiety disorder, unspecified: Secondary | ICD-10-CM

## 2021-01-26 NOTE — Telephone Encounter (Signed)
I spoke with pt and informed her that Dr. Loletha Grayer sent in that medication on the 01/19/21.  Pt said that she would call pharmacy and call me back.

## 2021-01-26 NOTE — Telephone Encounter (Signed)
Pt is saying she did not get her diazepam (VALIUM) 2 MG tablet [[id number]].  She did get the other 2. [place] also told her that there is no order in for Diazepam. She is very worried. Please advise pt

## 2021-02-02 NOTE — Telephone Encounter (Signed)
Pt called back stating [facility] did not receive electronic RX from [place] 7/7. They told her they faxed [place] a paper RX on 7/13. Pt is still w/o meds and they advised they cannot ship w/o proper RX for diazepam (VALIUM) 2 MG tablet.  [place], [place] Phone:  [phone]  Fax:  [phone]

## 2021-02-03 MED ORDER — DIAZEPAM 2 MG PO TABS: 2.0000 mg | ORAL_TABLET | Freq: Three times a day (TID) | ORAL | 1 refills | Status: DC | PRN

## 2021-02-03 NOTE — Telephone Encounter (Signed)
Prescription was printed and not sent electronically.   Can you please and thank you send it to [facility]? Thanks.  Dm/cma

## 2021-02-03 NOTE — Addendum Note (Signed)
Addended by: Ronnald Nian on: 02/03/2021 04:23 PM   Modules accepted: Orders

## 2021-02-03 NOTE — Telephone Encounter (Signed)
Lft Detailed VM that RX was sent to the pharmacy.  Dm/cma

## 2021-02-03 NOTE — Telephone Encounter (Signed)
done

## 2021-02-23 DIAGNOSIS — G250 Essential tremor: Secondary | ICD-10-CM | POA: Diagnosis not present

## 2021-02-28 ENCOUNTER — Ambulatory Visit
Admission: RE | Admit: 2021-02-28 | Discharge: 2021-02-28 | Disposition: A | Discharge status: Home / Self Care | Payer: Medicare Other | Source: Ambulatory Visit | Attending: Family Medicine | Admitting: Family Medicine

## 2021-02-28 ENCOUNTER — Other Ambulatory Visit: Payer: Self-pay

## 2021-02-28 ENCOUNTER — Encounter: Payer: Self-pay | Admitting: Family Medicine

## 2021-02-28 ENCOUNTER — Other Ambulatory Visit: Payer: Self-pay | Admitting: Family Medicine

## 2021-02-28 DIAGNOSIS — N644 Mastodynia: Secondary | ICD-10-CM

## 2021-02-28 DIAGNOSIS — N6310 Unspecified lump in the right breast, unspecified quadrant: Secondary | ICD-10-CM

## 2021-02-28 DIAGNOSIS — R928 Other abnormal and inconclusive findings on diagnostic imaging of breast: Secondary | ICD-10-CM

## 2021-02-28 DIAGNOSIS — C50911 Malignant neoplasm of unspecified site of right female breast: Secondary | ICD-10-CM | POA: Insufficient documentation

## 2021-02-28 DIAGNOSIS — R922 Inconclusive mammogram: Secondary | ICD-10-CM | POA: Diagnosis not present

## 2021-03-08 ENCOUNTER — Other Ambulatory Visit: Payer: Medicare Other

## 2021-03-15 ENCOUNTER — Other Ambulatory Visit: Payer: Medicare Other

## 2021-03-17 ENCOUNTER — Ambulatory Visit (INDEPENDENT_AMBULATORY_CARE_PROVIDER_SITE_OTHER): Payer: Medicare Other | Admitting: Family Medicine

## 2021-03-17 ENCOUNTER — Encounter: Payer: Self-pay | Admitting: Family Medicine

## 2021-03-17 ENCOUNTER — Other Ambulatory Visit: Payer: Self-pay

## 2021-03-17 VITALS — Temp 96.7°F | Ht 64.0 in | Wt 173.4 lb

## 2021-03-17 DIAGNOSIS — K219 Gastro-esophageal reflux disease without esophagitis: Secondary | ICD-10-CM

## 2021-03-17 DIAGNOSIS — G250 Essential tremor: Secondary | ICD-10-CM

## 2021-03-17 DIAGNOSIS — M5414 Radiculopathy, thoracic region: Secondary | ICD-10-CM | POA: Diagnosis not present

## 2021-03-17 DIAGNOSIS — R030 Elevated blood-pressure reading, without diagnosis of hypertension: Secondary | ICD-10-CM | POA: Diagnosis not present

## 2021-03-17 DIAGNOSIS — E559 Vitamin D deficiency, unspecified: Secondary | ICD-10-CM

## 2021-03-17 DIAGNOSIS — M19041 Primary osteoarthritis, right hand: Secondary | ICD-10-CM

## 2021-03-17 DIAGNOSIS — R945 Abnormal results of liver function studies: Secondary | ICD-10-CM

## 2021-03-17 DIAGNOSIS — Z13220 Encounter for screening for lipoid disorders: Secondary | ICD-10-CM

## 2021-03-17 DIAGNOSIS — E538 Deficiency of other specified B group vitamins: Secondary | ICD-10-CM

## 2021-03-17 DIAGNOSIS — M19042 Primary osteoarthritis, left hand: Secondary | ICD-10-CM

## 2021-03-17 DIAGNOSIS — N6310 Unspecified lump in the right breast, unspecified quadrant: Secondary | ICD-10-CM

## 2021-03-17 LAB — COMPREHENSIVE METABOLIC PANEL
ALT: 17 U/L (ref 0–35)
AST: 20 U/L (ref 0–37)
Albumin: 4.1 g/dL (ref 3.5–5.2)
Alkaline Phosphatase: 66 U/L (ref 39–117)
BUN: 12 mg/dL (ref 6–23)
CO2: 27 mEq/L (ref 19–32)
Calcium: 9.2 mg/dL (ref 8.4–10.5)
Chloride: 104 mEq/L (ref 96–112)
Creatinine, Ser: 0.74 mg/dL (ref 0.40–1.20)
GFR: 73.9 mL/min (ref >60.00)
Glucose, Bld: 90 mg/dL (ref 70–99)
Potassium: 4.6 mEq/L (ref 3.5–5.1)
Sodium: 139 mEq/L (ref 135–145)
Total Bilirubin: 0.5 mg/dL (ref 0.2–1.2)
Total Protein: 7 g/dL (ref 6.0–8.3)

## 2021-03-17 LAB — LIPID PANEL
Cholesterol: 255 mg/dL — ABNORMAL HIGH (ref 0–200)
HDL: 50.1 mg/dL (ref >39.00)
NonHDL: 204.61
Total CHOL/HDL Ratio: 5
Triglycerides: 258 mg/dL — ABNORMAL HIGH (ref 0.0–149.0)
VLDL: 51.6 mg/dL — ABNORMAL HIGH (ref 0.0–40.0)

## 2021-03-17 LAB — LDL CHOLESTEROL, DIRECT: Direct LDL: 143 mg/dL

## 2021-03-17 LAB — TSH: TSH: 2.6 u[IU]/mL (ref 0.35–5.50)

## 2021-03-17 LAB — CBC
HCT: 39.1 % (ref 36.0–46.0)
Hemoglobin: 13.3 g/dL (ref 12.0–15.0)
MCHC: 34 g/dL (ref 30.0–36.0)
MCV: 98 fl (ref 78.0–100.0)
Platelets: 176 10*3/uL (ref 150.0–400.0)
RBC: 3.99 Mil/uL (ref 3.87–5.11)
RDW: 13.2 % (ref 11.5–15.5)
WBC: 4.7 10*3/uL (ref 4.0–10.5)

## 2021-03-17 LAB — VITAMIN B12: Vitamin B-12: 237 pg/mL (ref 211–911)

## 2021-03-17 LAB — VITAMIN D 25 HYDROXY (VIT D DEFICIENCY, FRACTURES): VITD: 20.66 ng/mL — ABNORMAL LOW (ref 30.00–100.00)

## 2021-03-17 NOTE — Progress Notes (Signed)
[facility] [address] [address] Dept: [phone] Dept Fax: [phone]  Transfer of Care Office Visit  Subjective:    Patient ID: [name], female    DOB: [birth year], 87 y.o..   MRN: [id number]  Chief Complaint  Patient presents with   Transitions Of Care    Est care. Pt c/o pain in left breast that travels to back    History of Present Illness:  Patient is in today to establish care. [name] is originally from [place] and has lived here most of her life. She has been married 4 times. All of her prior husband's are deceased. Her last husband, she was married to for 21 years. He dies about 10 years ago from cancer. She has three children, from her first husband (ages 79, 33, and 10). Her youngest daughter has cerebral palsy and had recent surgery. [name] has been trying to assist in her care. [name] is retired from [employer]. She denies tobacco or drug use and only rarely drinks alcohol.  [name] has seasonal allergic rhinitis. She occasionally uses Zyrtec for this.  [name] has a history of sleep apnea. She did not tolerate using a CPAP, so this is currently not treated.    [name] has a history of IBS, with alternating diarrhea and constipation.She feels she is managing with this. She occasionally takes hyoscyamine for cramping.  [name] has a history of GERD and takes Prilosec for this. Her symptoms are managed well.  [name] has a history of an essential tremor. She notes that this has worsened over time, as she gets older. She takes primidone and diazepam for tremor management. She is taking two '2mg'$  diazepam in the morning and one later int he day.  [name] has had some mildly elevated blood pressures, but is not on current therapy.  [name]'s chart indicates a variety of prior issues, including iron overload, Vit D and Vit B12 deficiency, low bone mass, and  abnormal liver studies.  [name] had been seeing Dr. Bryan Lemma earlier this year with left breast pain. This began more int he upper outer breast, but has gradually migrated to the central breast. She notes that wearing a bra makes the pain worse. The discomfort is more of a burning sensation. She does note some pain int he left thoracic area, as well. She had an ultraound of her breasts recently that noted a suspicious mass int he right breast. She is scheduled for a biopsy of this.  Past Medical History: Patient Active Problem List   Diagnosis Date Noted   Breast mass, right 02/28/2021   B12 deficiency 05/05/2020   Vitamin D deficiency 08/05/2019   Elevated blood pressure reading without diagnosis of hypertension 04/30/2019   Allergic rhinitis 10/28/2018   OSA (obstructive sleep apnea) 11/28/2017   Iron overload 02/21/2016   Senile nuclear sclerosis 12/10/2014   Gait difficulty 03/27/2014   Low bone mass 10/31/2012   Gastro-esophageal reflux disease without esophagitis 06/25/2012   Fecal incontinence 06/25/2012   Thoracic radiculitis 07/20/2010   Diverticulosis of colon 09/26/2007   Depression with anxiety 09/26/2007   Enlarged lymph nodes 07/26/2007   History of colonic polyps 05/15/2007   Abnormal results of liver function studies 05/15/2007   Irritable bowel syndrome 05/15/2007   Tremor, essential 02/11/2007   Osteoarthritis of both hands 02/11/2007   Past Surgical History:  Procedure Laterality Date   arthroscopy left knee  arthroscopy right knee     bmp  07/17/2000   CATARACT EXTRACTION, BILATERAL     COLONOSCOPY W/ POLYPECTOMY     DILATION AND CURETTAGE OF UTERUS     RIGHT FOOT SURGERIES X 2     TOTAL KNEE ARTHROPLASTY  03/13/2012   Procedure: TOTAL KNEE ARTHROPLASTY;  Surgeon: Gearlean Alf, MD;  Location: [facility];  Service: Orthopedics;  Laterality: Left;  steroid injection right knee   TOTAL KNEE ARTHROPLASTY Right 08/04/2013   Procedure: RIGHT TOTAL KNEE  ARTHROPLASTY;  Surgeon: Gearlean Alf, MD;  Location: [facility];  Service: Orthopedics;  Laterality: Right;   Family History  Problem Relation Age of Onset   Stroke Mother    Kidney disease Father    Cancer Maternal Grandmother        Leukemia   Kidney failure Other        renal disease   Outpatient Medications Prior to Visit  Medication Sig Dispense Refill   diazepam (VALIUM) 2 MG tablet Take 1 tablet (2 mg total) by mouth every 8 (eight) hours as needed for anxiety. 180 tablet 1   MYSOLINE 250 MG tablet Take 250 mg by mouth 2 (two) times daily.     omeprazole (PRILOSEC) 20 MG capsule Take 1 capsule (20 mg total) by mouth daily. 90 capsule 3   Vitamin D, Ergocalciferol, (DRISDOL) 1.25 MG (50000 UNIT) CAPS capsule Take 1 capsule (50,000 Units total) by mouth every 7 (seven) days. 5 capsule 3   hyoscyamine (LEVSIN) 0.125 MG tablet Take 1 tablet (0.125 mg total) by mouth every 4 (four) hours as needed for cramping. (Patient not taking: Reported on 03/17/2021) 60 tablet 1   pregabalin (LYRICA) 50 MG capsule Take 1 capsule (50 mg total) by mouth 2 (two) times daily. 180 capsule 1   No facility-administered medications prior to visit.   Allergies  Allergen Reactions   Memantine Swelling   Propranolol Other (See Comments)    Made her head feel very funny Made her head feel very funny    Codeine Nausea And Vomiting   Sulfamethoxazole Rash    REACTION: unspecified    Objective:   Today's Vitals   03/17/21 1003  Temp: (!) 96.7 F (35.9 C)  TempSrc: Temporal  SpO2: 97%  Weight: 173 lb 6.4 oz (78.7 kg)  Height: '5\' 4"'$  (1.626 m)   Body mass index is 29.76 kg/m.   General: Well developed, well nourished. No acute distress. Chest: There is pain over the proximal rib to the left of the thoracic spine around the T9-10 level. Psych: Alert and oriented. Normal mood and affect.  Health Maintenance Due  Topic Date Due   Zoster Vaccines- Shingrix (1 of 2) Never done   TETANUS/TDAP   07/17/2002   COVID-19 Vaccine (3 - Booster for Pfizer series) 03/28/2020   INFLUENZA VACCINE  02/14/2021   Imaging Bilateral Breast Ultrasounds (02/28/2021) IMPRESSION: 1. 1.5 cm mass with imaging features highly suspicious for malignancy in the 12 o'clock position of the right breast. 2. 2 right axillary lymph nodes with borderline eccentric cortical thickening, suspicious for possible metastatic nodes. 3. No evidence of malignancy on the left.    Assessment & Plan:   1. Tremor, essential Continue primidone and diazepam. I will check her TSH in light of the progressive nature of the tremor.  - TSH  2. Gastro-esophageal reflux disease without esophagitis Stable on Prilosec.  3. Thoracic radiculitis The etiology of the left breast pain is unclear. Her symptoms are  suggestive of a neuritis, thought he level of the pain is not consistent with the new are of pain in the posterior thoracic area. I would like ot obtain a CT to rule-out possible bony metastases from the newly discovered suspicious right breast mass.  - CT THORACIC SPINE WO CONTRAST; Future  4. Primary osteoarthritis of both hands Stable.  5. Elevated blood pressure reading without diagnosis of hypertension Ms. Brande has some mild isolated systolic hypertension. We will monitor this for now.  6. Vitamin D deficiency I recommend we reassess her Vit D level to determine if she still needs a replacement dose of Vitamin D.  - VITAMIN D 25 Hydroxy (Vit-D Deficiency, Fractures)  7. B12 deficiency I recommend we reassess her B12 level. She was previously on injections.   - Vitamin B12  8. Breast mass, right Ms. Silva is scheduled for a breast biopsy. I will follow up with her after the pathology report returns.  9. Abnormal results of liver function studies Recommend we reassess her liver studies.  - Comprehensive metabolic panel  10. Iron overload Recommend we reassess her iron profile, as this could be a  cause for liver abnormalities.  - CBC - Iron, TIBC and Ferritin Panel  11. Screening for lipid disorders  - Lipid panel  [name], MD

## 2021-03-18 ENCOUNTER — Encounter: Payer: Self-pay | Admitting: Family Medicine

## 2021-03-18 DIAGNOSIS — E785 Hyperlipidemia, unspecified: Secondary | ICD-10-CM | POA: Insufficient documentation

## 2021-03-18 LAB — IRON,TIBC AND FERRITIN PANEL
%SAT: 54 % (calc) — ABNORMAL HIGH (ref 16–45)
Ferritin: 159 ng/mL (ref 16–288)
Iron: 126 ug/dL (ref 45–160)
TIBC: 234 mcg/dL (calc) — ABNORMAL LOW (ref 250–450)

## 2021-03-23 ENCOUNTER — Ambulatory Visit
Admission: RE | Admit: 2021-03-23 | Discharge: 2021-03-23 | Disposition: A | Discharge status: Home / Self Care | Payer: Medicare Other | Source: Ambulatory Visit | Attending: Family Medicine | Admitting: Family Medicine

## 2021-03-23 ENCOUNTER — Other Ambulatory Visit: Payer: Self-pay | Admitting: Family Medicine

## 2021-03-23 ENCOUNTER — Other Ambulatory Visit: Payer: Self-pay

## 2021-03-23 DIAGNOSIS — N6315 Unspecified lump in the right breast, overlapping quadrants: Secondary | ICD-10-CM | POA: Diagnosis not present

## 2021-03-23 DIAGNOSIS — R599 Enlarged lymph nodes, unspecified: Secondary | ICD-10-CM

## 2021-03-23 DIAGNOSIS — C50811 Malignant neoplasm of overlapping sites of right female breast: Secondary | ICD-10-CM | POA: Diagnosis not present

## 2021-03-23 DIAGNOSIS — Z170 Estrogen receptor positive status [ER+]: Secondary | ICD-10-CM | POA: Diagnosis not present

## 2021-03-23 DIAGNOSIS — R590 Localized enlarged lymph nodes: Secondary | ICD-10-CM | POA: Diagnosis not present

## 2021-03-23 DIAGNOSIS — N6310 Unspecified lump in the right breast, unspecified quadrant: Secondary | ICD-10-CM

## 2021-03-23 HISTORY — PX: BREAST BIOPSY: SHX20

## 2021-03-25 ENCOUNTER — Telehealth: Payer: Self-pay | Admitting: Hematology and Oncology

## 2021-03-25 NOTE — Telephone Encounter (Signed)
Spoke to patient to confirm morning clinic, will send paperwork via mail

## 2021-03-28 ENCOUNTER — Encounter: Payer: Self-pay | Admitting: *Deleted

## 2021-03-28 ENCOUNTER — Encounter: Payer: Self-pay | Admitting: Family Medicine

## 2021-03-28 ENCOUNTER — Other Ambulatory Visit: Payer: Self-pay | Admitting: *Deleted

## 2021-03-28 DIAGNOSIS — C50411 Malignant neoplasm of upper-outer quadrant of right female breast: Secondary | ICD-10-CM | POA: Insufficient documentation

## 2021-03-28 DIAGNOSIS — Z170 Estrogen receptor positive status [ER+]: Secondary | ICD-10-CM | POA: Insufficient documentation

## 2021-03-29 NOTE — Progress Notes (Signed)
Radiation Oncology         [phone] ________________________________  Multidisciplinary Breast Oncology Clinic [facility]) Initial Outpatient Consultation  Name: [name] MRN: [id number]  Date: 03/30/2021  DOB: 05-23-1934  DX:IPJA, Lillette Boxer, MD  Erroll Luna, MD   REFERRING PHYSICIAN: Erroll Luna, MD  DIAGNOSIS: The encounter diagnosis was Malignant neoplasm of upper-outer quadrant of right breast in female, estrogen receptor positive ([place]).  Stage I (cT1c, cN0, cM0) Right Breast UOQ, Invasive Ductal Carcinoma with low-grade DCIS, ER+ / PR+ / Her2-, Grade 2   HISTORY OF PRESENT ILLNESS::[name] is a 87 y.o. female who is presenting to the office today for evaluation of her newly diagnosed breast cancer. She is accompanied by her daughter. She is doing well overall.   She underwent bilateral diagnostic mammography with tomography and right breast ultrasonography at [facility] on 02/28/21 showing: a 1.5 cm mass, with imaging features highly suspicious of malignancy in the 12 o'clock position of the right breast. 2 right axillary lymph nodes were also visualized with cortical thickening; suspicious for possible metastatic nodes. No evidence of malignancy was seen within the left breast  Right breast biopsy at the 12:00 position; 3cmfn on 03/23/21 showed: grade 2 invasive ductal carcinoma; measuring 1.2 cm in the greatest linear dimension, with low grade ductal carcinoma in-situ. Right axillary lymph node biopsied was found to be benign.  Prognostic indicators significant for: estrogen receptor, 100% positive and progesterone receptor, 100% positive, both with strong staining intensity. Proliferation marker Ki67 at 1%. HER2 negative.  Menarche: 87 years old Age at first live birth: 87 years old GP: 3 LMP: when she was 87 years old  Contraceptive: none HRT: none   The patient was referred today for presentation in the multidisciplinary conference.  Radiology  studies and pathology slides were presented there for review and discussion of treatment options.  A consensus was discussed regarding potential next steps.  PREVIOUS RADIATION THERAPY: No  PAST MEDICAL HISTORY:  Past Medical History:  Diagnosis Date   Abnormal results of liver function studies 05/15/2007   Allergy    Anal fissure 09/26/2007   Formatting of this note might be different from the original. Qualifier: Diagnosis of  By: Nils Pyle CMA (AAMA), Leisha   Anxiety    B12 deficiency 05/05/2020   Chicken pox    Essential tremor    of head, sometimes hands.  pt takes primidone to help the tremors-DR. MILLER-NEUROLOGIST IN [place].  PT WAS STARTED ON VALUIM MANY YEARS AGO FOR HER TREMORS--AND CONTINUES TO TAKE.   Fracture of humerus, proximal, left, closed 10/28/2011   pt has finished physical therapy-limited ROM reaching to her back and unable to lift heavy things with left hand/arm   GERD (gastroesophageal reflux disease)    H/O hiatal hernia    Hereditary and idiopathic peripheral neuropathy 03/27/2014   Hx of colonic polyps    Osteoarthritis    PAIN AND OA BILATERAL KNEES; ALSO HAS ARTHRITIS IN HANDS AND BACK BUT NO BACK PAIN   Other chronic otitis externa 03/01/2009   Formatting of this note might be different from the original. Qualifier: Diagnosis of  By: Arnoldo Morale MD, Balinda Quails    PAST SURGICAL HISTORY: Past Surgical History:  Procedure Laterality Date   arthroscopy left knee     arthroscopy right knee     bmp  07/17/2000   BREAST BIOPSY Right 03/23/2021   x2   CATARACT EXTRACTION, BILATERAL     COLONOSCOPY W/ POLYPECTOMY  DILATION AND CURETTAGE OF UTERUS     RIGHT FOOT SURGERIES X 2     TOTAL KNEE ARTHROPLASTY  03/13/2012   Procedure: TOTAL KNEE ARTHROPLASTY;  Surgeon: Gearlean Alf, MD;  Location: [facility];  Service: Orthopedics;  Laterality: Left;  steroid injection right knee   TOTAL KNEE ARTHROPLASTY Right 08/04/2013   Procedure: RIGHT TOTAL KNEE ARTHROPLASTY;   Surgeon: Gearlean Alf, MD;  Location: [facility];  Service: Orthopedics;  Laterality: Right;    FAMILY HISTORY:  Family History  Problem Relation Age of Onset   Stroke Mother    Kidney disease Father    Cancer Maternal Grandmother        Leukemia   Kidney failure Other        renal disease    SOCIAL HISTORY:  Social History   Socioeconomic History   Marital status: Widowed    Spouse name: Not on file   Number of children: 3   Years of education: Not on file   Highest education level: Not on file  Occupational History   Not on file  Tobacco Use   Smoking status: Never   Smokeless tobacco: Never  Substance and Sexual Activity   Alcohol use: Yes    Comment: Rare   Drug use: No   Sexual activity: Not Currently  Other Topics Concern   Not on file  Social History Narrative   Not on file   Social Determinants of Health   Financial Resource Strain: Low Risk    Difficulty of Paying Living Expenses: Not very hard  Food Insecurity: No Food Insecurity   Worried About Running Out of Food in the Last Year: Never true   Ran Out of Food in the Last Year: Never true  Transportation Needs: No Transportation Needs   Lack of Transportation (Medical): No   Lack of Transportation (Non-Medical): No  Physical Activity: Inactive   Days of Exercise per Week: 0 days   Minutes of Exercise per Session: 0 min  Stress: No Stress Concern Present   Feeling of Stress : Only a little  Social Connections: Moderately Isolated   Frequency of Communication with Friends and Family: More than three times a week   Frequency of Social Gatherings with Friends and Family: Once a week   Attends Religious Services: 1 to 4 times per year   Active Member of [employer] or Organizations: No   Attends Archivist Meetings: Never   Marital Status: Widowed    ALLERGIES:  Allergies  Allergen Reactions   Memantine Swelling   Propranolol Other (See Comments)    Made her head feel very funny Made her  head feel very funny    Codeine Nausea And Vomiting   Sulfamethoxazole Rash    REACTION: unspecified    MEDICATIONS:  Current Outpatient Medications  Medication Sig Dispense Refill   diazepam (VALIUM) 2 MG tablet Take 1 tablet (2 mg total) by mouth every 8 (eight) hours as needed for anxiety. 180 tablet 1   hyoscyamine (LEVSIN) 0.125 MG tablet Take 1 tablet (0.125 mg total) by mouth every 4 (four) hours as needed for cramping. (Patient not taking: Reported on 03/17/2021) 60 tablet 1   MYSOLINE 250 MG tablet Take 250 mg by mouth 2 (two) times daily.     omeprazole (PRILOSEC) 20 MG capsule Take 1 capsule (20 mg total) by mouth daily. 90 capsule 3   Vitamin D, Ergocalciferol, (DRISDOL) 1.25 MG (50000 UNIT) CAPS capsule Take 1 capsule (50,000 Units total)  by mouth every 7 (seven) days. 5 capsule 3   No current facility-administered medications for this encounter.    REVIEW OF SYSTEMS: A 10+ POINT REVIEW OF SYSTEMS WAS OBTAINED including neurology, dermatology, psychiatry, cardiac, respiratory, lymph, extremities, GI, GU, musculoskeletal, constitutional, reproductive, HEENT. On the provided form, she reports stabbing left breast pain, dry cough, heartburn, hernia, abdominal pain, breast pain, forgetfulness, anxiety, and depression. She denies any other symptoms.    PHYSICAL EXAM:   Vitals with BMI 03/30/2021  Height _0   Weight 170 lbs 3 oz  BMI 09.2  Systolic 330  Diastolic 54  Pulse 66    Lungs are clear to auscultation bilaterally. Heart has regular rate and rhythm. No palpable cervical, supraclavicular, or axillary adenopathy. Abdomen soft, non-tender, normal bowel sounds. Breast: Left breast with no palpable mass, nipple discharge, or bleeding, patient has tenderness with palpation in the upper quadrant, but no mass with that noted. Right breast with bruising in the axillary and the upper aspect of the breast from her recent biopsies, some biopsy induration noted in the upper breast.    Palpation around the right upper back around inferior to the scapula shows no skin changes and no palpable mass.   KPS = 90  100 - Normal; no complaints; no evidence of disease. 90   - Able to carry on normal activity; minor signs or symptoms of disease. 80   - Normal activity with effort; some signs or symptoms of disease. 33   - Cares for self; unable to carry on normal activity or to do active work. 60   - Requires occasional assistance, but is able to care for most of his personal needs. 50   - Requires considerable assistance and frequent medical care. 72   - Disabled; requires special care and assistance. 63   - Severely disabled; hospital admission is indicated although death not imminent. 21   - Very sick; hospital admission necessary; active supportive treatment necessary. 10   - Moribund; fatal processes progressing rapidly. 0     - Dead  Karnofsky DA, Abelmann [place], Craver LS and Burchenal [facility] [phone]) The use of the nitrogen mustards in the palliative treatment of carcinoma: with particular reference to bronchogenic carcinoma Cancer 1 634-56  LABORATORY DATA:  Lab Results  Component Value Date   WBC 5.0 03/30/2021   HGB 13.8 03/30/2021   HCT 39.6 03/30/2021   MCV 95.2 03/30/2021   PLT 168 03/30/2021   Lab Results  Component Value Date   NA 139 03/30/2021   K 4.3 03/30/2021   CL 104 03/30/2021   CO2 26 03/30/2021   Lab Results  Component Value Date   ALT 25 03/30/2021   AST 21 03/30/2021   ALKPHOS 70 03/30/2021   BILITOT 0.5 03/30/2021    PULMONARY FUNCTION TEST:   Recent Review Flowsheet Data   There is no flowsheet data to display.     RADIOGRAPHY: [place] AXILLARY NODE CORE BIOPSY RIGHT  Addendum Date: 03/24/2021   ADDENDUM REPORT: 03/24/2021 14:28 ADDENDUM: Pathology revealed GRADE II INVASIVE DUCTAL CARCINOMA, LOW GRADE DUCTAL CARCINOMA IN SITU of the RIGHT breast, 12:00 o'clock, 3cmfn, ribbon clip. This was found to be concordant by Dr. Ileana Roup.  Pathology revealed BENIGN LYMPH NODE of the RIGHT axilla, Q clip. This was found to be concordant by Dr. Ileana Roup. Pathology results were discussed with the patient by telephone. The patient reported doing well after the biopsies with tenderness at the sites. Post biopsy instructions and care  were reviewed and questions were answered. The patient was encouraged to call [facility] for any additional concerns. The patient was referred to [facility] on March 30, 2021. Pathology results reported by Stacie Acres RN on 03/24/2021. Electronically Signed   By: Ileana Roup M.D.   On: 03/24/2021 14:28   Result Date: 03/24/2021 CLINICAL DATA:  Right axillary lymph node with borderline cortical thickening EXAM: [place] AXILLARY NODE CORE BIOPSY RIGHT COMPARISON:  Previous exam(s). PROCEDURE: I met with the patient and we discussed the procedure of ultrasound-guided biopsy, including benefits and alternatives. We discussed the high likelihood of a successful procedure. We discussed the risks of the procedure, including infection, bleeding, tissue injury, clip migration, and inadequate sampling. Informed written consent was given. The usual time-out protocol was performed immediately prior to the procedure. Using sterile technique and 1% Lidocaine as local anesthetic, under direct ultrasound visualization, a 14 gauge spring-loaded device was used to perform biopsy of an axillary lymph node with borderline cortical thickening using an inferior approach. At the conclusion of the procedure a Q shaped tissue marker clip was deployed into the biopsy cavity. Follow up 2 view mammogram was performed and dictated separately. IMPRESSION: Ultrasound guided biopsy of right axillary lymph node. No apparent complications. Electronically Signed: By: Ileana Roup M.D. On: 03/23/2021 15:23  MM CLIP PLACEMENT RIGHT  Result Date:  03/23/2021 CLINICAL DATA:  Postprocedure mammogram. EXAM: 3D DIAGNOSTIC RIGHT MAMMOGRAM POST ULTRASOUND BIOPSY COMPARISON:  Previous exam(s). FINDINGS: 3D Mammographic images were obtained following ultrasound guided biopsy of mass at the right breast 12 o'clock position and right axillary lymph node. The ribbon biopsy marking clip is in expected position at the site of biopsy at the right breast 12 o'clock position. The Q shaped biopsy clip in appropriate position within the right axilla. IMPRESSION: 1. Appropriate positioning of the ribbon shaped biopsy marking clip at the site of biopsy in the right breast 12 o'clock position middle depth. 2. Appropriate positioning of the Q shaped biopsy marking clip at the site of biopsy in the right axilla. Final Assessment: Post Procedure Mammograms for Marker Placement Electronically Signed   By: Ileana Roup M.D.   On: 03/23/2021 15:19  [place] RT BREAST BX W LOC DEV 1ST LESION IMG BX SPEC US GUIDE  Addendum Date: 03/24/2021   ADDENDUM REPORT: 03/24/2021 14:27 ADDENDUM: Pathology revealed GRADE II INVASIVE DUCTAL CARCINOMA, LOW GRADE DUCTAL CARCINOMA IN SITU of the RIGHT breast, 12:00 o'clock, 3cmfn, ribbon clip. This was found to be concordant by Dr. Ileana Roup. Pathology revealed BENIGN LYMPH NODE of the RIGHT axilla, Q clip. This was found to be concordant by Dr. Ileana Roup. Pathology results were discussed with the patient by telephone. The patient reported doing well after the biopsies with tenderness at the sites. Post biopsy instructions and care were reviewed and questions were answered. The patient was encouraged to call [facility] for any additional concerns. The patient was referred to [facility] on March 30, 2021. Pathology results reported by Stacie Acres RN on 03/24/2021. Electronically Signed   By: Ileana Roup M.D.   On: 03/24/2021 14:27   Result Date:  03/24/2021 CLINICAL DATA:  Suspicious mass at the right breast 12 o'clock position. EXAM: ULTRASOUND GUIDED RIGHT BREAST CORE NEEDLE BIOPSY COMPARISON:  Previous exam(s). PROCEDURE: I met with the patient and we discussed  the procedure of ultrasound-guided biopsy, including benefits and alternatives. We discussed the high likelihood of a successful procedure. We discussed the risks of the procedure, including infection, bleeding, tissue injury, clip migration, and inadequate sampling. Informed written consent was given. The usual time-out protocol was performed immediately prior to the procedure. Lesion quadrant: Upper central breast, 12 o'clock position Using sterile technique and 1% Lidocaine as local anesthetic, under direct ultrasound visualization, a 12 gauge spring-loaded device was used to perform biopsy of a 1.5 cm mass at the right breast 12 o'clock position 3 cm from nipple using an inferolateral approach. At the conclusion of the procedure a ribbon shaped tissue marker clip was deployed into the biopsy cavity. Follow up 2 view mammogram was performed and dictated separately. IMPRESSION: Ultrasound guided biopsy of mass at the right breast 12 o'clock position. No apparent complications. Electronically Signed: By: Ileana Roup M.D. On: 03/23/2021 15:22     IMPRESSION: Stage I (cT1c, cN0, cM0) Right Breast UOQ, Invasive Ductal Carcinoma with low-grade DCIS, ER+ / PR+ / Her2-, Grade 2   Patient will be a good candidate for breast conservation with radiotherapy to right breast. We discussed the general course of radiation, potential side effects, and toxicities with radiation and the patient is interested in this approach.   The patient will be having a CT scan of the chest Sep 26, ordered by her PCP to evaluate her left breast and upper back pain.    PLAN:  Chest CT scan as above Right lumpectomy with sentinel lymph node biopsy  +/- Adjuvant radiation therapy depending on final path. Aromatase  Inhibitor    ------------------------------------------------  Blair Promise, PhD, MD  This document serves as a record of services personally performed by Gery Pray, MD. It was created on his behalf by Roney Mans, a trained medical scribe. The creation of this record is based on the scribe's personal observations and the provider's statements to them. This document has been checked and approved by the attending provider.

## 2021-03-30 ENCOUNTER — Ambulatory Visit: Payer: Medicare Other | Attending: Surgery | Admitting: Physical Therapy

## 2021-03-30 ENCOUNTER — Encounter: Payer: Self-pay | Admitting: Physical Therapy

## 2021-03-30 ENCOUNTER — Encounter: Payer: Self-pay | Admitting: *Deleted

## 2021-03-30 ENCOUNTER — Inpatient Hospital Stay (HOSPITAL_BASED_OUTPATIENT_CLINIC_OR_DEPARTMENT_OTHER): Payer: Medicare Other | Admitting: Hematology

## 2021-03-30 ENCOUNTER — Ambulatory Visit
Admission: RE | Admit: 2021-03-30 | Discharge: 2021-03-30 | Disposition: A | Discharge status: Home / Self Care | Payer: Medicare Other | Source: Ambulatory Visit | Attending: Radiation Oncology | Admitting: Radiation Oncology

## 2021-03-30 ENCOUNTER — Ambulatory Visit: Payer: Self-pay | Admitting: Surgery

## 2021-03-30 ENCOUNTER — Encounter: Payer: Self-pay | Admitting: Hematology

## 2021-03-30 ENCOUNTER — Other Ambulatory Visit: Payer: Self-pay

## 2021-03-30 ENCOUNTER — Inpatient Hospital Stay: Payer: Medicare Other

## 2021-03-30 VITALS — BP 112/54 | HR 66 | Temp 97.8°F | Resp 18 | Ht 64.0 in | Wt 170.2 lb

## 2021-03-30 DIAGNOSIS — G250 Essential tremor: Secondary | ICD-10-CM

## 2021-03-30 DIAGNOSIS — Z96653 Presence of artificial knee joint, bilateral: Secondary | ICD-10-CM | POA: Insufficient documentation

## 2021-03-30 DIAGNOSIS — M8580 Other specified disorders of bone density and structure, unspecified site: Secondary | ICD-10-CM

## 2021-03-30 DIAGNOSIS — Z170 Estrogen receptor positive status [ER+]: Secondary | ICD-10-CM

## 2021-03-30 DIAGNOSIS — C50411 Malignant neoplasm of upper-outer quadrant of right female breast: Secondary | ICD-10-CM

## 2021-03-30 DIAGNOSIS — R0789 Other chest pain: Secondary | ICD-10-CM | POA: Insufficient documentation

## 2021-03-30 DIAGNOSIS — C50911 Malignant neoplasm of unspecified site of right female breast: Secondary | ICD-10-CM

## 2021-03-30 DIAGNOSIS — R293 Abnormal posture: Secondary | ICD-10-CM

## 2021-03-30 DIAGNOSIS — Z79899 Other long term (current) drug therapy: Secondary | ICD-10-CM

## 2021-03-30 DIAGNOSIS — C801 Malignant (primary) neoplasm, unspecified: Secondary | ICD-10-CM

## 2021-03-30 LAB — CBC WITH DIFFERENTIAL (CANCER CENTER ONLY)
Abs Immature Granulocytes: 0.02 10*3/uL (ref 0.00–0.07)
Basophils Absolute: 0 10*3/uL (ref 0.0–0.1)
Basophils Relative: 1 %
Eosinophils Absolute: 0.1 10*3/uL (ref 0.0–0.5)
Eosinophils Relative: 2 %
HCT: 39.6 % (ref 36.0–46.0)
Hemoglobin: 13.8 g/dL (ref 12.0–15.0)
Immature Granulocytes: 0 %
Lymphocytes Relative: 46 %
Lymphs Abs: 2.3 10*3/uL (ref 0.7–4.0)
MCH: 33.2 pg (ref 26.0–34.0)
MCHC: 34.8 g/dL (ref 30.0–36.0)
MCV: 95.2 fL (ref 80.0–100.0)
Monocytes Absolute: 0.4 10*3/uL (ref 0.1–1.0)
Monocytes Relative: 8 %
Neutro Abs: 2.1 10*3/uL (ref 1.7–7.7)
Neutrophils Relative %: 43 %
Platelet Count: 168 10*3/uL (ref 150–400)
RBC: 4.16 MIL/uL (ref 3.87–5.11)
RDW: 12.2 % (ref 11.5–15.5)
WBC Count: 5 10*3/uL (ref 4.0–10.5)
nRBC: 0 % (ref 0.0–0.2)

## 2021-03-30 LAB — CMP (CANCER CENTER ONLY)
ALT: 25 U/L (ref 0–44)
AST: 21 U/L (ref 15–41)
Albumin: 4 g/dL (ref 3.5–5.0)
Alkaline Phosphatase: 70 U/L (ref 38–126)
Anion gap: 9 (ref 5–15)
BUN: 12 mg/dL (ref 8–23)
CO2: 26 mmol/L (ref 22–32)
Calcium: 9.4 mg/dL (ref 8.9–10.3)
Chloride: 104 mmol/L (ref 98–111)
Creatinine: 0.85 mg/dL (ref 0.44–1.00)
GFR, Estimated: >60 mL/min (ref >60)
Glucose, Bld: 95 mg/dL (ref 70–99)
Potassium: 4.3 mmol/L (ref 3.5–5.1)
Sodium: 139 mmol/L (ref 135–145)
Total Bilirubin: 0.5 mg/dL (ref 0.3–1.2)
Total Protein: 7.3 g/dL (ref 6.5–8.1)

## 2021-03-30 LAB — GENETIC SCREENING ORDER

## 2021-03-30 NOTE — Therapy (Signed)
[place], [place], [address] Phone: [phone]   Fax:  [phone]  Physical Therapy Evaluation  Patient Details  Name: [name] MRN: [id number] Date of Birth: [birth year] Referring Provider (PT): Dr. Autumn Messing   Encounter Date: 03/30/2021   PT End of Session - 03/30/21 1353     Visit Number 1    Number of Visits 2    Date for PT Re-Evaluation 05/25/21    PT Start Time 1016    PT Stop Time 9201   Also saw pt from [id number] for a total of 24 min   PT Time Calculation (min) 16 min    Activity Tolerance Patient tolerated treatment well    Behavior During Therapy [facility] for tasks assessed/performed             Past Medical History:  Diagnosis Date   Abnormal results of liver function studies 05/15/2007   Allergy    Anal fissure 09/26/2007   Formatting of this note might be different from the original. Qualifier: Diagnosis of  By: Nils Pyle CMA (AAMA), Leisha   Anxiety    B12 deficiency 05/05/2020   Chicken pox    Essential tremor    of head, sometimes hands.  pt takes primidone to help the tremors-DR. MILLER-NEUROLOGIST IN [place].  PT WAS STARTED ON VALUIM MANY YEARS AGO FOR HER TREMORS--AND CONTINUES TO TAKE.   Fracture of humerus, proximal, left, closed 10/28/2011   pt has finished physical therapy-limited ROM reaching to her back and unable to lift heavy things with left hand/arm   GERD (gastroesophageal reflux disease)    H/O hiatal hernia    Hereditary and idiopathic peripheral neuropathy 03/27/2014   Hx of colonic polyps    Osteoarthritis    PAIN AND OA BILATERAL KNEES; ALSO HAS ARTHRITIS IN HANDS AND BACK BUT NO BACK PAIN   Other chronic otitis externa 03/01/2009   Formatting of this note might be different from the original. Qualifier: Diagnosis of  By: Arnoldo Morale MD, Balinda Quails    Past Surgical History:  Procedure Laterality Date   arthroscopy left knee     arthroscopy right knee      bmp  07/17/2000   BREAST BIOPSY Right 03/23/2021   x2   CATARACT EXTRACTION, BILATERAL     COLONOSCOPY W/ POLYPECTOMY     DILATION AND CURETTAGE OF UTERUS     RIGHT FOOT SURGERIES X 2     TOTAL KNEE ARTHROPLASTY  03/13/2012   Procedure: TOTAL KNEE ARTHROPLASTY;  Surgeon: Gearlean Alf, MD;  Location: [facility];  Service: Orthopedics;  Laterality: Left;  steroid injection right knee   TOTAL KNEE ARTHROPLASTY Right 08/04/2013   Procedure: RIGHT TOTAL KNEE ARTHROPLASTY;  Surgeon: Gearlean Alf, MD;  Location: [facility];  Service: Orthopedics;  Laterality: Right;    There were no vitals filed for this visit.    Subjective Assessment - 03/30/21 1333     Subjective Patient reports she is here today to be seen by her medical team for her newly diagnosed right breast cancer.    Patient is accompained by: Family member    Pertinent History Patient was diagnosed on 02/28/2021 with right grade II invasive ductal carcinoma breast cancer. It measures 1.5 cm and is located in the upper outer quadrant. It is ER/PR positive and HER2 negative with a Ki67 of 1%. She had bilateral knee replacements 10 years ago.    Patient Stated Goals Reduce lymphedema risk and learn  post op shoulder ROM HEP    Currently in Pain? Yes    Pain Score 4     Pain Location Breast    Pain Orientation Left    Pain Descriptors / Indicators Aching;Tender    Pain Type Acute pain    Pain Radiating Towards Left side of her back    Pain Onset More than a month ago    Pain Frequency Intermittent    Aggravating Factors  Touching the area; unable to wear a bra    Pain Relieving Factors not wearing a bra                [facility] PT Assessment - 03/30/21 0001       Assessment   Medical Diagnosis Right breast cancer    Referring Provider (PT) Dr. Autumn Messing    Onset Date/Surgical Date 02/28/21    Hand Dominance Right    Prior Therapy none      Precautions   Precautions Other (comment)    Precaution Comments active cancer       Restrictions   Weight Bearing Restrictions No      Balance Screen   Has the patient fallen in the past 6 months No    Has the patient had a decrease in activity level because of a fear of falling?  No    Is the patient reluctant to leave their home because of a fear of falling?  No      Home Environment   Living Environment Private residence    Living Arrangements Alone    Available Help at Discharge Family      Prior Function   Level of [place] Retired    Leisure She does not exercise      Cognition   Overall Cognitive Status Within Functional Limits for tasks assessed      Posture/Postural Control   Posture/Postural Control Postural limitations    Postural Limitations Rounded Shoulders;Forward head;Increased thoracic kyphosis      ROM / Strength   AROM / PROM / Strength AROM;Strength      AROM   Overall AROM Comments Bilateral cervical rotation limited 25%; others WNL    AROM Assessment Site Shoulder    Right/Left Shoulder Right;Left    Right Shoulder Extension 42 Degrees    Right Shoulder Flexion 151 Degrees    Right Shoulder ABduction 159 Degrees    Right Shoulder Internal Rotation 61 Degrees    Right Shoulder External Rotation 89 Degrees    Left Shoulder Extension 40 Degrees    Left Shoulder Flexion 135 Degrees    Left Shoulder ABduction 147 Degrees    Left Shoulder Internal Rotation 70 Degrees    Left Shoulder External Rotation 77 Degrees      Strength   Overall Strength Within functional limits for tasks performed               LYMPHEDEMA/ONCOLOGY QUESTIONNAIRE - 03/30/21 0001       Type   Cancer Type Right breast cancer      Lymphedema Assessments   Lymphedema Assessments Upper extremities      Right Upper Extremity Lymphedema   10 cm Proximal to Olecranon Process 27.4 cm    Olecranon Process 24.3 cm    10 cm Proximal to Ulnar Styloid Process 21.8 cm    Just Proximal to Ulnar Styloid Process 15.7 cm     Across Hand at [employer] 18.7 cm    At  Base of 2nd Digit 6.2 cm      Left Upper Extremity Lymphedema   10 cm Proximal to Olecranon Process 28.9 cm    Olecranon Process 25.7 cm    10 cm Proximal to Ulnar Styloid Process 20.9 cm    Just Proximal to Ulnar Styloid Process 15.4 cm    Across Hand at [employer] 18.1 cm    At [place] of 2nd Digit 5.9 cm             L-DEX FLOWSHEETS - 03/30/21 1300       L-DEX LYMPHEDEMA SCREENING   Measurement Type Unilateral    L-DEX MEASUREMENT EXTREMITY Upper Extremity    POSITION  Standing    DOMINANT SIDE Right    At Risk Side Right    BASELINE SCORE (UNILATERAL) 3.9             The patient was assessed using the L-Dex machine today to produce a lymphedema index baseline score. The patient will be reassessed on a regular basis (typically every 3 months) to obtain new L-Dex scores. If the score is > 6.5 points away from his/her baseline score indicating onset of subclinical lymphedema, it will be recommended to wear a compression garment for 4 weeks, 12 hours per day and then be reassessed. If the score continues to be > 6.5 points from baseline at reassessment, we will initiate lymphedema treatment. Assessing in this manner has a 95% rate of preventing clinically significant lymphedema.      Katina Dung - 03/30/21 0001     Open a tight or new jar Moderate difficulty    Do heavy household chores (wash walls, wash floors) Moderate difficulty    Carry a shopping bag or briefcase Moderate difficulty    Wash your back Moderate difficulty    Use a knife to cut food Mild difficulty    Recreational activities in which you take some force or impact through your arm, shoulder, or hand (golf, hammering, tennis) Moderate difficulty    During the past week, to what extent has your arm, shoulder or hand problem interfered with your normal social activities with family, friends, neighbors, or groups? Not at all    During the past week, to what  extent has your arm, shoulder or hand problem limited your work or other regular daily activities Not at all    Arm, shoulder, or hand pain. None    Tingling (pins and needles) in your arm, shoulder, or hand None    Difficulty Sleeping No difficulty    DASH Score 25 %              Objective measurements completed on examination: See above findings.          Patient was instructed today in a home exercise program today for post op shoulder range of motion. These included active assist shoulder flexion in sitting, scapular retraction, wall walking with shoulder abduction, and hands behind head external rotation.  She was encouraged to do these twice a day, holding 3 seconds and repeating 5 times when permitted by her physician.        PT Education - 03/30/21 1340     Education Details Lymphedema education and post op HEP    Person(s) Educated Patient;Other (comment)   daughter   Methods Explanation;Demonstration;Handout    Comprehension Returned demonstration;Verbalized understanding                 PT Long Term Goals - 03/30/21 1356  PT LONG TERM GOAL #1   Title Patient will demonstrate she has regained full shoulder ROM and function post operatively compared to baselines.    Time 8    Period Weeks    Status New    Target Date 05/25/21             Breast Clinic Goals - 03/30/21 1356       Patient will be able to verbalize understanding of pertinent lymphedema risk reduction practices relevant to her diagnosis specifically related to skin care.   Time 1    Period Days    Status Achieved      Patient will be able to return demonstrate and/or verbalize understanding of the post-op home exercise program related to regaining shoulder range of motion.   Time 1    Period Days    Status Achieved      Patient will be able to verbalize understanding of the importance of attending the postoperative After Breast Cancer Class for further lymphedema risk  reduction education and therapeutic exercise.   Time 1    Period Days    Status Achieved                   Plan - 03/30/21 1354     Clinical Impression Statement Patient was diagnosed on 02/28/2021 with right grade II invasive ductal carcinoma breast cancer. It measures 1.5 cm and is located in the upper outer quadrant. It is ER/PR positive and HER2 negative with a Ki67 of 1%. She had bilateral knee replacements 10 years ago. Her multidisciplinary medical team met prior to her assessments to determine a recommended treatment plan. She is planning to have a right lumpectomy and sentinel node biopsy, possible radiation, and anti-estrogen therapy. She will benefit from a post op PT reassessment to determine needs and from L-Dex screens every 3 months for 2 years to detect subclinical lymphedema.    Stability/Clinical Decision Making Stable/Uncomplicated    Clinical Decision Making Low    Rehab Potential Excellent    PT Frequency --   Eval and 1 f/u visit   PT Treatment/Interventions ADLs/Self Care Home Management;Therapeutic exercise;Patient/family education    PT Next Visit Plan Will reassess 3-4 weeks post op to determine needs    PT Home Exercise Plan Post op shoulder ROM HEP    Consulted and Agree with Plan of Care Patient;Family member/caregiver    Family Member Consulted daughter             Patient will benefit from skilled therapeutic intervention in order to improve the following deficits and impairments:  Postural dysfunction, Decreased range of motion, Decreased knowledge of precautions, Impaired UE functional use, Pain  Visit Diagnosis: Malignant neoplasm of upper-outer quadrant of right breast in female, estrogen receptor positive ([place]) - Plan: PT plan of care cert/re-cert  Abnormal posture - Plan: PT plan of care cert/re-cert  Patient will follow up at outpatient cancer rehab 3-4 weeks following surgery.  If the patient requires physical therapy at that time, a  specific plan will be dictated and sent to the referring physician for approval. The patient was educated today on appropriate basic range of motion exercises to begin post operatively and the importance of attending the After Breast Cancer class following surgery.  Patient was educated today on lymphedema risk reduction practices as it pertains to recommendations that will benefit the patient immediately following surgery.  She verbalized good understanding.      Problem List Patient Active Problem List  Diagnosis Date Noted   Malignant neoplasm of upper-outer quadrant of right breast in female, estrogen receptor positive ([place]) 03/28/2021   Hyperlipidemia 03/18/2021   Vitamin D deficiency 08/05/2019   Elevated blood pressure reading without diagnosis of hypertension 04/30/2019   Allergic rhinitis 10/28/2018   OSA (obstructive sleep apnea) 11/28/2017   Iron overload 02/21/2016   Senile nuclear sclerosis 12/10/2014   Gait difficulty 03/27/2014   Low bone mass 10/31/2012   Gastro-esophageal reflux disease without esophagitis 06/25/2012   Fecal incontinence 06/25/2012   Thoracic radiculitis 07/20/2010   Diverticulosis of colon 09/26/2007   Depression with anxiety 09/26/2007   Enlarged lymph nodes 07/26/2007   History of colonic polyps 05/15/2007   Irritable bowel syndrome 05/15/2007   Tremor, essential 02/11/2007   Osteoarthritis of both hands 02/11/2007   Annia Friendly, PT 03/30/21 1:59 PM   [place], [place], [address] Phone: [phone]   Fax:  [phone]  Name: [name] MRN: [id number] Date of Birth: November 10, 1933

## 2021-03-30 NOTE — Progress Notes (Signed)
[place]   [phone] Fax:([phone]) [place] Note   Patient Care Team: [name], MD as PCP - General (Family Medicine) Janace Litten, MD as Referring Physician (Neurology) Roel Cluck, MD as Referring Physician (Ophthalmology) Sabra Heck Precious Haws, MD as Referring Physician (Neurology) Rozetta Nunnery, MD as Consulting Physician (Otolaryngology) Erroll Luna, MD as Consulting Physician (General Surgery) Truitt Merle, MD as Consulting Physician (Hematology) Gery Pray, MD as Consulting Physician (Radiation Oncology) Mauro Kaufmann, RN as Oncology Nurse Navigator Rockwell Germany, RN as Oncology Nurse Navigator  Date of Service:  03/30/2021   CHIEF COMPLAINTS/PURPOSE OF CONSULTATION:  Right Breast Cancer, ER+  REFERRING PHYSICIAN:  [facility]   ASSESSMENT & PLAN:  [name] is a 87 y.o. postmenopausal female with a history of   1. Malignant neoplasm of upper-outer quadrant of right breast, Stage IA, c(T1c, N0), ER+/PR+/HER2-, Grade 2 -presented with left breast pain, mammogram showed 1.5 cm right breast mass at 12 o'clock and two suspicious lymph nodes. Biopsy on 03/23/21 showed: invasive ductal carcinoma, grade 2; and DCIS, low grade; lymph node biopsy was benign. --We discussed her imaging findings and the biopsy results in great details. -Given the early stage disease, she likely need a lumpectomy. She is agreeable with that. She was seen by Dr. Brantley Stage today and likely will proceed with surgery soon.  -I do not recommend Oncotype Dx test given her age and early stage disease. -Giving the strong ER and PR expression in her postmenopausal status, I recommend adjuvant endocrine therapy with aromatase inhibitor for a total of 5 years to reduce the risk of cancer recurrence.  -The potential benefit and side effects, which includes but not limited to, hot flash, skin and vaginal dryness, metabolic changes  ( increased blood glucose, cholesterol, weight, etc.), slightly in increased risk of cardiovascular disease, cataracts, muscular and joint discomfort, osteopenia and osteoporosis, etc, were discussed with her in great details. She is interested, and we'll start after surgery or when she completes radiation if radiation is recommended. -She was also seen by radiation oncologist Dr. Sondra Come today.  It is reasonable to forego adjuvant radiation. -We also discussed the breast cancer surveillance after her surgery. She will continue annual screening mammogram, self exam, and a routine office visit with lab and exam with [place]. -I encouraged her to have healthy diet and exercise regularly.   2. Osteopenia -Her most recent DEXA was 04/13/17 showing osteopenia (T-score of -1.7) -She is taking vit D.  3. Left chest pain and tenderness  -uncertain of etiology -She is scheduled for follow-up CT thoracic spine on September 26   PLAN:  -proceed with lumpectomy under Dr. Brantley Stage -I will see her after surgery   Oncology History Overview Note  Cancer Staging Malignant neoplasm of upper-outer quadrant of right breast in female, estrogen receptor positive ([place]) Staging form: Breast, AJCC 8th Edition - Clinical stage from 03/23/2021: cT1c, cN0, cM0, G2 - Signed by Truitt Merle, MD on 03/29/2021    Malignant neoplasm of upper-outer quadrant of right breast in female, estrogen receptor positive ([facility])  02/28/2021 Mammogram   Bilateral Diagnostic and Bilateral Breast Ultrasound  IMPRESSION: 1. 1.5 cm mass with imaging features highly suspicious for malignancy in the 12 o'clock position of the right breast. 2. 2 right axillary lymph nodes with borderline eccentric cortical thickening, suspicious for possible metastatic nodes. 3. No evidence of malignancy on the left.   03/23/2021 Cancer Staging   Staging form:  Breast, AJCC 8th Edition - Clinical stage from 03/23/2021: cT1c, cN0, cM0, G2 - Signed by Truitt Merle, MD on  03/29/2021 Stage prefix: Initial diagnosis Histologic grading system: 3 grade system   03/23/2021 Pathology Results   Diagnosis 1. Breast, right, needle core biopsy, 12:00 3cm fn, ribbon clip - INVASIVE DUCTAL CARCINOMA. SEE NOTE - DUCTAL CARCINOMA IN SITU, LOW-GRADE 2. Lymph node, needle/core biopsy, right axilla, q clip - BENIGN LYMPH NODE Diagnosis Note 1. Carcinoma measures 1.2 cm in greatest linear dimension and appears grade 2.  Prognostic panel pending.   03/28/2021 Initial Diagnosis   Malignant neoplasm of upper-outer quadrant of right breast in female, estrogen receptor positive ([place])      HISTORY OF PRESENTING ILLNESS:  [name] 87 y.o. female is a here because of breast cancer. The patient was [facility] by [facility]. The patient presents to the clinic today accompanied by her daughter Jenny Reichmann.   She presented with diffuse, non-focal pain across the upper and outer left breast extending into the upper back. She underwent bilateral diagnostic mammography and bilateral breast ultrasonography on 02/28/21 showing: 1.5 cm mass in right breast at 12 o'clock; 2 right axillary lymph nodes with borderline eccentric cortical thickening; no evidence of malignancy on left.  Biopsy on 03/23/21 showed: invasive ductal carcinoma, grade 2; DCIS, low grade; benign lymph node. Prognostic indicators significant for: estrogen receptor positive ,and progesterone receptor positive. Proliferation marker Ki67 at 1%. HER2 negative.    Today the patient notes they felt/feeling prior/after... -she reports constant left breast ache. She notes she does not wear a bra because of this. She denies pain or discomfort to her right breast.   She has a PMHx of.... -s/p bilateral knee replacement -essential tremor -s/p 2 or 3 DNC's, no hysterectomy -arthritis -hiatal hernia -s/p right foot surgery multiple times -h/o shingles to her butt.   Socially... -she is widowed since her husband died  of lung cancer from heavy smoking. -3 children -leukemia in her grandmother -h/o alcohol use (borderline abuse), now occasional. Never smoker.   GYN HISTORY  Menarchal: 87 years old LMP: ~age [age], some hot flashes Contraceptive: none HRT: none GP: 3, first at age 47   REVIEW OF SYSTEMS:    Constitutional: Denies fevers, chills or abnormal night sweats Eyes: Denies blurriness of vision, double vision or watery eyes Ears, nose, mouth, throat, and face: Denies mucositis or sore throat Respiratory: Denies dyspnea or wheezes, (+) dry cough Cardiovascular: Denies palpitation, chest discomfort or lower extremity swelling Gastrointestinal:  Denies nausea or change in bowel habits, (+) heartburn, (+) hernia, (+) abdominal pain Skin: Denies abnormal skin rashes Lymphatics: Denies new lymphadenopathy or easy bruising Neurological:Denies numbness, tingling or new weaknesses, (+) forgetfulness Behavioral/Psych: (+) anxiety, (+) depression  All other systems were reviewed with the patient and are negative.   MEDICAL HISTORY:  Past Medical History:  Diagnosis Date   Abnormal results of liver function studies 05/15/2007   Allergy    Anal fissure 09/26/2007   Formatting of this note might be different from the original. Qualifier: Diagnosis of  By: Nils Pyle CMA (AAMA), Leisha   Anxiety    B12 deficiency 05/05/2020   Chicken pox    Essential tremor    of head, sometimes hands.  pt takes primidone to help the tremors-DR. MILLER-NEUROLOGIST IN [place].  PT WAS STARTED ON VALUIM MANY YEARS AGO FOR HER TREMORS--AND CONTINUES TO TAKE.   Fracture of humerus, proximal, left, closed 10/28/2011   pt has finished physical  therapy-limited ROM reaching to her back and unable to lift heavy things with left hand/arm   GERD (gastroesophageal reflux disease)    H/O hiatal hernia    Hereditary and idiopathic peripheral neuropathy 03/27/2014   Hx of colonic polyps    Osteoarthritis    PAIN AND OA  BILATERAL KNEES; ALSO HAS ARTHRITIS IN HANDS AND BACK BUT NO BACK PAIN   Other chronic otitis externa 03/01/2009   Formatting of this note might be different from the original. Qualifier: Diagnosis of  By: Arnoldo Morale MD, Balinda Quails    SURGICAL HISTORY: Past Surgical History:  Procedure Laterality Date   arthroscopy left knee     arthroscopy right knee     bmp  07/17/2000   BREAST BIOPSY Right 03/23/2021   x2   CATARACT EXTRACTION, BILATERAL     COLONOSCOPY W/ POLYPECTOMY     DILATION AND CURETTAGE OF UTERUS     RIGHT FOOT SURGERIES X 2     TOTAL KNEE ARTHROPLASTY  03/13/2012   Procedure: TOTAL KNEE ARTHROPLASTY;  Surgeon: Gearlean Alf, MD;  Location: [facility];  Service: Orthopedics;  Laterality: Left;  steroid injection right knee   TOTAL KNEE ARTHROPLASTY Right 08/04/2013   Procedure: RIGHT TOTAL KNEE ARTHROPLASTY;  Surgeon: Gearlean Alf, MD;  Location: [facility];  Service: Orthopedics;  Laterality: Right;    SOCIAL HISTORY: Social History   Socioeconomic History   Marital status: Widowed    Spouse name: Not on file   Number of children: 3   Years of education: Not on file   Highest education level: Not on file  Occupational History   Not on file  Tobacco Use   Smoking status: Never   Smokeless tobacco: Never  Substance and Sexual Activity   Alcohol use: Yes    Comment: Rare   Drug use: No   Sexual activity: Not Currently  Other Topics Concern   Not on file  Social History Narrative   Not on file   Social Determinants of Health   Financial Resource Strain: Low Risk    Difficulty of Paying Living Expenses: Not hard at all  Food Insecurity: No Food Insecurity   Worried About Running Out of Food in the Last Year: Never true   [place] in the Last Year: Never true  Transportation Needs: No Transportation Needs   Lack of Transportation (Medical): No   Lack of Transportation (Non-Medical): No  Physical Activity: Inactive   Days of Exercise per Week: 0 days    Minutes of Exercise per Session: 0 min  Stress: No Stress Concern Present   Feeling of Stress : Only a little  Social Connections: Moderately Isolated   Frequency of Communication with Friends and Family: More than three times a week   Frequency of Social Gatherings with Friends and Family: Once a week   Attends Religious Services: 1 to 4 times per year   Active Member of [employer] or Organizations: No   Attends Archivist Meetings: Never   Marital Status: Widowed  Human resources officer Violence: Not At Risk   Fear of Current or Ex-Partner: No   Emotionally Abused: No   Physically Abused: No   Sexually Abused: No    FAMILY HISTORY: Family History  Problem Relation Age of Onset   Stroke Mother    Kidney disease Father    Cancer Maternal Grandmother        Leukemia   Kidney failure Other  renal disease    ALLERGIES:  is allergic to memantine, propranolol, codeine, and sulfamethoxazole.  MEDICATIONS:  Current Outpatient Medications  Medication Sig Dispense Refill   diazepam (VALIUM) 2 MG tablet Take 1 tablet (2 mg total) by mouth every 8 (eight) hours as needed for anxiety. 180 tablet 1   hyoscyamine (LEVSIN) 0.125 MG tablet Take 1 tablet (0.125 mg total) by mouth every 4 (four) hours as needed for cramping. (Patient not taking: Reported on 03/17/2021) 60 tablet 1   MYSOLINE 250 MG tablet Take 250 mg by mouth 2 (two) times daily.     omeprazole (PRILOSEC) 20 MG capsule Take 1 capsule (20 mg total) by mouth daily. 90 capsule 3   Vitamin D, Ergocalciferol, (DRISDOL) 1.25 MG (50000 UNIT) CAPS capsule Take 1 capsule (50,000 Units total) by mouth every 7 (seven) days. 5 capsule 3   No current facility-administered medications for this visit.    PHYSICAL EXAMINATION: ECOG PERFORMANCE STATUS: 0 - Asymptomatic  Vitals:   03/30/21 0844  BP: (!) 112/54  Pulse: 66  Resp: 18  Temp: 97.8 F (36.6 C)  SpO2: 99%   Filed Weights   03/30/21 0844  Weight: 170 lb 3.2 oz (77.2  kg)    GENERAL:alert, no distress and comfortable SKIN: skin color, texture, turgor are normal, no rashes or significant lesions EYES: normal, Conjunctiva are pink and non-injected, sclera clear  NECK: supple, thyroid normal size, non-tender, without nodularity LYMPH:  no palpable lymphadenopathy in the cervical, axillary  LUNGS: clear to auscultation and percussion with normal breathing effort HEART: regular rate & rhythm and no murmurs and no lower extremity edema ABDOMEN:abdomen soft, non-tender and normal bowel sounds Musculoskeletal:no cyanosis of digits and no clubbing, (+) spinal tenderness NEURO: alert & oriented x 3 with fluent speech, no focal motor/sensory deficits BREAST: left breast tenderness; bruising to biopsy site in right breast, palpable 2 cm x 3 cm lump at 2 o'clock from bleeding. No palpable mass, nodules or adenopathy bilaterally.   LABORATORY DATA:  I have reviewed the data as listed CBC Latest Ref Rng & Units 03/30/2021 03/17/2021 04/30/2020  WBC 4.0 - 10.5 K/uL 5.0 4.7 4.5  Hemoglobin 12.0 - 15.0 g/dL 13.8 13.3 13.8  Hematocrit 36.0 - 46.0 % 39.6 39.1 40.9  Platelets 150 - 400 K/uL 168 176.0 165.0    CMP Latest Ref Rng & Units 03/30/2021 03/17/2021 04/30/2020  Glucose 70 - 99 mg/dL 95 90 92  BUN 8 - 23 mg/dL 12 12 20   Creatinine 0.44 - 1.00 mg/dL 0.85 0.74 0.86  Sodium 135 - 145 mmol/L 139 139 139  Potassium 3.5 - 5.1 mmol/L 4.3 4.6 4.4  Chloride 98 - 111 mmol/L 104 104 103  CO2 22 - 32 mmol/L 26 27 30   Calcium 8.9 - 10.3 mg/dL 9.4 9.2 9.0  Total Protein 6.5 - 8.1 g/dL 7.3 7.0 7.0  Total Bilirubin 0.3 - 1.2 mg/dL 0.5 0.5 0.5  Alkaline Phos 38 - 126 U/L 70 66 66  AST 15 - 41 U/L 21 20 22   ALT 0 - 44 U/L 25 17 25      RADIOGRAPHIC STUDIES: I have personally reviewed the radiological images as listed and agreed with the findings in the report. [place] AXILLARY NODE CORE BIOPSY RIGHT  Addendum Date: 03/24/2021   ADDENDUM REPORT: 03/24/2021 14:28 ADDENDUM:  Pathology revealed GRADE II INVASIVE DUCTAL CARCINOMA, LOW GRADE DUCTAL CARCINOMA IN SITU of the RIGHT breast, 12:00 o'clock, 3cmfn, ribbon clip. This was found to be concordant by Dr.  Ileana Roup. Pathology revealed BENIGN LYMPH NODE of the RIGHT axilla, Q clip. This was found to be concordant by Dr. Ileana Roup. Pathology results were discussed with the patient by telephone. The patient reported doing well after the biopsies with tenderness at the sites. Post biopsy instructions and care were reviewed and questions were answered. The patient was encouraged to call [facility] for any additional concerns. The patient was referred to [facility] on March 30, 2021. Pathology results reported by Stacie Acres RN on 03/24/2021. Electronically Signed   By: Ileana Roup M.D.   On: 03/24/2021 14:28   Result Date: 03/24/2021 CLINICAL DATA:  Right axillary lymph node with borderline cortical thickening EXAM: [place] AXILLARY NODE CORE BIOPSY RIGHT COMPARISON:  Previous exam(s). PROCEDURE: I met with the patient and we discussed the procedure of ultrasound-guided biopsy, including benefits and alternatives. We discussed the high likelihood of a successful procedure. We discussed the risks of the procedure, including infection, bleeding, tissue injury, clip migration, and inadequate sampling. Informed written consent was given. The usual time-out protocol was performed immediately prior to the procedure. Using sterile technique and 1% Lidocaine as local anesthetic, under direct ultrasound visualization, a 14 gauge spring-loaded device was used to perform biopsy of an axillary lymph node with borderline cortical thickening using an inferior approach. At the conclusion of the procedure a Q shaped tissue marker clip was deployed into the biopsy cavity. Follow up 2 view mammogram was performed and dictated separately. IMPRESSION:  Ultrasound guided biopsy of right axillary lymph node. No apparent complications. Electronically Signed: By: Ileana Roup M.D. On: 03/23/2021 15:23  MM CLIP PLACEMENT RIGHT  Result Date: 03/23/2021 CLINICAL DATA:  Postprocedure mammogram. EXAM: 3D DIAGNOSTIC RIGHT MAMMOGRAM POST ULTRASOUND BIOPSY COMPARISON:  Previous exam(s). FINDINGS: 3D Mammographic images were obtained following ultrasound guided biopsy of mass at the right breast 12 o'clock position and right axillary lymph node. The ribbon biopsy marking clip is in expected position at the site of biopsy at the right breast 12 o'clock position. The Q shaped biopsy clip in appropriate position within the right axilla. IMPRESSION: 1. Appropriate positioning of the ribbon shaped biopsy marking clip at the site of biopsy in the right breast 12 o'clock position middle depth. 2. Appropriate positioning of the Q shaped biopsy marking clip at the site of biopsy in the right axilla. Final Assessment: Post Procedure Mammograms for Marker Placement Electronically Signed   By: Ileana Roup M.D.   On: 03/23/2021 15:19  [place] RT BREAST BX W LOC DEV 1ST LESION IMG BX SPEC US GUIDE  Addendum Date: 03/24/2021   ADDENDUM REPORT: 03/24/2021 14:27 ADDENDUM: Pathology revealed GRADE II INVASIVE DUCTAL CARCINOMA, LOW GRADE DUCTAL CARCINOMA IN SITU of the RIGHT breast, 12:00 o'clock, 3cmfn, ribbon clip. This was found to be concordant by Dr. Ileana Roup. Pathology revealed BENIGN LYMPH NODE of the RIGHT axilla, Q clip. This was found to be concordant by Dr. Ileana Roup. Pathology results were discussed with the patient by telephone. The patient reported doing well after the biopsies with tenderness at the sites. Post biopsy instructions and care were reviewed and questions were answered. The patient was encouraged to call [facility] for any additional concerns. The patient was referred to [facility] on March 30, 2021. Pathology results reported by Stacie Acres RN on 03/24/2021. Electronically Signed  By: Ileana Roup M.D.   On: 03/24/2021 14:27   Result Date: 03/24/2021 CLINICAL DATA:  Suspicious mass at the right breast 12 o'clock position. EXAM: ULTRASOUND GUIDED RIGHT BREAST CORE NEEDLE BIOPSY COMPARISON:  Previous exam(s). PROCEDURE: I met with the patient and we discussed the procedure of ultrasound-guided biopsy, including benefits and alternatives. We discussed the high likelihood of a successful procedure. We discussed the risks of the procedure, including infection, bleeding, tissue injury, clip migration, and inadequate sampling. Informed written consent was given. The usual time-out protocol was performed immediately prior to the procedure. Lesion quadrant: Upper central breast, 12 o'clock position Using sterile technique and 1% Lidocaine as local anesthetic, under direct ultrasound visualization, a 12 gauge spring-loaded device was used to perform biopsy of a 1.5 cm mass at the right breast 12 o'clock position 3 cm from nipple using an inferolateral approach. At the conclusion of the procedure a ribbon shaped tissue marker clip was deployed into the biopsy cavity. Follow up 2 view mammogram was performed and dictated separately. IMPRESSION: Ultrasound guided biopsy of mass at the right breast 12 o'clock position. No apparent complications. Electronically Signed: By: Ileana Roup M.D. On: 03/23/2021 15:22    No orders of the defined types were placed in this encounter.   All questions were answered. The patient knows to call the clinic with any problems, questions or concerns. The total time spent in the appointment was 50 minutes.     Truitt Merle, MD 03/30/2021 10:46 AM  I, Wilburn Mylar, am acting as scribe for Truitt Merle, MD.   I have reviewed the above documentation for accuracy and completeness, and I agree with the above.

## 2021-03-30 NOTE — Patient Instructions (Signed)

## 2021-03-30 NOTE — Progress Notes (Signed)
[facility]  Initial Assessment  Assessment performed by [facility] Brad Falardeau is a 87 y.o. year old female accompanied by patient and daughter Malachy Mood. Social Work was referred by [facility] for assessment of psychosocial needs.   SDOH (Social Determinants of Health) assessments performed: Yes SDOH Interventions    Flowsheet Row Most Recent Value  SDOH Interventions   Food Insecurity Interventions Intervention Not Indicated  Financial Strain Interventions Intervention Not Indicated  Housing Interventions Intervention Not Indicated  Transportation Interventions Intervention Not Indicated       Distress Screen completed: Yes ONCBCN DISTRESS SCREENING 03/30/2021  Distress experienced in past week (1-10) 8  Family Problem type Children  Emotional problem type Nervousness/Anxiety  Physical Problem type Pain;Constipation/diarrhea      Family/Social Information:  Housing Arrangement: patient lives alone Family members/support persons in your life? Family and Friends Transportation concerns: no  Employment: Retired. Income source: Paediatric nurse concerns: No Type of concern: Food Food access concerns: no Religious or spiritual practice: not addressed Medication Concerns: no  Services Currently in place:  N/A  Coping/ Adjustment to diagnosis: Patient understands treatment plan and what happens next? yes Concerns about diagnosis and/or treatment: How I will care for other members of my family Patient reported stressors:  Pt's daughter has cerebral palsy  and recently had surgery. Pt is currently living with daughter in order to care for her. Hopes and priorities: continued care for family members while pt is in treatment. Patient enjoys time with family/ friends Current coping skills/ strengths: Ability for insight , Active sense of humor , Capable of independent living , and Supportive family/friends     SUMMARY: Current SDOH Barriers:   Caretaking responsibilities during treatment.  Clinical Social Work Clinical Goal(s):  None identified at this time.  Interventions: Discussed common feeling and emotions when being diagnosed with cancer, and the importance of support during treatment. Provided active listening and emotional support as patient discussed care giving responsibilities. Informed patient of the support team roles and support services at [facility] Provided CSW contact information and encouraged patient to call with any questions or concerns Provided patient with information about support services.   Follow Up Plan:  [facility] intern will call pt to follow up next week. Patient verbalizes understanding of plan: Yes   Alexis brown BSW Intern [facility] , LCSW

## 2021-03-31 ENCOUNTER — Other Ambulatory Visit: Payer: Self-pay | Admitting: Surgery

## 2021-03-31 ENCOUNTER — Telehealth: Payer: Self-pay | Admitting: Hematology

## 2021-03-31 DIAGNOSIS — C50911 Malignant neoplasm of unspecified site of right female breast: Secondary | ICD-10-CM

## 2021-03-31 NOTE — Telephone Encounter (Signed)
No los 9/14

## 2021-04-06 ENCOUNTER — Encounter: Payer: Self-pay | Admitting: General Practice

## 2021-04-06 NOTE — Progress Notes (Signed)
04/05/21 2:00pm  [place] CSW Progress Notes   Encounter completed by BSW Intern Rosary Lively. Notes entered by Edwyna Shell since Intern does not have full access to Epic at this time.  BSW Intern spoke to pt by phone to check in and follow up with her since [facility] last week. Pt has no needs or concerns at this time.   Edwyna Shell, LCSW Clinical Social Worker Phone:  [phone]

## 2021-04-07 ENCOUNTER — Telehealth: Payer: Self-pay | Admitting: *Deleted

## 2021-04-07 ENCOUNTER — Encounter: Payer: Self-pay | Admitting: *Deleted

## 2021-04-07 NOTE — Telephone Encounter (Signed)
Spoke with patient to follow up from [facility] and assess navigation needs. Patient denies any questions or concerns at this time. Encouraged her to call should anything arise.Patient verbalized understanding.  

## 2021-04-11 ENCOUNTER — Ambulatory Visit
Admission: RE | Admit: 2021-04-11 | Discharge: 2021-04-11 | Disposition: A | Discharge status: Home / Self Care | Payer: Medicare Other | Source: Ambulatory Visit | Attending: Family Medicine | Admitting: Family Medicine

## 2021-04-11 ENCOUNTER — Other Ambulatory Visit: Payer: Self-pay

## 2021-04-11 DIAGNOSIS — N644 Mastodynia: Secondary | ICD-10-CM | POA: Diagnosis not present

## 2021-04-11 DIAGNOSIS — M5414 Radiculopathy, thoracic region: Secondary | ICD-10-CM

## 2021-04-11 DIAGNOSIS — Z853 Personal history of malignant neoplasm of breast: Secondary | ICD-10-CM | POA: Diagnosis not present

## 2021-04-11 DIAGNOSIS — M25512 Pain in left shoulder: Secondary | ICD-10-CM | POA: Diagnosis not present

## 2021-04-11 DIAGNOSIS — M25511 Pain in right shoulder: Secondary | ICD-10-CM | POA: Diagnosis not present

## 2021-04-11 MED ORDER — IOPAMIDOL (ISOVUE-300) INJECTION 61%
75.0000 mL | Freq: Once | INTRAVENOUS | Status: AC | PRN
  Administered 2021-04-11: 75 mL via INTRAVENOUS

## 2021-04-12 ENCOUNTER — Encounter: Payer: Self-pay | Admitting: Family Medicine

## 2021-04-12 ENCOUNTER — Other Ambulatory Visit: Payer: Self-pay | Admitting: Family Medicine

## 2021-04-12 DIAGNOSIS — E278 Other specified disorders of adrenal gland: Secondary | ICD-10-CM

## 2021-04-12 DIAGNOSIS — I700 Atherosclerosis of aorta: Secondary | ICD-10-CM | POA: Insufficient documentation

## 2021-04-12 DIAGNOSIS — R935 Abnormal findings on diagnostic imaging of other abdominal regions, including retroperitoneum: Secondary | ICD-10-CM

## 2021-04-13 ENCOUNTER — Other Ambulatory Visit: Payer: Self-pay

## 2021-04-13 ENCOUNTER — Encounter (HOSPITAL_BASED_OUTPATIENT_CLINIC_OR_DEPARTMENT_OTHER): Payer: Self-pay | Admitting: Surgery

## 2021-04-14 ENCOUNTER — Other Ambulatory Visit: Payer: Self-pay

## 2021-04-14 ENCOUNTER — Ambulatory Visit (INDEPENDENT_AMBULATORY_CARE_PROVIDER_SITE_OTHER): Payer: Medicare Other | Admitting: Family Medicine

## 2021-04-14 VITALS — BP 120/70 | HR 57 | Temp 97.0°F | Ht 64.0 in | Wt 169.4 lb

## 2021-04-14 DIAGNOSIS — E278 Other specified disorders of adrenal gland: Secondary | ICD-10-CM

## 2021-04-14 DIAGNOSIS — G250 Essential tremor: Secondary | ICD-10-CM | POA: Diagnosis not present

## 2021-04-14 DIAGNOSIS — M5414 Radiculopathy, thoracic region: Secondary | ICD-10-CM | POA: Diagnosis not present

## 2021-04-14 DIAGNOSIS — Z170 Estrogen receptor positive status [ER+]: Secondary | ICD-10-CM

## 2021-04-14 DIAGNOSIS — C50411 Malignant neoplasm of upper-outer quadrant of right female breast: Secondary | ICD-10-CM | POA: Diagnosis not present

## 2021-04-14 MED ORDER — DIAZEPAM 5 MG PO TABS: 5.0000 mg | ORAL_TABLET | Freq: Three times a day (TID) | ORAL | 1 refills | Status: DC | PRN

## 2021-04-14 NOTE — Progress Notes (Signed)
[facility] [address] [place] [address] Dept: [phone] Dept Fax: [phone]  Office Visit  Subjective:    Patient ID: [name], female    DOB: 07-Dec-1933, 87 y.o..   MRN: [id number]  Chief Complaint  Patient presents with   Follow-up    4 week f/u after CT scan.  Will get flu shot today.     History of Present Illness:  Patient is in today for ongoing coordination of care related to her recent diagnosis of breast cancer. Ms. Gargus had been seeing Dr. Bryan Lemma earlier this year with left breast pain. This began more in the upper outer breast, but has gradually migrated to the central breast. She noted that wearing a bra makes the pain worse. The discomfort is a burning sensation and had expanded to include pain in the left thoracic area, as well. She had an ultraound of her breasts that noted a suspicious mass in the right breast. She underwent biopsy of this that showed an invasive ductal carcinoma ER positive PR positive HER2/neu negative. She is scheduled for a lumpectomy on 04/20/2021 ([facility]). They plan post-surgical management with an aromatase inhibitor. It is not decided if she will need radiation or not. Ms. Cardiff admits much of this is a bit of a blur for her.  Ms. Statzer's CT of the chest showed a right adrenal nodule. She had not seen that result.  Ms. Wooldridge notes that her essential tremor has been worse. She had previously been on 5 mg TID of Valium to control her tremor. This had been decreased over time to 2 mg, which is not as effective.  Ms. Tiedt continues to complain of left breast pain with a sensation of something crawling under the skin, and point tenderness in the left posterior chest.  Past Medical History: Patient Active Problem List   Diagnosis Date Noted   Adrenal nodule ([place]) 04/12/2021   Aortic atherosclerosis ([place]) 04/12/2021   Malignant neoplasm of upper-outer quadrant of  right breast in female, estrogen receptor positive ([place]) 03/28/2021   Hyperlipidemia 03/18/2021   Invasive ductal carcinoma of breast, female, right ([place]) 02/28/2021   Vitamin D deficiency 08/05/2019   Elevated blood pressure reading without diagnosis of hypertension 04/30/2019   Allergic rhinitis 10/28/2018   OSA (obstructive sleep apnea) 11/28/2017   Iron overload 02/21/2016   Senile nuclear sclerosis 12/10/2014   Gait difficulty 03/27/2014   Low bone mass 10/31/2012   Gastro-esophageal reflux disease without esophagitis 06/25/2012   Fecal incontinence 06/25/2012   Thoracic radiculitis 07/20/2010   Diverticulosis of colon 09/26/2007   Depression with anxiety 09/26/2007   Enlarged lymph nodes 07/26/2007   History of colonic polyps 05/15/2007   Irritable bowel syndrome 05/15/2007   Tremor, essential 02/11/2007   Osteoarthritis of both hands 02/11/2007   Past Surgical History:  Procedure Laterality Date   arthroscopy left knee     arthroscopy right knee     bmp  07/17/2000   BREAST BIOPSY Right 03/23/2021   x2   CATARACT EXTRACTION, BILATERAL     COLONOSCOPY W/ POLYPECTOMY     DILATION AND CURETTAGE OF UTERUS     RIGHT FOOT SURGERIES X 2     TOTAL KNEE ARTHROPLASTY  03/13/2012   Procedure: TOTAL KNEE ARTHROPLASTY;  Surgeon: Gearlean Alf, MD;  Location: [facility];  Service: Orthopedics;  Laterality: Left;  steroid injection right knee   TOTAL KNEE ARTHROPLASTY Right 08/04/2013   Procedure: RIGHT TOTAL KNEE ARTHROPLASTY;  Surgeon: Gearlean Alf, MD;  Location: [facility];  Service: Orthopedics;  Laterality: Right;   Family History  Problem Relation Age of Onset   Stroke Mother    Kidney disease Father    Cancer Maternal Grandmother        Leukemia   Kidney failure Other        renal disease   Outpatient Medications Prior to Visit  Medication Sig Dispense Refill   hyoscyamine (LEVSIN) 0.125 MG tablet Take 1 tablet (0.125 mg total) by mouth every 4 (four) hours as needed for  cramping. 60 tablet 1   MYSOLINE 250 MG tablet Take 250 mg by mouth 2 (two) times daily.     omeprazole (PRILOSEC) 20 MG capsule Take 1 capsule (20 mg total) by mouth daily. 90 capsule 3   Vitamin D, Ergocalciferol, (DRISDOL) 1.25 MG (50000 UNIT) CAPS capsule Take 1 capsule (50,000 Units total) by mouth every 7 (seven) days. 5 capsule 3   diazepam (VALIUM) 2 MG tablet Take 1 tablet (2 mg total) by mouth every 8 (eight) hours as needed for anxiety. 180 tablet 1   VITAMIN E PO Take by mouth. (Patient not taking: Reported on 04/14/2021)     No facility-administered medications prior to visit.   Allergies  Allergen Reactions   Memantine Swelling   Propranolol Other (See Comments)    Made her head feel very funny Made her head feel very funny    Codeine Nausea And Vomiting   Sulfamethoxazole Rash    REACTION: unspecified   Objective:   Today's Vitals   04/14/21 1101  BP: 120/70  Pulse: (!) 57  Temp: (!) 97 F (36.1 C)  TempSrc: Temporal  SpO2: 95%  Weight: 169 lb 6.4 oz (76.8 kg)  Height: 5' 4"  (1.626 m)   Body mass index is 29.08 kg/m.   General: Well developed, well nourished. No acute distress. Chest: There is point tenderness over the muscles in the left posterior chest at about the T6/T7 level. Psych: Alert and oriented. Normal mood and affect.  Health Maintenance Due  Topic Date Due   Zoster Vaccines- Shingrix (1 of 2) Never done   TETANUS/TDAP  07/17/2002   COVID-19 Vaccine (3 - Pfizer risk series) 11/24/2019   INFLUENZA VACCINE  02/14/2021   Imaging: CT of Chest (04/11/2021) IMPRESSION: No acute intrathoracic abnormalities.   Small hiatal hernia.   17 x 10 mm RIGHT adrenal nodule, indeterminate, new since 08/01/2007; recommend follow-up adrenal CT assessment.   Asymmetric opacity in the RIGHT breast 21 x 12 x 14 mm, recommend correlation with physical exam and recent diagnostic mammography/ultrasound.   Aortic Atherosclerosis (ICD10-I70.0).  Lab  Results CBC Latest Ref Rng & Units 03/30/2021 03/17/2021 04/30/2020  WBC 4.0 - 10.5 K/uL 5.0 4.7 4.5  Hemoglobin 12.0 - 15.0 g/dL 13.8 13.3 13.8  Hematocrit 36.0 - 46.0 % 39.6 39.1 40.9  Platelets 150 - 400 K/uL 168 176.0 165.0   Lab Results  Component Value Date   TSH 2.60 03/17/2021   CMP Latest Ref Rng & Units 03/30/2021 03/17/2021 04/30/2020  Glucose 70 - 99 mg/dL 95 90 92  BUN 8 - 23 mg/dL 12 12 20   Creatinine 0.44 - 1.00 mg/dL 0.85 0.74 0.86  Sodium 135 - 145 mmol/L 139 139 139  Potassium 3.5 - 5.1 mmol/L 4.3 4.6 4.4  Chloride 98 - 111 mmol/L 104 104 103  CO2 22 - 32 mmol/L 26 27 30   Calcium 8.9 - 10.3 mg/dL 9.4 9.2 9.0  Total  Protein 6.5 - 8.1 g/dL 7.3 7.0 7.0  Total Bilirubin 0.3 - 1.2 mg/dL 0.5 0.5 0.5  Alkaline Phos 38 - 126 U/L 70 66 66  AST 15 - 41 U/L 21 20 22   ALT 0 - 44 U/L 25 17 25    Lab Results  Component Value Date   CHOL 255 (H) 03/17/2021   HDL 50.10 03/17/2021   LDLDIRECT 143.0 03/17/2021   TRIG 258.0 (H) 03/17/2021   CHOLHDL 5 03/17/2021      Assessment & Plan:   1. Malignant neoplasm of upper-outer quadrant of right breast in female, estrogen receptor positive ([place]) Plan for lumpectomy followed by treatment with an aromatase inhibitor +/- radiation.  2. Adrenal nodule ([place]) I reviewed the finding of an incidental right adrenal nodule on the recent Chest CT and the recommendation for an Adrenal CT. This is scheduled for 10/21. I will plan to see her back the following week.  3. Thoracic radiculitis The source of her pain is still not clear. the Chest CT did not find any spinal, costal, or pulmonary abnormalities to explain her pain. We discussed that we will pursue this further after her breast surgery is complete.  4. Tremor, essential I am willing to increae her valium back to 5 mg TID for management of her tremor.  - diazepam (VALIUM) 5 MG tablet; Take 1 tablet (5 mg total) by mouth every 8 (eight) hours as needed for anxiety.  Dispense: 90 tablet;  Refill: 1  [name], MD

## 2021-04-18 ENCOUNTER — Ambulatory Visit
Admission: RE | Admit: 2021-04-18 | Discharge: 2021-04-18 | Disposition: A | Discharge status: Home / Self Care | Payer: Medicare Other | Source: Ambulatory Visit | Attending: Surgery | Admitting: Surgery

## 2021-04-18 ENCOUNTER — Other Ambulatory Visit: Payer: Self-pay

## 2021-04-18 DIAGNOSIS — C50911 Malignant neoplasm of unspecified site of right female breast: Secondary | ICD-10-CM

## 2021-04-18 DIAGNOSIS — C50811 Malignant neoplasm of overlapping sites of right female breast: Secondary | ICD-10-CM | POA: Diagnosis not present

## 2021-04-18 MED ORDER — CHLORHEXIDINE GLUCONATE CLOTH 2 % EX PADS: 6.0000 | MEDICATED_PAD | Freq: Once | CUTANEOUS | Status: DC

## 2021-04-18 NOTE — Progress Notes (Signed)

## 2021-04-20 ENCOUNTER — Ambulatory Visit
Admission: RE | Admit: 2021-04-20 | Discharge: 2021-04-20 | Disposition: A | Discharge status: Home / Self Care | Payer: Medicare Other | Source: Ambulatory Visit | Attending: Surgery | Admitting: Surgery

## 2021-04-20 ENCOUNTER — Ambulatory Visit (HOSPITAL_BASED_OUTPATIENT_CLINIC_OR_DEPARTMENT_OTHER): Payer: Medicare Other | Admitting: Anesthesiology

## 2021-04-20 ENCOUNTER — Other Ambulatory Visit: Payer: Self-pay

## 2021-04-20 ENCOUNTER — Encounter (HOSPITAL_BASED_OUTPATIENT_CLINIC_OR_DEPARTMENT_OTHER): Payer: Self-pay | Admitting: Surgery

## 2021-04-20 ENCOUNTER — Encounter (HOSPITAL_BASED_OUTPATIENT_CLINIC_OR_DEPARTMENT_OTHER)
Admission: RE | Disposition: A | Discharge status: Home / Self Care | Payer: Self-pay | Source: Home / Self Care | Attending: Surgery

## 2021-04-20 ENCOUNTER — Ambulatory Visit (HOSPITAL_COMMUNITY)
Admission: RE | Admit: 2021-04-20 | Discharge: 2021-04-20 | Disposition: A | Discharge status: Home / Self Care | Payer: Medicare Other | Source: Ambulatory Visit | Attending: Surgery | Admitting: Surgery

## 2021-04-20 ENCOUNTER — Ambulatory Visit (HOSPITAL_BASED_OUTPATIENT_CLINIC_OR_DEPARTMENT_OTHER)
Admission: RE | Admit: 2021-04-20 | Discharge: 2021-04-20 | Disposition: A | Discharge status: Home / Self Care | Payer: Medicare Other | Attending: Surgery | Admitting: Surgery

## 2021-04-20 DIAGNOSIS — Z888 Allergy status to other drugs, medicaments and biological substances status: Secondary | ICD-10-CM | POA: Diagnosis not present

## 2021-04-20 DIAGNOSIS — Z809 Family history of malignant neoplasm, unspecified: Secondary | ICD-10-CM | POA: Diagnosis not present

## 2021-04-20 DIAGNOSIS — Z882 Allergy status to sulfonamides status: Secondary | ICD-10-CM | POA: Insufficient documentation

## 2021-04-20 DIAGNOSIS — Z170 Estrogen receptor positive status [ER+]: Secondary | ICD-10-CM | POA: Insufficient documentation

## 2021-04-20 DIAGNOSIS — C50411 Malignant neoplasm of upper-outer quadrant of right female breast: Secondary | ICD-10-CM | POA: Diagnosis not present

## 2021-04-20 DIAGNOSIS — E559 Vitamin D deficiency, unspecified: Secondary | ICD-10-CM | POA: Diagnosis not present

## 2021-04-20 DIAGNOSIS — Z885 Allergy status to narcotic agent status: Secondary | ICD-10-CM | POA: Insufficient documentation

## 2021-04-20 DIAGNOSIS — C50911 Malignant neoplasm of unspecified site of right female breast: Secondary | ICD-10-CM

## 2021-04-20 DIAGNOSIS — G8918 Other acute postprocedural pain: Secondary | ICD-10-CM | POA: Diagnosis not present

## 2021-04-20 DIAGNOSIS — Z79899 Other long term (current) drug therapy: Secondary | ICD-10-CM | POA: Diagnosis not present

## 2021-04-20 DIAGNOSIS — R928 Other abnormal and inconclusive findings on diagnostic imaging of breast: Secondary | ICD-10-CM | POA: Diagnosis not present

## 2021-04-20 HISTORY — PX: BREAST LUMPECTOMY: SHX2

## 2021-04-20 HISTORY — PX: BREAST LUMPECTOMY WITH RADIOACTIVE SEED AND SENTINEL LYMPH NODE BIOPSY: SHX6550

## 2021-04-20 SURGERY — BREAST LUMPECTOMY WITH RADIOACTIVE SEED AND SENTINEL LYMPH NODE BIOPSY
Anesthesia: General | Site: Breast | Laterality: Right

## 2021-04-20 MED ORDER — BUPIVACAINE-EPINEPHRINE (PF) 0.25% -1:200000 IJ SOLN
INTRAMUSCULAR | Status: DC | PRN
  Administered 2021-04-20: 11 mL

## 2021-04-20 MED ORDER — IBUPROFEN 800 MG PO TABS: 800.0000 mg | ORAL_TABLET | Freq: Three times a day (TID) | ORAL | 0 refills | Status: DC | PRN

## 2021-04-20 MED ORDER — MEPERIDINE HCL 25 MG/ML IJ SOLN: 6.2500 mg | INTRAMUSCULAR | Status: DC | PRN

## 2021-04-20 MED ORDER — ONDANSETRON HCL 4 MG/2ML IJ SOLN
INTRAMUSCULAR | Status: DC | PRN
  Administered 2021-04-20: 4 mg via INTRAVENOUS

## 2021-04-20 MED ORDER — CEFAZOLIN SODIUM-DEXTROSE 2-4 GM/100ML-% IV SOLN
INTRAVENOUS | Status: AC
  Filled 2021-04-20: qty 100

## 2021-04-20 MED ORDER — MAGTRACE LYMPHATIC TRACER
INTRAMUSCULAR | Status: DC | PRN
  Administered 2021-04-20: 2 mL via INTRAMUSCULAR

## 2021-04-20 MED ORDER — ROCURONIUM BROMIDE 10 MG/ML (PF) SYRINGE
PREFILLED_SYRINGE | INTRAVENOUS | Status: AC
  Filled 2021-04-20: qty 10

## 2021-04-20 MED ORDER — ACETAMINOPHEN 325 MG PO TABS: 325.0000 mg | ORAL_TABLET | ORAL | Status: DC | PRN

## 2021-04-20 MED ORDER — BUPIVACAINE-EPINEPHRINE (PF) 0.5% -1:200000 IJ SOLN
INTRAMUSCULAR | Status: DC | PRN
  Administered 2021-04-20: 30 mL via PERINEURAL

## 2021-04-20 MED ORDER — VANCOMYCIN HCL 500 MG IV SOLR
INTRAVENOUS | Status: AC
  Filled 2021-04-20: qty 10

## 2021-04-20 MED ORDER — CEFAZOLIN IN SODIUM CHLORIDE 3-0.9 GM/100ML-% IV SOLN
3.0000 g | INTRAVENOUS | Status: AC
  Administered 2021-04-20: 2 g via INTRAVENOUS

## 2021-04-20 MED ORDER — FENTANYL CITRATE (PF) 100 MCG/2ML IJ SOLN
INTRAMUSCULAR | Status: DC | PRN
  Administered 2021-04-20: 50 ug via INTRAVENOUS

## 2021-04-20 MED ORDER — EPHEDRINE SULFATE 50 MG/ML IJ SOLN
INTRAMUSCULAR | Status: DC | PRN
  Administered 2021-04-20: 15 mg via INTRAVENOUS

## 2021-04-20 MED ORDER — DEXAMETHASONE SODIUM PHOSPHATE 10 MG/ML IJ SOLN
INTRAMUSCULAR | Status: AC
  Filled 2021-04-20: qty 1

## 2021-04-20 MED ORDER — ONDANSETRON HCL 4 MG/2ML IJ SOLN
INTRAMUSCULAR | Status: AC
  Filled 2021-04-20: qty 2

## 2021-04-20 MED ORDER — SODIUM CHLORIDE 0.9 % IV SOLN
INTRAVENOUS | Status: AC
  Filled 2021-04-20: qty 10

## 2021-04-20 MED ORDER — ONDANSETRON HCL 4 MG/2ML IJ SOLN: 4.0000 mg | Freq: Once | INTRAMUSCULAR | Status: DC | PRN

## 2021-04-20 MED ORDER — EPHEDRINE 5 MG/ML INJ
INTRAVENOUS | Status: AC
  Filled 2021-04-20: qty 5

## 2021-04-20 MED ORDER — HYDROCODONE-ACETAMINOPHEN 5-325 MG PO TABS: 1.0000 | ORAL_TABLET | Freq: Four times a day (QID) | ORAL | 0 refills | Status: DC | PRN

## 2021-04-20 MED ORDER — SUGAMMADEX SODIUM 200 MG/2ML IV SOLN
INTRAVENOUS | Status: DC | PRN
  Administered 2021-04-20: 200 mg via INTRAVENOUS

## 2021-04-20 MED ORDER — ACETAMINOPHEN 160 MG/5ML PO SOLN: 325.0000 mg | ORAL | Status: DC | PRN

## 2021-04-20 MED ORDER — FENTANYL CITRATE (PF) 100 MCG/2ML IJ SOLN: 25.0000 ug | INTRAMUSCULAR | Status: DC | PRN

## 2021-04-20 MED ORDER — MIDAZOLAM HCL 2 MG/2ML IJ SOLN
INTRAMUSCULAR | Status: AC
  Filled 2021-04-20: qty 2

## 2021-04-20 MED ORDER — TECHNETIUM TC 99M TILMANOCEPT KIT
1.0000 | PACK | Freq: Once | INTRAVENOUS | Status: AC | PRN
  Administered 2021-04-20: 1 via INTRADERMAL

## 2021-04-20 MED ORDER — FENTANYL CITRATE (PF) 100 MCG/2ML IJ SOLN
INTRAMUSCULAR | Status: AC
  Filled 2021-04-20: qty 2

## 2021-04-20 MED ORDER — DEXAMETHASONE SODIUM PHOSPHATE 4 MG/ML IJ SOLN
INTRAMUSCULAR | Status: DC | PRN
  Administered 2021-04-20: 4 mg via INTRAVENOUS

## 2021-04-20 MED ORDER — PROPOFOL 10 MG/ML IV BOLUS
INTRAVENOUS | Status: DC | PRN
  Administered 2021-04-20: 150 mg via INTRAVENOUS
  Administered 2021-04-20: 50 mg via INTRAVENOUS

## 2021-04-20 MED ORDER — FENTANYL CITRATE (PF) 100 MCG/2ML IJ SOLN
50.0000 ug | Freq: Once | INTRAMUSCULAR | Status: AC
  Administered 2021-04-20: 50 ug via INTRAVENOUS

## 2021-04-20 MED ORDER — SODIUM CHLORIDE 0.9 % IV SOLN
INTRAVENOUS | Status: DC | PRN
  Administered 2021-04-20: 500 mL

## 2021-04-20 MED ORDER — OXYCODONE HCL 5 MG PO TABS
ORAL_TABLET | ORAL | Status: AC
  Filled 2021-04-20: qty 1

## 2021-04-20 MED ORDER — LIDOCAINE HCL (CARDIAC) PF 100 MG/5ML IV SOSY
PREFILLED_SYRINGE | INTRAVENOUS | Status: DC | PRN
  Administered 2021-04-20: 50 mg via INTRAVENOUS

## 2021-04-20 MED ORDER — PHENYLEPHRINE 40 MCG/ML (10ML) SYRINGE FOR IV PUSH (FOR BLOOD PRESSURE SUPPORT)
PREFILLED_SYRINGE | INTRAVENOUS | Status: AC
  Filled 2021-04-20: qty 10

## 2021-04-20 MED ORDER — LIDOCAINE 2% (20 MG/ML) 5 ML SYRINGE
INTRAMUSCULAR | Status: AC
  Filled 2021-04-20: qty 5

## 2021-04-20 MED ORDER — ROCURONIUM BROMIDE 10 MG/ML (PF) SYRINGE
PREFILLED_SYRINGE | INTRAVENOUS | Status: DC | PRN
  Administered 2021-04-20: 40 mg via INTRAVENOUS

## 2021-04-20 MED ORDER — MIDAZOLAM HCL 2 MG/2ML IJ SOLN
1.0000 mg | Freq: Once | INTRAMUSCULAR | Status: AC
  Administered 2021-04-20: 1 mg via INTRAVENOUS

## 2021-04-20 MED ORDER — OXYCODONE HCL 5 MG/5ML PO SOLN: 5.0000 mg | Freq: Once | ORAL | Status: AC | PRN

## 2021-04-20 MED ORDER — LACTATED RINGERS IV SOLN: INTRAVENOUS | Status: DC

## 2021-04-20 MED ORDER — VANCOMYCIN HCL 500 MG IV SOLR
INTRAVENOUS | Status: DC | PRN
  Administered 2021-04-20: 500 mg via TOPICAL

## 2021-04-20 MED ORDER — OXYCODONE HCL 5 MG PO TABS
5.0000 mg | ORAL_TABLET | Freq: Once | ORAL | Status: AC | PRN
  Administered 2021-04-20: 5 mg via ORAL

## 2021-04-20 SURGICAL SUPPLY — 52 items
ADH SKN CLS APL DERMABOND .7 (GAUZE/BANDAGES/DRESSINGS) ×1
APL PRP STRL LF DISP 70% ISPRP (MISCELLANEOUS) ×1
APPLIER CLIP 9.375 MED OPEN (MISCELLANEOUS) ×2
APR CLP MED 9.3 20 MLT OPN (MISCELLANEOUS) ×1
BINDER BREAST XLRG (GAUZE/BANDAGES/DRESSINGS) IMPLANT
BINDER BREAST XXLRG (GAUZE/BANDAGES/DRESSINGS) IMPLANT
BLADE SURG 15 STRL LF DISP TIS (BLADE) ×1 IMPLANT
BLADE SURG 15 STRL SS (BLADE) ×2
CANISTER SUC SOCK COL 7IN (MISCELLANEOUS) IMPLANT
CANISTER SUCT 1200ML W/VALVE (MISCELLANEOUS) ×2 IMPLANT
CHLORAPREP W/TINT 26 (MISCELLANEOUS) ×2 IMPLANT
CLIP APPLIE 9.375 MED OPEN (MISCELLANEOUS) ×1 IMPLANT
COVER BACK TABLE 60X90IN (DRAPES) ×2 IMPLANT
COVER MAYO STAND STRL (DRAPES) ×2 IMPLANT
COVER PROBE W GEL 5X96 (DRAPES) ×2 IMPLANT
DECANTER SPIKE VIAL GLASS SM (MISCELLANEOUS) IMPLANT
DERMABOND ADVANCED (GAUZE/BANDAGES/DRESSINGS) ×1
DERMABOND ADVANCED .7 DNX12 (GAUZE/BANDAGES/DRESSINGS) ×1 IMPLANT
DRAPE LAPAROSCOPIC ABDOMINAL (DRAPES) ×2 IMPLANT
DRAPE UTILITY XL STRL (DRAPES) ×2 IMPLANT
ELECT COATED BLADE 2.86 ST (ELECTRODE) ×2 IMPLANT
ELECT REM PT RETURN 9FT ADLT (ELECTROSURGICAL) ×2
ELECTRODE REM PT RTRN 9FT ADLT (ELECTROSURGICAL) ×1 IMPLANT
GLOVE SRG 8 PF TXTR STRL LF DI (GLOVE) ×1 IMPLANT
GLOVE SURG ENC MOIS LTX SZ6 (GLOVE) ×1 IMPLANT
GLOVE SURG LTX SZ8 (GLOVE) ×2 IMPLANT
GLOVE SURG UNDER POLY LF SZ7 (GLOVE) ×1 IMPLANT
GLOVE SURG UNDER POLY LF SZ8 (GLOVE) ×2
GOWN STRL REUS W/ TWL LRG LVL3 (GOWN DISPOSABLE) ×2 IMPLANT
GOWN STRL REUS W/ TWL XL LVL3 (GOWN DISPOSABLE) ×1 IMPLANT
GOWN STRL REUS W/TWL LRG LVL3 (GOWN DISPOSABLE) ×6
GOWN STRL REUS W/TWL XL LVL3 (GOWN DISPOSABLE) ×2
HEMOSTAT ARISTA ABSORB 3G PWDR (HEMOSTASIS) IMPLANT
HEMOSTAT SNOW SURGICEL 2X4 (HEMOSTASIS) IMPLANT
KIT MARKER MARGIN INK (KITS) ×2 IMPLANT
NDL HYPO 25X1 1.5 SAFETY (NEEDLE) ×1 IMPLANT
NDL SAFETY ECLIPSE 18X1.5 (NEEDLE) IMPLANT
NEEDLE HYPO 18GX1.5 SHARP (NEEDLE)
NEEDLE HYPO 25X1 1.5 SAFETY (NEEDLE) ×2 IMPLANT
NS IRRIG 1000ML POUR BTL (IV SOLUTION) ×2 IMPLANT
PACK BASIN DAY SURGERY FS (CUSTOM PROCEDURE TRAY) ×2 IMPLANT
PENCIL SMOKE EVACUATOR (MISCELLANEOUS) ×2 IMPLANT
SLEEVE SCD COMPRESS KNEE MED (STOCKING) ×2 IMPLANT
SPONGE T-LAP 4X18 ~~LOC~~+RFID (SPONGE) ×2 IMPLANT
SUT MNCRL AB 4-0 PS2 18 (SUTURE) ×2 IMPLANT
SUT VICRYL 3-0 CR8 SH (SUTURE) ×2 IMPLANT
SYR CONTROL 10ML LL (SYRINGE) ×2 IMPLANT
TOWEL GREEN STERILE FF (TOWEL DISPOSABLE) ×2 IMPLANT
TRACER MAGTRACE VIAL (MISCELLANEOUS) ×1 IMPLANT
TRAY FAXITRON CT DISP (TRAY / TRAY PROCEDURE) ×2 IMPLANT
TUBE CONNECTING 20X1/4 (TUBING) ×2 IMPLANT
YANKAUER SUCT BULB TIP NO VENT (SUCTIONS) ×2 IMPLANT

## 2021-04-20 NOTE — Interval H&P Note (Signed)
History and Physical Interval Note:  04/20/2021 1:14 PM  [name]  has presented today for surgery, with the diagnosis of RIGHT BREAST CANCER.  The various methods of treatment have been discussed with the patient and family. After consideration of risks, benefits and other options for treatment, the patient has consented to  Procedure(s): RIGHT BREAST LUMPECTOMY WITH RADIOACTIVE SEED AND SENTINEL LYMPH NODE BIOPSY (Right) as a surgical intervention.  The patient's history has been reviewed, patient examined, no change in status, stable for surgery.  I have reviewed the patient's chart and labs.  Questions were answered to the patient's satisfaction.     [name]

## 2021-04-20 NOTE — H&P (Signed)
Chief Complaint: Breast Cancer   History of Present Illness: [name] is a 87 y.o. female who is seen today as an office consultation at the request of Dr. Imaging for evaluation of Breast Cancer .   Patient presents for evaluation of right breast cancer. She is noted to have a 1.2 cm mass right breast upper outer quadrant core biopsy proven to be invasive ductal carcinoma ER positive PR positive HER2/neu negative with AKI of 5%. She denies any history of breast pain on the right but has had some breast pain on her left which prompted the investigation. Her imaging of her left side was otherwise unremarkable. No family history of breast cancer. She presents today for evaluation of stage I right breast cancer. She has had no problems with her biopsy except for some mild bruising of the right breast.  Review of Systems: A complete review of systems was obtained from the patient. I have reviewed this information and discussed as appropriate with the patient. See HPI as well for other ROS.  Review of Systems  Constitutional: Negative.  HENT: Negative.  Eyes: Negative.  Respiratory: Negative.  Cardiovascular: Negative.  Gastrointestinal: Negative.  Genitourinary: Negative.  Musculoskeletal: Negative.  Skin: Negative.  Neurological: Negative.  Endo/Heme/Allergies: Negative.  Psychiatric/Behavioral: Negative.    Medical History: Past Medical History:  Diagnosis Date   B12 deficiency  05/05/2020   Fracture of humerus, proximal, left, closed  10/28/2011   GERD (gastroesophageal reflux disease)   Patient Active Problem List  Diagnosis   Malignant neoplasm of upper-outer quadrant of right breast in female, estrogen receptor positive (CMS-HCC)   Hyperlipidemia   Elevated blood pressure reading without diagnosis of hypertension   Invasive ductal carcinoma of breast, female, right (CMS-HCC)   Past Surgical History:  Procedure Laterality Date   ARTHROPLASTY TOTAL KNEE Right   08/04/2013    Allergies  Allergen Reactions   Memantine Swelling   Codeine Nausea And Vomiting   Propranolol Other (See Comments)  Made her head feel very funny Made her head feel very funny Made her head feel very funny Made her head feel very funny Made her head feel very funny Made her head feel very funny   Sulfa (Sulfonamide Antibiotics) Itching and Rash  REACTION: unspecified REACTION: unspecified REACTION: unspecified REACTION: unspecified REACTION: unspecified REACTION: unspecified REACTION: unspecified REACTION: unspecified   Current Outpatient Medications on File Prior to Visit  Medication Sig Dispense Refill   diazePAM (VALIUM) 2 MG tablet diazepam 2 mg tablet   ergocalciferol, vitamin D2, 1,250 mcg (50,000 unit) capsule   hyoscyamine (LEVSIN) 0.125 mg tablet Take by mouth   omeprazole (PRILOSEC) 20 MG DR capsule   primidone (MYSOLINE) 250 MG tablet Take 250 mg by mouth 2 (two) times daily   No current facility-administered medications on file prior to visit.   Family History  Problem Relation Age of Onset   Stroke Mother   Kidney disease Father   Chronic kidney disease Father   Cancer Maternal Grandmother    Social History   Tobacco Use  Smoking Status Never Smoker  Smokeless Tobacco Never Used    Social History   Socioeconomic History   Marital status: Unknown  Tobacco Use   Smoking status: Never Smoker   Smokeless tobacco: Never Used  Scientific laboratory technician Use: Never used  Substance and Sexual Activity   Alcohol use: Defer   Drug use: Defer   Objective:   There were no vitals filed for this visit.  There is  no height or weight on file to calculate BMI.  Physical Exam Constitutional:  Appearance: Normal appearance.  HENT:  Head: Normocephalic.  Nose: Nose normal.  Mouth/Throat:  Mouth: Mucous membranes are moist.  Eyes:  Pupils: Pupils are equal, round, and reactive to light.  Cardiovascular:  Rate and Rhythm: Normal rate  and regular rhythm.  Pulmonary:  Effort: Pulmonary effort is normal.  Breath sounds: Normal breath sounds.  Chest:  Breasts:  Left: Normal.   Musculoskeletal:  General: Normal range of motion.  Cervical back: Normal range of motion.  Lymphadenopathy:  Upper Body:  Right upper body: No supraclavicular or axillary adenopathy.  Left upper body: No supraclavicular or axillary adenopathy.  Skin: General: Skin is warm.  Neurological:  General: No focal deficit present.  Mental Status: She is alert.  Psychiatric:  Mood and Affect: Mood normal.  Behavior: Behavior normal.     Labs, Imaging and Diagnostic Testing:  Diagnosis 1. Breast, right, needle core biopsy, 12:00 3cm fn, ribbon clip - INVASIVE DUCTAL CARCINOMA. SEE NOTE - DUCTAL CARCINOMA IN SITU, LOW-GRADE 2. Lymph node, needle/core biopsy, right axilla, q clip - BENIGN LYMPH NODE Diagnosis Note 1. Carcinoma measures 1.2 cm in greatest linear dimension and appears grade 2. Dr. Saralyn Pilar reviewed the case and concurs with the diagnosis. A breast prognostic profile (ER, PR, Ki-67 and HER2) is pending and will be reported in an addendum. The [facility] was notified on 03/24/2021. Jaquita Folds MD Pathologist, Electronic Signature (Case signed 03/24/2021) Specimen Gross and Clinical Information Specimen Comment 1. CIT: <7mns, TIF: 1421 hours 2. TIF: 1446 hours, CIT: <520ms Specimen(s) Obtained: 1. Breast, right, needle core biopsy, 12:00 3cm fn, ribbon clip 2. Lymph node, needle/core biopsy, right axilla, q clip Specimen Clinical Information 1. Highly suspicious mass concerning for malignancy 2. Axillary node with borderline cortical thickening, R/O mets  Diffuse, nonfocal pain across the upper left breast, outer left breast and extending into the upper back.   EXAM: DIGITAL DIAGNOSTIC BILATERAL MAMMOGRAM WITH TOMOSYNTHESIS AND CAD; ULTRASOUND LEFT BREAST LIMITED; ULTRASOUND RIGHT BREAST  LIMITED   TECHNIQUE: Bilateral digital diagnostic mammography and breast tomosynthesis was performed. The images were evaluated with computer-aided detection.; Targeted ultrasound examination of the left breast was performed.; Targeted ultrasound examination of the right breast was performed   COMPARISON: Previous exam(s).   ACR Breast Density Category b: There are scattered areas of fibroglandular density.   FINDINGS: There is an elongated, oval asymmetry in posterior left breast in the 12 o'clock position. There is a spiculated mass in the mid right breast in the 12:30 o'clock position. The previously demonstrated normal intramammary lymph node in the outer right breast is unchanged since 10/06/2011. There are several right axillary lymph nodes which are mildly larger with mildly thicker cortices compared to the left axillary nodes.   On physical exam, there is an approximately 2 cm firm, palpable mass in the 12 o'clock position of the right breast, 3 cm from the nipple. No mass or palpable thickening in the upper left breast. No palpable lymph nodes in either axilla.   Targeted ultrasound is performed, showing normal appearing breast tissue throughout the upper left breast, including elongated fibroglandular tissue, corresponding to the mammographic asymmetry.   There is a 1.5 x 1.5 x 1.1 cm irregular, hypoechoic mass with dense posterior acoustical shadowing in the 12 o'clock position of the right breast, 3 cm from nipple. This corresponds to the palpable and mammographic mass.   Ultrasound of the right axilla  demonstrated 2 lymph nodes with borderline eccentric cortical thickening with a maximum thickness of 3.4 mm.   IMPRESSION: 1. 1.5 cm mass with imaging features highly suspicious for malignancy in the 12 o'clock position of the right breast. 2. 2 right axillary lymph nodes with borderline eccentric cortical thickening, suspicious for possible metastatic  nodes. 3. No evidence of malignancy on the left.   RECOMMENDATION: 1. Ultrasound-guided core needle biopsy of the 1.5 cm mass in the 12 o'clock position of the right breast. 2. Ultrasound-guided core needle biopsy of 1 of the right axillary lymph nodes with borderline eccentric cortical thickening. This has been discussed with the patient and the biopsies have been scheduled at 8:30 a.m. on 03/08/2021.   I have discussed the findings and recommendations with the patient. If applicable, a reminder letter will be sent to the patient regarding the next appointment.   BI-RADS CATEGORY 5: Highly suggestive of malignancy.     Electronically Signed By: Claudie Revering M.D. On: 02/28/2021 11:17  Assessment and Plan:  Diagnoses and all orders for this visit:  Malignant neoplasm of upper-outer quadrant of right breast in female, estrogen receptor positive (CMS-HCC)    Patient seen in the multidisciplinary breast clinic today. Evaluated by medical oncology, radiation oncology and surgery. She is opted for right breast seed localized lumpectomy with right axillary sentinel lymph node mapping. Risks and benefits of surgery discussed. Complications of bleeding, infection, cosmetic deformity, blood clot, stroke, death, need for further surgery and reexcision, lymphedema, arm and shoulder stiffness, and other possible treatments discussed with the patient today. She agrees to proceed with breast conserving surgery.  No follow-ups on file.  Kennieth Francois, MD   I spent a total of 45 minutes in both face-to-face and non-face-to-face activities for this visit on the date of this encounter.

## 2021-04-20 NOTE — Anesthesia Procedure Notes (Signed)
Anesthesia Regional Block: Pectoralis block   Pre-Anesthetic Checklist: , timeout performed,  Correct Patient, Correct Site, Correct Laterality,  Correct Procedure, Correct Position, site marked,  Risks and benefits discussed,  Surgical consent,  Pre-op evaluation,  At surgeon's request and post-op pain management  Laterality: Right  Prep: chloraprep       Needles:  Injection technique: Single-shot  Needle Type: Echogenic Stimulator Needle     Needle Length: 5cm  Needle Gauge: 22     Additional Needles:   Procedures:, nerve stimulator,,, ultrasound used (permanent image in chart),,    Narrative:  Start time: 04/20/2021 1:00 PM End time: 04/20/2021 1:05 PM Injection made incrementally with aspirations every 5 mL.  Performed by: Personally  Anesthesiologist: Janeece Riggers, MD  Additional Notes: Functioning IV was confirmed and monitors were applied.  A 39mm 22ga Arrow echogenic stimulator needle was used. Sterile prep and drape,hand hygiene and sterile gloves were used. Ultrasound guidance: relevant anatomy identified, needle position confirmed, local anesthetic spread visualized around nerve(s)., vascular puncture avoided.  Image printed for medical record. Negative aspiration and negative test dose prior to incremental administration of local anesthetic. The patient tolerated the procedure well.

## 2021-04-20 NOTE — Op Note (Signed)
Preoperative diagnosis: Stage I right breast cancer upper outer quadrant  Postoperative diagnosis: Same  Procedure: Right breast seed localized lumpectomy with right axillary sentinel lymph node mapping using Lymphoseek and mag trace  Surgeon: Erroll Luna, MD  Anesthesia: LMA with pectoral block and 0.25% Marcaine plain  EBL: 20 cc  Specimen: Right breast mass with seed and clip verified by Faxitron  Right axillary sentinel nodes x3 with 1 being dark color consistent with mag trace use  Drains: None  Indications for procedure: The patient is 87 year old female with a stage I right breast cancer.  Given her histology, sentinel node mapping was recommended.  This may influence chemotherapy options radiation therapy for her.  We discussed the procedure.  She did not wish to undergo mastectomy due to her advanced age which I felt was reasonable.  I explained survival is similar between mastectomy and breast conserving surgery.  We discussed the risk of bleeding, infection, cosmetic deformity, wound infection, poor wound healing, lymphedema, arm stiffness, numbness, injury to major vessel structures, death, DVT, exacerbation of Medical problems, and the need further treatments and/or surgeries and/or procedures.  She voiced understanding and agreed to proceed.  Description of procedure: The patient was met in the holding area and questions were answered.  The right breast was marked as correct site.  A timeout was done for injection of mag trace.  The right breast was marked as the correct site.  Timeout was done.  Under sterile conditions using 2% lidocaine local 3 cc of mag trace was injected.  This was tolerated well.  She will also underwent pectoral block and injection of Lymphoseek by [facility].  All questions were answered.  Films were available for review.  She was taken back to the operative room.  She was placed supine upon the OR table.  After induction of general esthesia the right  breast was prepped and draped in sterile fashion timeout performed.  She received appropriate antibiotics.  Neoprobe used to identify the seed in the right breast upper outer quadrant.  Transverse incision was made over this after infiltration local anesthetic.  Dissection was carried down around the mass and all tissue around the mass was excised.  The margins appeared grossly negative but the anterior lateral margins were close took an additional margin from there.  Hemostasis achieved.  The Faxitron revealed the seed and clips to be in specimen.  Cavity was irrigated.  It was made hemostatic with cautery.  Vancomycin powder was placed.  Cavity then closed with 3-0 Vicryl and 4 Monocryl.  Knee MAC trace probe was used.  Hotspot identified in the right axilla.  A 4 cm incision was made.  Dissection was carried down into the deep lymph node basin and a small node identified.  He had a brown appearance consistent with MAC trace use.  We excised at a sentinel node.  He had a good signal.  There were 2 additional areas of signal intensity I took out.  This appeared to be 2 additional nodes.  Background counts approached baseline.  There is no evidence of bleeding.  Irrigation was used and suctioned out.  The long thoracic nerve thoracodorsal trunk and axillary vein were preserved.  After ensuring hemostasis, the wound was closed with a deep layer of 3-0 Vicryl and 4 Monocryl.  Dermabond applied to all incisions.  Breast binder placed.  All counts were counted and found to be correct.  The patient was then awoke extubated taken to recovery in satisfactory condition.

## 2021-04-20 NOTE — Anesthesia Procedure Notes (Signed)
Procedure Name: Intubation Date/Time: 04/20/2021 2:04 PM Performed by: Tawni Millers, CRNA Pre-anesthesia Checklist: Patient identified, Emergency Drugs available, Suction available and Patient being monitored Patient Re-evaluated:Patient Re-evaluated prior to induction Oxygen Delivery Method: Circle system utilized Preoxygenation: Pre-oxygenation with 100% oxygen Induction Type: IV induction Ventilation: Mask ventilation without difficulty Laryngoscope Size: Mac and 3 Grade View: Grade I Tube type: Oral Number of attempts: 1 Airway Equipment and Method: Stylet and Oral airway Placement Confirmation: ETT inserted through vocal cords under direct vision, positive ETCO2 and breath sounds checked- equal and bilateral Tube secured with: Tape Dental Injury: Teeth and Oropharynx as per pre-operative assessment

## 2021-04-20 NOTE — Progress Notes (Signed)
Emotional support provided through nuclear medicine breast injections. Vital signs stable. Patient tolerated well.  

## 2021-04-20 NOTE — Discharge Instructions (Addendum)
[facility] Office Phone Number [phone]  BREAST BIOPSY/ PARTIAL MASTECTOMY: POST OP INSTRUCTIONS  Always review your discharge instruction sheet given to you by the facility where your surgery was performed.  IF YOU HAVE DISABILITY OR FAMILY LEAVE FORMS, YOU MUST BRING THEM TO THE OFFICE FOR PROCESSING.  DO NOT GIVE THEM TO YOUR DOCTOR.  A prescription for pain medication may be given to you upon discharge.  Take your pain medication as prescribed, if needed.  If narcotic pain medicine is not needed, then you may take acetaminophen (Tylenol) or ibuprofen (Advil) as needed. Take your usually prescribed medications unless otherwise directed If you need a refill on your pain medication, please contact your pharmacy.  They will contact our office to request authorization.  Prescriptions will not be filled after 5pm or on week-ends. You should eat very light the first 24 hours after surgery, such as soup, crackers, pudding, etc.  Resume your normal diet the day after surgery. Most patients will experience some swelling and bruising in the breast.  Ice packs and a good support bra will help.  Swelling and bruising can take several days to resolve.  It is common to experience some constipation if taking pain medication after surgery.  Increasing fluid intake and taking a stool softener will usually help or prevent this problem from occurring.  A mild laxative (Milk of Magnesia or Miralax) should be taken according to package directions if there are no bowel movements after 48 hours. Unless discharge instructions indicate otherwise, you may remove your bandages 24-48 hours after surgery, and you may shower at that time.  You may have steri-strips (small skin tapes) in place directly over the incision.  These strips should be left on the skin for 7-10 days.  If your surgeon used skin glue on the incision, you may shower in 24 hours.  The glue will flake off over the next 2-3 weeks.  Any  sutures or staples will be removed at the office during your follow-up visit. ACTIVITIES:  You may resume regular daily activities (gradually increasing) beginning the next day.  Wearing a good support bra or sports bra minimizes pain and swelling.  You may have sexual intercourse when it is comfortable. You may drive when you no longer are taking prescription pain medication, you can comfortably wear a seatbelt, and you can safely maneuver your car and apply brakes. RETURN TO WORK:  ______________________________________________________________________________________ You should see your doctor in the office for a follow-up appointment approximately two weeks after your surgery.  Your doctor's nurse will typically make your follow-up appointment when she calls you with your pathology report.  Expect your pathology report 2-3 business days after your surgery.  You may call to check if you do not hear from us after three days. OTHER INSTRUCTIONS: _______________________________________________________________________________________________ _____________________________________________________________________________________________________________________________________ _____________________________________________________________________________________________________________________________________ _____________________________________________________________________________________________________________________________________  WHEN TO CALL YOUR DOCTOR: Fever over 101.0 Nausea and/or vomiting. Extreme swelling or bruising. Continued bleeding from incision. Increased pain, redness, or drainage from the incision.  The clinic staff is available to answer your questions during regular business hours.  Please don't hesitate to call and ask to speak to one of the nurses for clinical concerns.  If you have a medical emergency, go to the nearest emergency room or call 911.  A surgeon from [facility] is always on call at the hospital.  For further questions, please visit [email or web address]    Post Anesthesia Home Care Instructions  Activity: Get plenty of rest for the remainder of   the day. A responsible individual must stay with you for 24 hours following the procedure.  For the next 24 hours, DO NOT: -Drive a car -Operate machinery -Drink alcoholic beverages -Take any medication unless instructed by your physician -Make any legal decisions or sign important papers.  Meals: Start with liquid foods such as gelatin or soup. Progress to regular foods as tolerated. Avoid greasy, spicy, heavy foods. If nausea and/or vomiting occur, drink only clear liquids until the nausea and/or vomiting subsides. Call your physician if vomiting continues.  Special Instructions/Symptoms: Your throat may feel dry or sore from the anesthesia or the breathing tube placed in your throat during surgery. If this causes discomfort, gargle with warm salt water. The discomfort should disappear within 24 hours.  If you had a scopolamine patch placed behind your ear for the management of post- operative nausea and/or vomiting:  1. The medication in the patch is effective for 72 hours, after which it should be removed.  Wrap patch in a tissue and discard in the trash. Wash hands thoroughly with soap and water. 2. You may remove the patch earlier than 72 hours if you experience unpleasant side effects which may include dry mouth, dizziness or visual disturbances. 3. Avoid touching the patch. Wash your hands with soap and water after contact with the patch.      

## 2021-04-20 NOTE — Anesthesia Procedure Notes (Signed)
Procedure Name: LMA Insertion Date/Time: 04/20/2021 1:46 PM Performed by: Tawni Millers, CRNA Pre-anesthesia Checklist: Patient identified, Emergency Drugs available, Suction available and Patient being monitored Patient Re-evaluated:Patient Re-evaluated prior to induction Oxygen Delivery Method: Circle system utilized Preoxygenation: Pre-oxygenation with 100% oxygen Induction Type: IV induction Ventilation: Mask ventilation without difficulty LMA: LMA inserted LMA Size: 4.0 Number of attempts: 1 Airway Equipment and Method: Bite block Placement Confirmation: positive ETCO2 Tube secured with: Tape Dental Injury: Teeth and Oropharynx as per pre-operative assessment

## 2021-04-20 NOTE — Anesthesia Preprocedure Evaluation (Signed)
Anesthesia Evaluation  Patient identified by MRN, date of birth, ID band Patient awake    Reviewed: Allergy & Precautions, H&P , NPO status , Patient's Chart, lab work & pertinent test results, reviewed documented beta blocker date and time   Airway Mallampati: II  TM Distance: >3 FB Neck ROM: full    Dental no notable dental hx. (+) Teeth Intact, Dental Advisory Given, Caps   Pulmonary sleep apnea ,    Pulmonary exam normal breath sounds clear to auscultation       Cardiovascular Exercise Tolerance: Good negative cardio ROS   Rhythm:regular Rate:Normal  ECHO 11/21 1. Left ventricular ejection fraction, by estimation, is 60 to 65%. The  left ventricle has normal function. The left ventricle has no regional  wall motion abnormalities. Left ventricular diastolic parameters are  consistent with Grade I diastolic  dysfunction (impaired relaxation). Elevated left ventricular end-diastolic  pressure.    Neuro/Psych PSYCHIATRIC DISORDERS Anxiety Depression  Neuromuscular disease    GI/Hepatic Neg liver ROS, hiatal hernia, GERD  Medicated and Controlled,  Endo/Other  negative endocrine ROS  Renal/GU negative Renal ROS  negative genitourinary   Musculoskeletal   Abdominal   Peds  Hematology negative hematology ROS (+)   Anesthesia Other Findings   Reproductive/Obstetrics negative OB ROS                             Anesthesia Physical Anesthesia Plan  ASA: 3  Anesthesia Plan: General   Post-op Pain Management:    Induction: Intravenous  PONV Risk Score and Plan: 3 and Ondansetron, Dexamethasone and Midazolam  Airway Management Planned: LMA and Oral ETT  Additional Equipment: None  Intra-op Plan:   Post-operative Plan:   Informed Consent: I have reviewed the patients History and Physical, chart, labs and discussed the procedure including the risks, benefits and alternatives for the  proposed anesthesia with the patient or authorized representative who has indicated his/her understanding and acceptance.     Dental Advisory Given  Plan Discussed with: CRNA and Anesthesiologist  Anesthesia Plan Comments: ( )        Anesthesia Quick Evaluation

## 2021-04-20 NOTE — Transfer of Care (Signed)
Immediate Anesthesia Transfer of Care Note  Patient: [name]  Procedure(s) Performed: RIGHT BREAST LUMPECTOMY WITH RADIOACTIVE SEED AND SENTINEL LYMPH NODE BIOPSY (Right: Breast)  Patient Location: PACU  Anesthesia Type:General  Level of Consciousness: awake, alert , oriented, drowsy and patient cooperative  Airway & Oxygen Therapy: Patient Spontanous Breathing and Patient connected to face mask oxygen  Post-op Assessment: Report given to RN and Post -op Vital signs reviewed and stable  Post vital signs: Reviewed and stable  Last Vitals:  Vitals Value Taken Time  BP    Temp    Pulse    Resp    SpO2      Last Pain:  Vitals:   04/20/21 1127  TempSrc: Oral  PainSc: 0-No pain      Patients Stated Pain Goal: 7 (34/28/76 8115)  Complications: No notable events documented.

## 2021-04-20 NOTE — Anesthesia Postprocedure Evaluation (Signed)
Anesthesia Post Note  Patient: [name]  Procedure(s) Performed: RIGHT BREAST LUMPECTOMY WITH RADIOACTIVE SEED AND SENTINEL LYMPH NODE BIOPSY (Right: Breast)     Patient location during evaluation: PACU Anesthesia Type: General Level of consciousness: awake and alert Pain management: pain level controlled Vital Signs Assessment: post-procedure vital signs reviewed and stable Respiratory status: spontaneous breathing, nonlabored ventilation, respiratory function stable and patient connected to nasal cannula oxygen Cardiovascular status: blood pressure returned to baseline and stable Postop Assessment: no apparent nausea or vomiting Anesthetic complications: no   No notable events documented.  Last Vitals:  Vitals:   04/20/21 1515 04/20/21 1530  BP: (!) 122/50 (!) 150/79  Pulse: 87 83  Resp: 15 17  Temp:    SpO2: 94% 93%    Last Pain:  Vitals:   04/20/21 1530  TempSrc:   PainSc: 4                  Jesse Nosbisch

## 2021-04-20 NOTE — Progress Notes (Signed)
Assisted Dr. Oddono with right, ultrasound guided, pectoralis block. Side rails up, monitors on throughout procedure. See vital signs in flow sheet. Tolerated Procedure well. 

## 2021-04-21 ENCOUNTER — Encounter (HOSPITAL_BASED_OUTPATIENT_CLINIC_OR_DEPARTMENT_OTHER): Payer: Self-pay | Admitting: Surgery

## 2021-04-26 ENCOUNTER — Ambulatory Visit: Payer: Medicare Other

## 2021-04-27 ENCOUNTER — Encounter: Payer: Self-pay | Admitting: *Deleted

## 2021-04-27 LAB — SURGICAL PATHOLOGY

## 2021-04-28 ENCOUNTER — Other Ambulatory Visit: Payer: Self-pay

## 2021-04-28 ENCOUNTER — Encounter: Payer: Self-pay | Admitting: Hematology

## 2021-04-28 ENCOUNTER — Encounter: Payer: Self-pay | Admitting: Surgery

## 2021-04-28 ENCOUNTER — Inpatient Hospital Stay: Payer: Medicare Other | Attending: Hematology | Admitting: Hematology

## 2021-04-28 VITALS — BP 152/73 | HR 67 | Temp 98.4°F | Resp 18 | Ht 64.0 in | Wt 174.3 lb

## 2021-04-28 DIAGNOSIS — Z79899 Other long term (current) drug therapy: Secondary | ICD-10-CM | POA: Insufficient documentation

## 2021-04-28 DIAGNOSIS — C50411 Malignant neoplasm of upper-outer quadrant of right female breast: Secondary | ICD-10-CM | POA: Diagnosis not present

## 2021-04-28 DIAGNOSIS — E2839 Other primary ovarian failure: Secondary | ICD-10-CM

## 2021-04-28 DIAGNOSIS — Z170 Estrogen receptor positive status [ER+]: Secondary | ICD-10-CM | POA: Insufficient documentation

## 2021-04-28 DIAGNOSIS — M8580 Other specified disorders of bone density and structure, unspecified site: Secondary | ICD-10-CM | POA: Diagnosis not present

## 2021-04-28 DIAGNOSIS — Z79811 Long term (current) use of aromatase inhibitors: Secondary | ICD-10-CM | POA: Insufficient documentation

## 2021-04-28 NOTE — Progress Notes (Signed)
[name]   [phone] [phone]   Clinic Follow up Note   Patient Care Team: [name], MD as PCP - General (Family Medicine) Janace Litten, MD as Referring Physician (Neurology) Roel Cluck, MD as Referring Physician (Ophthalmology) Jacques Navy, MD as Referring Physician (Neurology) Rozetta Nunnery, MD as Consulting Physician (Otolaryngology) Erroll Luna, MD as Consulting Physician (General Surgery) Truitt Merle, MD as Consulting Physician (Hematology) Gery Pray, MD as Consulting Physician (Radiation Oncology) Mauro Kaufmann, RN as Oncology Nurse Navigator Rockwell Germany, RN as Oncology Nurse Navigator  Date of Service:  04/28/2021  CHIEF COMPLAINT: f/u of right breast cancer  CURRENT THERAPY:  Pending antiestrogens  ASSESSMENT & PLAN:  [name] is a 87 y.o. female with   1. Malignant neoplasm of upper-outer quadrant of right breast, Stage IA, c(T1c, N0), ER+/PR+/HER2-, Grade 2 -presented with left breast pain, mammogram showed 1.5 cm right breast mass at 12 o'clock and two suspicious lymph nodes. Biopsy on 03/23/21 showed: invasive ductal carcinoma, grade 2; and DCIS, low grade; lymph node biopsy was benign. -she underwent lumpectomy on 04/20/21 and pathology showed IDC, grade 2, 1.7 cm; DCIS, grade 1-2; negative margins and lymph nodes. -I will review her case with Dr. Sondra Come. The patient would prefer to avoid radiation. Given her early stage disease and advanced age, it is reasonable to forgo radiation. -Given the strong ER and PR expression in her postmenopausal status, I recommend adjuvant endocrine therapy with aromatase inhibitor or Tamoxifen for a total of 5 years to reduce the risk of cancer recurrence.  -The potential benefit and side effects of AI, which includes but not limited to, hot flash, skin and vaginal dryness, metabolic changes ( increased blood glucose, cholesterol, weight, etc.),  slightly in increased risk of cardiovascular disease, cataracts, muscular and joint discomfort, osteopenia and osteoporosis, etc, were discussed with her in great  ---The potential side effects of tamoxifen, which includes but not limited to, hot flash, skin and vaginal dryness, slightly increased risk of cardiovascular disease and cataract, small risk of thrombosis and endometrial cancer, were discussed with her in great details. Preventive strategies for thrombosis, such as being physically active, using compression stocks, avoid cigarette smoking, etc., were reviewed with her.  -I recommend repeat DEXA to see if her density has worsened. Depending on the results, I will prescribe either anastrozole or tamoxifen. She is agreeable to proceed. -We also discussed the breast cancer surveillance after her surgery. She will continue annual screening mammogram, self exam, and a routine office visit with lab and exam with [place]. -I encouraged her to have healthy diet and exercise regularly.    2. Osteopenia -Her most recent DEXA was 04/13/17 showing osteopenia (T-score of -1.7). Will plan for repeat soon. -She is taking vit D.   3. Left chest pain and tenderness  -uncertain of etiology -chest CT 04/11/21 showed no acute abnormalities.  4. Adrenal Nodule -seen on chest CT 04/11/21, measuring 17 mm and considered indeterminate. -she is scheduled for adrenal CT on 05/06/21.     PLAN:  -DEXA in near future  -will determine antiestrogen based on results (anastrozole vs Tamoxifen) and call in  -survivorship with Lacie in 3 months -lab and f/u in 6 months   No problem-specific Assessment & Plan notes found for this encounter.   SUMMARY OF ONCOLOGIC HISTORY: Oncology History Overview Note  Cancer Staging Malignant neoplasm of upper-outer quadrant of right breast in female, estrogen receptor positive ([name]) Staging form:  Breast, AJCC 8th Edition - Clinical stage from 03/23/2021: Stage IA (cT1c, cN0, cM0,  G2, ER+, PR+, HER2-) - Signed by Truitt Merle, MD on 03/30/2021 - Pathologic stage from 04/20/2021: Stage IA (pT1c, pN0, cM0, G2, ER+, PR+, HER2-) - Signed by Truitt Merle, MD on 04/28/2021     Malignant neoplasm of upper-outer quadrant of right breast in female, estrogen receptor positive ([name])  02/28/2021 Mammogram   Bilateral Diagnostic and Bilateral Breast Ultrasound  IMPRESSION: 1. 1.5 cm mass with imaging features highly suspicious for malignancy in the 12 o'clock position of the right breast. 2. 2 right axillary lymph nodes with borderline eccentric cortical thickening, suspicious for possible metastatic nodes. 3. No evidence of malignancy on the left.   03/23/2021 Cancer Staging   Staging form: Breast, AJCC 8th Edition - Clinical stage from 03/23/2021: Stage IA (cT1c, cN0, cM0, G2, ER+, PR+, HER2-) - Signed by Truitt Merle, MD on 03/30/2021 Stage prefix: Initial diagnosis Histologic grading system: 3 grade system   03/23/2021 Pathology Results   Diagnosis 1. Breast, right, needle core biopsy, 12:00 3cm fn, ribbon clip - INVASIVE DUCTAL CARCINOMA. SEE NOTE - DUCTAL CARCINOMA IN SITU, LOW-GRADE 2. Lymph node, needle/core biopsy, right axilla, q clip - BENIGN LYMPH NODE Diagnosis Note 1. Carcinoma measures 1.2 cm in greatest linear dimension and appears grade 2.  Prognostic panel pending.   03/28/2021 Initial Diagnosis   Malignant neoplasm of upper-outer quadrant of right breast in female, estrogen receptor positive ([name])   04/20/2021 Cancer Staging   Staging form: Breast, AJCC 8th Edition - Pathologic stage from 04/20/2021: Stage IA (pT1c, pN0, cM0, G2, ER+, PR+, HER2-) - Signed by Truitt Merle, MD on 04/28/2021 Stage prefix: Initial diagnosis Multigene prognostic tests performed: None Histologic grading system: 3 grade system Residual tumor (R): R0 - None   04/20/2021 Pathology Results   FINAL MICROSCOPIC DIAGNOSIS:   A. BREAST, RIGHT, LUMPECTOMY:  -  Invasive ductal carcinoma,  [place] grade 2 and of 3, 1.7 cm  -  Ductal carcinoma in-situ, low to intermediate grade  -  Margins uninvolved by carcinoma (see part B below)  -  Previous biopsy site changes present  -  See oncology table and comment below   B. BREAST, RIGHT ADDITIONAL ANTERIOR-LATERAL MARGIN, EXCISION:  -  No residual carcinoma identified   C. LYMPH NODE, RIGHT AXILLARY, SENTINEL, EXCISION:  -  No carcinoma identified in one lymph node (0/1)   D. LYMPH NODE, RIGHT AXILLARY, SENTINEL, EXCISION:  -  No carcinoma identified in one lymph node (0/1)       INTERVAL HISTORY:  [name] is here for a follow up of breast cancer. She was last seen by me on 03/30/21 in consultation. She presents to the clinic accompanied by her daughter Jenny Reichmann. She reports she has been recovering relatively well from surgery. She reports some discomfort to her axilla incision area. They expressed concern with something Jenny Reichmann was told following surgery. Jenny Reichmann states she was told there was something in [name]'s chest cavity. I cannot find an indication of this, either in his note or her recent chest CT. The only unsure thing on the CT was an adrenal nodule, for which she is already scheduled for follow up CT.   All other systems were reviewed with the patient and are negative.  MEDICAL HISTORY:  Past Medical History:  Diagnosis Date   Abnormal results of liver function studies 05/15/2007   Allergy    Anal fissure 09/26/2007   Formatting of this note might  be different from the original. Qualifier: Diagnosis of  By: Nils Pyle CMA (AAMA), Leisha   Anxiety    B12 deficiency 05/05/2020   Chicken pox    Essential tremor    of head, sometimes hands.  pt takes primidone to help the tremors-DR. MILLER-NEUROLOGIST IN [place].  PT WAS STARTED ON VALUIM MANY YEARS AGO FOR HER TREMORS--AND CONTINUES TO TAKE.   Fracture of humerus, proximal, left, closed 10/28/2011   pt has finished physical therapy-limited ROM reaching to her  back and unable to lift heavy things with left hand/arm   GERD (gastroesophageal reflux disease)    H/O hiatal hernia    Hereditary and idiopathic peripheral neuropathy 03/27/2014   Hx of colonic polyps    Osteoarthritis    PAIN AND OA BILATERAL KNEES; ALSO HAS ARTHRITIS IN HANDS AND BACK BUT NO BACK PAIN   Other chronic otitis externa 03/01/2009   Formatting of this note might be different from the original. Qualifier: Diagnosis of  By: Arnoldo Morale MD, Balinda Quails    SURGICAL HISTORY: Past Surgical History:  Procedure Laterality Date   arthroscopy left knee     arthroscopy right knee     bmp  07/17/2000   BREAST BIOPSY Right 03/23/2021   x2   BREAST LUMPECTOMY WITH RADIOACTIVE SEED AND SENTINEL LYMPH NODE BIOPSY Right 04/20/2021   Procedure: RIGHT BREAST LUMPECTOMY WITH RADIOACTIVE SEED AND SENTINEL LYMPH NODE BIOPSY;  Surgeon: Erroll Luna, MD;  Location: [facility];  Service: General;  Laterality: Right;   CATARACT EXTRACTION, BILATERAL     COLONOSCOPY W/ POLYPECTOMY     DILATION AND CURETTAGE OF UTERUS     RIGHT FOOT SURGERIES X 2     TOTAL KNEE ARTHROPLASTY  03/13/2012   Procedure: TOTAL KNEE ARTHROPLASTY;  Surgeon: Gearlean Alf, MD;  Location: [facility];  Service: Orthopedics;  Laterality: Left;  steroid injection right knee   TOTAL KNEE ARTHROPLASTY Right 08/04/2013   Procedure: RIGHT TOTAL KNEE ARTHROPLASTY;  Surgeon: Gearlean Alf, MD;  Location: [facility];  Service: Orthopedics;  Laterality: Right;    I have reviewed the social history and family history with the patient and they are unchanged from previous note.  ALLERGIES:  is allergic to memantine, propranolol, codeine, and sulfamethoxazole.  MEDICATIONS:  Current Outpatient Medications  Medication Sig Dispense Refill   diazepam (VALIUM) 5 MG tablet Take 1 tablet (5 mg total) by mouth every 8 (eight) hours as needed for anxiety. 90 tablet 1   HYDROcodone-acetaminophen (NORCO/VICODIN) 5-325 MG tablet Take 1  tablet by mouth every 6 (six) hours as needed for moderate pain. 15 tablet 0   hyoscyamine (LEVSIN) 0.125 MG tablet Take 1 tablet (0.125 mg total) by mouth every 4 (four) hours as needed for cramping. 60 tablet 1   ibuprofen (ADVIL) 800 MG tablet Take 1 tablet (800 mg total) by mouth every 8 (eight) hours as needed. 30 tablet 0   MYSOLINE 250 MG tablet Take 250 mg by mouth 2 (two) times daily.     omeprazole (PRILOSEC) 20 MG capsule Take 1 capsule (20 mg total) by mouth daily. 90 capsule 3   Vitamin D, Ergocalciferol, (DRISDOL) 1.25 MG (50000 UNIT) CAPS capsule Take 1 capsule (50,000 Units total) by mouth every 7 (seven) days. 5 capsule 3   No current facility-administered medications for this visit.    PHYSICAL EXAMINATION: ECOG PERFORMANCE STATUS: 1 - Symptomatic but completely ambulatory  Vitals:   04/28/21 1403  BP: (!) 152/73  Pulse: 67  Resp: 18  Temp: 98.4 F (36.9 C)  SpO2: 99%   Wt Readings from Last 3 Encounters:  04/28/21 174 lb 4.8 oz (79.1 kg)  04/20/21 169 lb 8.5 oz (76.9 kg)  04/14/21 169 lb 6.4 oz (76.8 kg)     GENERAL:alert, no distress and comfortable SKIN: skin color, texture, turgor are normal, no rashes or significant lesions EYES: normal, Conjunctiva are pink and non-injected, sclera clear  NECK: supple, thyroid normal size, non-tender, without nodularity LYMPH:  no palpable lymphadenopathy in the cervical, axillary  LUNGS: clear to auscultation and percussion with normal breathing effort HEART: regular rate & rhythm and no murmurs and no lower extremity edema ABDOMEN:abdomen soft, non-tender and normal bowel sounds Musculoskeletal:no cyanosis of digits and no clubbing  NEURO: alert & oriented x 3 with fluent speech, no focal motor/sensory deficits BREAST: No palpable mass, nodules or adenopathy bilaterally. Breast exam benign.   LABORATORY DATA:  I have reviewed the data as listed CBC Latest Ref Rng & Units 03/30/2021 03/17/2021 04/30/2020  WBC 4.0 -  10.5 K/uL 5.0 4.7 4.5  Hemoglobin 12.0 - 15.0 g/dL 13.8 13.3 13.8  Hematocrit 36.0 - 46.0 % 39.6 39.1 40.9  Platelets 150 - 400 K/uL 168 176.0 165.0     CMP Latest Ref Rng & Units 03/30/2021 03/17/2021 04/30/2020  Glucose 70 - 99 mg/dL 95 90 92  BUN 8 - 23 mg/dL _0 Creatinine 0.44 - 1.00 mg/dL 0.85 0.74 0.86  Sodium 135 - 145 mmol/L 139 139 139  Potassium 3.5 - 5.1 mmol/L 4.3 4.6 4.4  Chloride 98 - 111 mmol/L 104 104 103  CO2 22 - 32 mmol/L _1 Calcium 8.9 - 10.3 mg/dL 9.4 9.2 9.0  Total Protein 6.5 - 8.1 g/dL 7.3 7.0 7.0  Total Bilirubin 0.3 - 1.2 mg/dL 0.5 0.5 0.5  Alkaline Phos 38 - 126 U/L 70 66 66  AST 15 - 41 U/L _2 ALT 0 - 44 U/L _3 RADIOGRAPHIC STUDIES: I have personally reviewed the radiological images as listed and agreed with the findings in the report. No results found.    Orders Placed This Encounter  Procedures   DG Bone Density    Standing Status:   Future    Standing Expiration Date:   04/28/2022    Order Specific Question:   Reason for Exam (SYMPTOM  OR DIAGNOSIS REQUIRED)    Answer:   screening    Order Specific Question:   Preferred imaging location?    Answer:   [facility]    All questions were answered. The patient knows to call the clinic with any problems, questions or concerns. No barriers to learning was detected. The total time spent in the appointment was 30 minutes.     Truitt Merle, MD 04/28/2021   I, Wilburn Mylar, am acting as scribe for Truitt Merle, MD.   I have reviewed the above documentation for accuracy and completeness, and I agree with the above.

## 2021-04-29 ENCOUNTER — Other Ambulatory Visit: Payer: Medicare Other

## 2021-05-02 ENCOUNTER — Encounter: Payer: Self-pay | Admitting: *Deleted

## 2021-05-02 DIAGNOSIS — Z170 Estrogen receptor positive status [ER+]: Secondary | ICD-10-CM

## 2021-05-06 ENCOUNTER — Other Ambulatory Visit: Payer: Medicare Other

## 2021-05-06 ENCOUNTER — Other Ambulatory Visit: Payer: Self-pay

## 2021-05-06 ENCOUNTER — Ambulatory Visit
Admission: RE | Admit: 2021-05-06 | Discharge: 2021-05-06 | Disposition: A | Discharge status: Home / Self Care | Payer: Medicare Other | Source: Ambulatory Visit | Attending: Family Medicine | Admitting: Family Medicine

## 2021-05-06 DIAGNOSIS — K449 Diaphragmatic hernia without obstruction or gangrene: Secondary | ICD-10-CM | POA: Diagnosis not present

## 2021-05-06 DIAGNOSIS — R935 Abnormal findings on diagnostic imaging of other abdominal regions, including retroperitoneum: Secondary | ICD-10-CM

## 2021-05-06 DIAGNOSIS — N281 Cyst of kidney, acquired: Secondary | ICD-10-CM | POA: Diagnosis not present

## 2021-05-06 DIAGNOSIS — E279 Disorder of adrenal gland, unspecified: Secondary | ICD-10-CM | POA: Diagnosis not present

## 2021-05-06 DIAGNOSIS — E278 Other specified disorders of adrenal gland: Secondary | ICD-10-CM

## 2021-05-06 DIAGNOSIS — D7389 Other diseases of spleen: Secondary | ICD-10-CM | POA: Diagnosis not present

## 2021-05-06 MED ORDER — IOPAMIDOL (ISOVUE-300) INJECTION 61%
100.0000 mL | Freq: Once | INTRAVENOUS | Status: AC | PRN
  Administered 2021-05-06: 100 mL via INTRAVENOUS

## 2021-05-11 ENCOUNTER — Other Ambulatory Visit: Payer: Self-pay

## 2021-05-12 ENCOUNTER — Ambulatory Visit (INDEPENDENT_AMBULATORY_CARE_PROVIDER_SITE_OTHER): Payer: Medicare Other | Admitting: Family Medicine

## 2021-05-12 VITALS — BP 124/70 | HR 60 | Temp 97.3°F | Ht 64.0 in | Wt 173.4 lb

## 2021-05-12 DIAGNOSIS — Z170 Estrogen receptor positive status [ER+]: Secondary | ICD-10-CM | POA: Diagnosis not present

## 2021-05-12 DIAGNOSIS — Z23 Encounter for immunization: Secondary | ICD-10-CM | POA: Diagnosis not present

## 2021-05-12 DIAGNOSIS — C50411 Malignant neoplasm of upper-outer quadrant of right female breast: Secondary | ICD-10-CM

## 2021-05-12 DIAGNOSIS — G250 Essential tremor: Secondary | ICD-10-CM

## 2021-05-12 DIAGNOSIS — E278 Other specified disorders of adrenal gland: Secondary | ICD-10-CM | POA: Diagnosis not present

## 2021-05-12 NOTE — Progress Notes (Signed)
[facility] [address] [place] [address] Dept: [phone] Dept Fax: [phone]  Office Visit  Subjective:    Patient ID: [name], female    DOB: 01/14/1934, 87 y.o..   MRN: [id number]  Chief Complaint  Patient presents with   Follow-up    4 week f/u. C/o having some dizziness/light headedness x weeks, has gotten worse.  Declines flu shot today.    History of Present Illness:  Patient is in today for reassessment after her recent breast surgery. [name] underwent a right breast lumpectomy and sentinel lymph node biopsy on 10/5. She notes that she is healing well, though she still feels some burning sensation in the surgical wound. The cancer was felt to be Stage I. The oncologist did not feel radiation was necessary. She was offered aromatase inhibitor therapy for 5 years, but [name] decided not to pursue this. The original cancer was found as part of a diagnostic mammogram due to left breast pain. The left breast pain has decreased some, but is still episodically present.  [name] had a CT of the chest in Sept. that showed an incidental right adrenal nodule. Recently, we had a follow-up CT of the abdomen completed tor assess.   [name] notes that her essential tremor is about the same currently,. She is using Valium and primidone for management. She has not been able to follow-up with her neurologist, as he has been out on "maternity leave". She also notes occasional swimmy headed sensation. This has been an ogoign issue. This has not worsened since we increased her Valium dose.   Past Medical History: Patient Active Problem List   Diagnosis Date Noted   Adrenal nodule- likely benign adenoma ([place]) 04/12/2021   Aortic atherosclerosis ([place]) 04/12/2021   Malignant neoplasm of upper-outer quadrant of right breast in female, estrogen receptor positive ([place]) 03/28/2021   Hyperlipidemia 03/18/2021    Invasive ductal carcinoma of breast, female, right ([place]) 02/28/2021   Vitamin D deficiency 08/05/2019   Elevated blood pressure reading without diagnosis of hypertension 04/30/2019   Allergic rhinitis 10/28/2018   OSA (obstructive sleep apnea) 11/28/2017   Iron overload 02/21/2016   Senile nuclear sclerosis 12/10/2014   Gait difficulty 03/27/2014   Low bone mass 10/31/2012   Gastro-esophageal reflux disease without esophagitis 06/25/2012   Fecal incontinence 06/25/2012   Thoracic radiculitis 07/20/2010   Diverticulosis of colon 09/26/2007   Depression with anxiety 09/26/2007   Enlarged lymph nodes 07/26/2007   History of colonic polyps 05/15/2007   Irritable bowel syndrome 05/15/2007   Tremor, essential 02/11/2007   Osteoarthritis of both hands 02/11/2007   Past Surgical History:  Procedure Laterality Date   arthroscopy left knee     arthroscopy right knee     bmp  07/17/2000   BREAST BIOPSY Right 03/23/2021   x2   BREAST LUMPECTOMY WITH RADIOACTIVE SEED AND SENTINEL LYMPH NODE BIOPSY Right 04/20/2021   Procedure: RIGHT BREAST LUMPECTOMY WITH RADIOACTIVE SEED AND SENTINEL LYMPH NODE BIOPSY;  Surgeon: Erroll Luna, MD;  Location: [facility];  Service: General;  Laterality: Right;   CATARACT EXTRACTION, BILATERAL     COLONOSCOPY W/ POLYPECTOMY     DILATION AND CURETTAGE OF UTERUS     RIGHT FOOT SURGERIES X 2     TOTAL KNEE ARTHROPLASTY  03/13/2012   Procedure: TOTAL KNEE ARTHROPLASTY;  Surgeon: Gearlean Alf, MD;  Location: [facility];  Service: Orthopedics;  Laterality: Left;  steroid injection right  knee   TOTAL KNEE ARTHROPLASTY Right 08/04/2013   Procedure: RIGHT TOTAL KNEE ARTHROPLASTY;  Surgeon: Gearlean Alf, MD;  Location: [facility];  Service: Orthopedics;  Laterality: Right;   Family History  Problem Relation Age of Onset   Stroke Mother    Kidney disease Father    Cancer Maternal Grandmother        Leukemia   Kidney failure Other        renal disease    Outpatient Medications Prior to Visit  Medication Sig Dispense Refill   diazepam (VALIUM) 5 MG tablet Take 1 tablet (5 mg total) by mouth every 8 (eight) hours as needed for anxiety. 90 tablet 1   HYDROcodone-acetaminophen (NORCO/VICODIN) 5-325 MG tablet Take 1 tablet by mouth every 6 (six) hours as needed for moderate pain. 15 tablet 0   hyoscyamine (LEVSIN) 0.125 MG tablet Take 1 tablet (0.125 mg total) by mouth every 4 (four) hours as needed for cramping. 60 tablet 1   ibuprofen (ADVIL) 800 MG tablet Take 1 tablet (800 mg total) by mouth every 8 (eight) hours as needed. 30 tablet 0   omeprazole (PRILOSEC) 20 MG capsule Take 1 capsule (20 mg total) by mouth daily. 90 capsule 3   PRIMIDONE PO Take by mouth.     Vitamin D, Ergocalciferol, (DRISDOL) 1.25 MG (50000 UNIT) CAPS capsule Take 1 capsule (50,000 Units total) by mouth every 7 (seven) days. 5 capsule 3   MYSOLINE 250 MG tablet Take 250 mg by mouth 2 (two) times daily.     No facility-administered medications prior to visit.   Allergies  Allergen Reactions   Memantine Swelling   Propranolol Other (See Comments)    Made her head feel very funny Made her head feel very funny    Codeine Nausea And Vomiting   Sulfamethoxazole Rash    REACTION: unspecified   Objective:   Today's Vitals   05/12/21 1037  BP: 124/70  Pulse: 60  Temp: (!) 97.3 F (36.3 C)  TempSrc: Temporal  SpO2: 96%  Weight: 173 lb 6.4 oz (78.7 kg)  Height: 5\' 4"  (1.626 m)   Body mass index is 29.76 kg/m.   General: Well developed, well nourished. No acute distress. Psych: Alert and oriented. Normal mood and affect.  Health Maintenance Due  Topic Date Due   Zoster Vaccines- Shingrix (1 of 2) Never done   TETANUS/TDAP  07/17/2002   COVID-19 Vaccine (3 - Pfizer risk series) 11/24/2019   INFLUENZA VACCINE  02/14/2021     Imaging: CT of Abdomen w wo contrast (05/06/2021) IMPRESSION: 1. Enhancing 1.4 cm right adrenal nodule is stable in size since  at least June 17, 2018 with imaging characteristics consistent with a benign adrenal adenoma. 2. Similar appearance of the prominent/mildly enlarged retroperitoneal lymph nodes dating back to at least CT June 01 2016 with an index aortocaval lymph node measuring 10 mm in short axis previously 11 mm. 3. Small hiatal hernia. 4. Aortic Atherosclerosis (ICD10-I70.0).  Assessment & Plan:   1. Malignant neoplasm of upper-outer quadrant of right breast in female, estrogen receptor positive [facility]) Reviewed surgical an d oncology notes. I agree with [name]'s decision to not take an aromatase inhibitor, as the benefits would be marginal at her age of 22.   2. Adrenal nodule- likely benign adenoma ([place]) We reviewed the results of her recent CT scan. Benign adenoma found and needs no further follow-up. Reassured [name] about this.  3. Tremor, essential Stable on primidone and Valium.  Her "swimmy headed" sensation could be a medication side effect. I recommended she discuss this with her neurologist when she is able to get in with him. I will see her back in 3 months.  [name], MD

## 2021-05-26 ENCOUNTER — Ambulatory Visit: Payer: Medicare Other | Attending: Surgery | Admitting: Physical Therapy

## 2021-05-26 ENCOUNTER — Encounter: Payer: Self-pay | Admitting: Physical Therapy

## 2021-05-26 ENCOUNTER — Other Ambulatory Visit: Payer: Self-pay

## 2021-05-26 DIAGNOSIS — R293 Abnormal posture: Secondary | ICD-10-CM | POA: Insufficient documentation

## 2021-05-26 DIAGNOSIS — C50411 Malignant neoplasm of upper-outer quadrant of right female breast: Secondary | ICD-10-CM | POA: Insufficient documentation

## 2021-05-26 DIAGNOSIS — Z170 Estrogen receptor positive status [ER+]: Secondary | ICD-10-CM | POA: Insufficient documentation

## 2021-05-26 DIAGNOSIS — Z483 Aftercare following surgery for neoplasm: Secondary | ICD-10-CM | POA: Insufficient documentation

## 2021-05-26 NOTE — Patient Instructions (Addendum)
     [facility]  [address]  [place] [address]  [phone]  After Breast Cancer Class It is recommended you attend the ABC class to be educated on lymphedema risk reduction. This class is free of charge and lasts for 1 hour. It is a 1-time class. You don't want to do it so I'll give you th written information.  Scar massage You can begin scar massage a few minutes each day using vitamin E oil that you have at home.  Compression garment You should still wear your sports bra for a few more weeks.  Follow up PT: It is recommended you return every 3 months for the first 3 years following surgery to be assessed on the SOZO machine for an L-Dex score. This helps prevent clinically significant lymphedema in 95% of patients. These follow up screens are 10 minute appointments that you are not billed for. You are scheduled for  October 24, 2021 at 10:00.

## 2021-05-26 NOTE — Therapy (Signed)
[facility] [address] [place], [place], [address] Phone: [phone]   Fax:  [phone]  Physical Therapy Treatment  Patient Details  Name: [name] MRN: [id number] Date of Birth: September 16, 1933 Referring Provider (PT): Dr. Erroll Luna   Encounter Date: 05/26/2021   PT End of Session - 05/26/21 1147     Visit Number 2    Number of Visits 2    PT Start Time 1103    PT Stop Time 1147    PT Time Calculation (min) 44 min    Activity Tolerance Patient tolerated treatment well    Behavior During Therapy [facility] for tasks assessed/performed             Past Medical History:  Diagnosis Date   Abnormal results of liver function studies 05/15/2007   Allergy    Anal fissure 09/26/2007   Formatting of this note might be different from the original. Qualifier: Diagnosis of  By: Nils Pyle CMA (AAMA), Leisha   Anxiety    B12 deficiency 05/05/2020   Chicken pox    Essential tremor    of head, sometimes hands.  pt takes primidone to help the tremors-DR. MILLER-NEUROLOGIST IN [place].  PT WAS STARTED ON VALUIM MANY YEARS AGO FOR HER TREMORS--AND CONTINUES TO TAKE.   Fracture of humerus, proximal, left, closed 10/28/2011   pt has finished physical therapy-limited ROM reaching to her back and unable to lift heavy things with left hand/arm   GERD (gastroesophageal reflux disease)    H/O hiatal hernia    Hereditary and idiopathic peripheral neuropathy 03/27/2014   Hx of colonic polyps    Osteoarthritis    PAIN AND OA BILATERAL KNEES; ALSO HAS ARTHRITIS IN HANDS AND BACK BUT NO BACK PAIN   Other chronic otitis externa 03/01/2009   Formatting of this note might be different from the original. Qualifier: Diagnosis of  By: Arnoldo Morale MD, Balinda Quails    Past Surgical History:  Procedure Laterality Date   arthroscopy left knee     arthroscopy right knee     bmp  07/17/2000   BREAST BIOPSY Right 03/23/2021   x2   BREAST LUMPECTOMY  WITH RADIOACTIVE SEED AND SENTINEL LYMPH NODE BIOPSY Right 04/20/2021   Procedure: RIGHT BREAST LUMPECTOMY WITH RADIOACTIVE SEED AND SENTINEL LYMPH NODE BIOPSY;  Surgeon: Erroll Luna, MD;  Location: [facility];  Service: General;  Laterality: Right;   CATARACT EXTRACTION, BILATERAL     COLONOSCOPY W/ POLYPECTOMY     DILATION AND CURETTAGE OF UTERUS     RIGHT FOOT SURGERIES X 2     TOTAL KNEE ARTHROPLASTY  03/13/2012   Procedure: TOTAL KNEE ARTHROPLASTY;  Surgeon: Gearlean Alf, MD;  [facility];  Service: Orthopedics;  Laterality: Left;  steroid injection right knee   TOTAL KNEE ARTHROPLASTY Right 08/04/2013   Procedure: RIGHT TOTAL KNEE ARTHROPLASTY;  Surgeon: Gearlean Alf, MD;  [facility];  Service: Orthopedics;  Laterality: Right;    There were no vitals filed for this visit.   Subjective Assessment - 05/26/21 1107     Subjective Patient reports she underwent a right lumpectomy and sentinel node biopsy (5 negative nodes) on 04/20/2021. She is not undergoing any further treatment.    Pertinent History Patient was diagnosed on 02/28/2021 with right grade II invasive ductal carcinoma breast cancer. She underwent a right lumpectomy and sentinel node biopsy (5 negative nodes) on 04/20/2021. It is ER/PR positive and HER2 negative with  a Ki67 of 1%. She had bilateral knee replacements 10 years ago.    Patient Stated Goals See if my arm is ok    Currently in Pain? Yes    Pain Score 5     Pain Location Breast    Pain Orientation Right    Pain Descriptors / Indicators Other (Comment);Tender   bee stings   Pain Type Surgical pain    Pain Onset 1 to 4 weeks ago    Pain Frequency Intermittent    Aggravating Factors  Wearing bra    Pain Relieving Factors Unknown                [facility] PT Assessment - 05/26/21 0001       Assessment   Medical Diagnosis s/p right lumpectomy and SLNB    Referring Provider (PT) Dr. Marcello Moores [place]    Onset Date/Surgical Date  04/20/21    Hand Dominance Right    Prior Therapy Baselines      Precautions   Precautions Other (comment)    Precaution Comments recent surery and right arm lymphedema risk      Restrictions   Weight Bearing Restrictions No      Balance Screen   Has the patient fallen in the past 6 months No    Has the patient had a decrease in activity level because of a fear of falling?  No    Is the patient reluctant to leave their home because of a fear of falling?  No      Home Environment   Living Environment Private residence    Living Arrangements Alone    Available Help at Discharge Family      Prior Function   Level of [place] Retired    Leisure She does not exercise      Cognition   Overall Cognitive Status Within Functional Limits for tasks assessed      Observation/Other Assessments   Observations Right breast with red itchy rash present (see photo in media tab). Moderate scar tissue tightness but incisions have healed very well. no significant edema present.      Posture/Postural Control   Posture/Postural Control Postural limitations    Postural Limitations Rounded Shoulders;Forward head;Increased thoracic kyphosis      ROM / Strength   AROM / PROM / Strength AROM      AROM   AROM Assessment Site Shoulder    Right/Left Shoulder Right    Right Shoulder Extension 43 Degrees    Right Shoulder Flexion 142 Degrees    Right Shoulder ABduction 156 Degrees    Right Shoulder Internal Rotation 81 Degrees    Right Shoulder External Rotation 85 Degrees      Strength   Overall Strength Within functional limits for tasks performed               LYMPHEDEMA/ONCOLOGY QUESTIONNAIRE - 05/26/21 0001       Type   Cancer Type Right breast cancer      Surgeries   Lumpectomy Date 04/20/21    Sentinel Lymph Node Biopsy Date 04/20/21    Number Lymph Nodes Removed 2      Treatment   Active Chemotherapy Treatment No    Past Chemotherapy Treatment  No    Active Radiation Treatment No    Past Radiation Treatment No    Current Hormone Treatment No    Past Hormone Therapy No      What other symptoms do you have  Are you Having Heaviness or Tightness Yes    Are you having Pain Yes    Are you having pitting edema No    Is it Hard or Difficult finding clothes that fit No    Do you have infections No    Is there Decreased scar mobility Yes    [place] Sign No      Lymphedema Assessments   Lymphedema Assessments Upper extremities      Right Upper Extremity Lymphedema   10 cm Proximal to Olecranon Process 27.4 cm    Olecranon Process 25 cm    10 cm Proximal to Ulnar Styloid Process 21.8 cm    Just Proximal to Ulnar Styloid Process 15.6 cm    Across Hand at [employer] 18.7 cm    At [place] of 2nd Digit 6 cm      Left Upper Extremity Lymphedema   10 cm Proximal to Olecranon Process 30 cm    Olecranon Process 25.8 cm    10 cm Proximal to Ulnar Styloid Process 20.9 cm    Just Proximal to Ulnar Styloid Process 15.2 cm    Across Hand at [employer] 17.7 cm    At [place] of 2nd Digit 5.7 cm                Quick Dash - 05/26/21 0001     Open a tight or new jar Moderate difficulty    Do heavy household chores (wash walls, wash floors) Moderate difficulty    Carry a shopping bag or briefcase No difficulty    Wash your back Mild difficulty    Use a knife to cut food No difficulty    Recreational activities in which you take some force or impact through your arm, shoulder, or hand (golf, hammering, tennis) Unable    During the past week, to what extent has your arm, shoulder or hand problem interfered with your normal social activities with family, friends, neighbors, or groups? Modererately    During the past week, to what extent has your arm, shoulder or hand problem limited your work or other regular daily activities Not at all    Arm, shoulder, or hand pain. Moderate    Tingling (pins and needles) in your arm, shoulder,  or hand Moderate    Difficulty Sleeping No difficulty    DASH Score 34.09 %                             PT Education - 05/26/21 1147     Education Details Lymphedema risk and post op HEP    Person(s) Educated Patient    Methods Explanation;Demonstration;Handout    Comprehension Returned demonstration;Verbalized understanding                 PT Long Term Goals - 05/26/21 1233       PT LONG TERM GOAL #1   Title Patient will demonstrate she has regained full shoulder ROM and function post operatively compared to baselines.    Time 8    Period Weeks    Status Achieved                   Plan - 05/26/21 1148     Clinical Impression Statement Patient is doing well s/p right lumpectomy and sentinel node biopsy (2 negative nodes) on 04/20/2021. She has declined radiation and anti-estrogen therapy. She has regained shoulder ROM and function, has no significant  edema, and her incisions are healing well. She requested to have SOZO screen every 6 months instead of every 3 months due to the distance she drives and her low risk of lymphedema so PT agreed to that. Otherise she has no PT needs at this time. Her MD was sent a message regarding her right breast rash.    PT Treatment/Interventions ADLs/Self Care Home Management;Therapeutic exercise;Patient/family education    PT Next Visit Plan D/C    Consulted and Agree with Plan of Care Patient             Patient will benefit from skilled therapeutic intervention in order to improve the following deficits and impairments:  Postural dysfunction, Decreased range of motion, Decreased knowledge of precautions, Impaired UE functional use, Pain  Visit Diagnosis: Malignant neoplasm of upper-outer quadrant of right breast in female, estrogen receptor positive ([place])  Abnormal posture  Aftercare following surgery for neoplasm     Problem List Patient Active Problem List   Diagnosis Date Noted   Adrenal  nodule- likely benign adenoma ([place]) 04/12/2021   Aortic atherosclerosis ([place]) 04/12/2021   Malignant neoplasm of upper-outer quadrant of right breast in female, estrogen receptor positive ([place]) 03/28/2021   Hyperlipidemia 03/18/2021   Invasive ductal carcinoma of breast, female, right ([place]) 02/28/2021   Vitamin D deficiency 08/05/2019   Elevated blood pressure reading without diagnosis of hypertension 04/30/2019   Allergic rhinitis 10/28/2018   OSA (obstructive sleep apnea) 11/28/2017   Iron overload 02/21/2016   Senile nuclear sclerosis 12/10/2014   Gait difficulty 03/27/2014   Low bone mass 10/31/2012   Gastro-esophageal reflux disease without esophagitis 06/25/2012   Fecal incontinence 06/25/2012   Thoracic radiculitis 07/20/2010   Diverticulosis of colon 09/26/2007   Depression with anxiety 09/26/2007   Enlarged lymph nodes 07/26/2007   History of colonic polyps 05/15/2007   Irritable bowel syndrome 05/15/2007   Tremor, essential 02/11/2007   Osteoarthritis of both hands 02/11/2007   PHYSICAL THERAPY DISCHARGE SUMMARY  Visits from Start of Care: 2  Current functional level related to goals / functional outcomes: Goals met. See above for objective measurements.   Remaining deficits: Rash on right breast; MD notified   Education / Equipment: Lymphedema education  Patient agrees to discharge. Patient goals were met. Patient is being discharged due to meeting the stated rehab goals.  Annia Friendly, [place] 05/26/21 12:48 PM    [facility] [address] [place], [place], [address] Phone: [phone]   Fax:  [phone]  Name: [name] MRN: [id number] Date of Birth: 01-Feb-1934

## 2021-06-06 ENCOUNTER — Telehealth: Payer: Self-pay | Admitting: Family Medicine

## 2021-06-06 NOTE — Telephone Encounter (Signed)
Pt had surgery last week and has been itching with a rash ever since. Surgeon told her to take benedryl but it only making her tired, not helping the itch. Pt would like Langley Gauss or Dr Gena Fray to call her today.

## 2021-06-06 NOTE — Telephone Encounter (Signed)
Patient states that she brook out in a rash around the incission area.  She was advised by surgeon to take Benadryl and it has helped although she is itching from her waist up.  Is there anything else she can do? Please review and advise.  Thanks.  Dm/cma

## 2021-06-07 ENCOUNTER — Telehealth: Payer: Self-pay | Admitting: Family Medicine

## 2021-06-07 DIAGNOSIS — L299 Pruritus, unspecified: Secondary | ICD-10-CM

## 2021-06-08 MED ORDER — HYDROXYZINE PAMOATE 25 MG PO CAPS: 25.0000 mg | ORAL_CAPSULE | Freq: Three times a day (TID) | ORAL | 0 refills | Status: DC | PRN

## 2021-06-08 NOTE — Telephone Encounter (Signed)
Patient notified VIA phone. No further questions. Dm/cma  

## 2021-06-14 DIAGNOSIS — H26493 Other secondary cataract, bilateral: Secondary | ICD-10-CM | POA: Diagnosis not present

## 2021-06-14 DIAGNOSIS — H5203 Hypermetropia, bilateral: Secondary | ICD-10-CM | POA: Diagnosis not present

## 2021-06-14 DIAGNOSIS — H52203 Unspecified astigmatism, bilateral: Secondary | ICD-10-CM | POA: Diagnosis not present

## 2021-06-14 DIAGNOSIS — H04123 Dry eye syndrome of bilateral lacrimal glands: Secondary | ICD-10-CM | POA: Diagnosis not present

## 2021-06-14 DIAGNOSIS — H02834 Dermatochalasis of left upper eyelid: Secondary | ICD-10-CM | POA: Diagnosis not present

## 2021-06-14 DIAGNOSIS — H524 Presbyopia: Secondary | ICD-10-CM | POA: Diagnosis not present

## 2021-06-14 DIAGNOSIS — H02831 Dermatochalasis of right upper eyelid: Secondary | ICD-10-CM | POA: Diagnosis not present

## 2021-06-14 DIAGNOSIS — Z961 Presence of intraocular lens: Secondary | ICD-10-CM | POA: Diagnosis not present

## 2021-06-14 DIAGNOSIS — D23122 Other benign neoplasm of skin of left lower eyelid, including canthus: Secondary | ICD-10-CM | POA: Diagnosis not present

## 2021-06-24 ENCOUNTER — Ambulatory Visit (INDEPENDENT_AMBULATORY_CARE_PROVIDER_SITE_OTHER): Payer: Medicare Other | Admitting: Family Medicine

## 2021-06-24 ENCOUNTER — Other Ambulatory Visit: Payer: Self-pay

## 2021-06-24 VITALS — BP 132/80 | HR 65 | Temp 97.3°F | Ht 64.0 in | Wt 169.4 lb

## 2021-06-24 DIAGNOSIS — L239 Allergic contact dermatitis, unspecified cause: Secondary | ICD-10-CM

## 2021-06-24 MED ORDER — METHYLPREDNISOLONE 4 MG PO TBPK: ORAL_TABLET | ORAL | 0 refills | Status: DC

## 2021-06-24 MED ORDER — HYDROXYZINE PAMOATE 25 MG PO CAPS: 25.0000 mg | ORAL_CAPSULE | Freq: Three times a day (TID) | ORAL | 0 refills | Status: DC | PRN

## 2021-06-24 NOTE — Progress Notes (Signed)
[facility] [address] [place] [address] Dept: [phone] Dept Fax: [phone]  Office Visit  Subjective:    Patient ID: [name], female    DOB: [birth year], 87 y.o..   MRN: [id number]  Chief Complaint  Patient presents with   Acute Visit    C/o having all over itching since having breast surgery.  She has taken Hydroxyzine and a course of Prednisone with no relief.  Also used Benadryl.     History of Present Illness:  Patient is in today for evaluation of an ongoing rash. [name] had a rash develop soon after she had breast surgery in mid October. It initially developed around her incision sites. Subsequently, this spread to the chest and back. The rash has been quite pruritic. She had been using topical cortisone cream without relief. I had prescribed some Vistaril for itching. Her surgeon had placed her on a 10-day course of prednisone. She has seen some mild clearing, but the rash remains quite itchy yet. The supposition was that she may have reacted to one of the skin cleansers/preps during her surgery. She has foundt hat getting in a hot shower provides some relief.  Past Medical History: Patient Active Problem List   Diagnosis Date Noted   Adrenal nodule- likely benign adenoma ([place]) 04/12/2021   Aortic atherosclerosis ([place]) 04/12/2021   Malignant neoplasm of upper-outer quadrant of right breast in female, estrogen receptor positive ([place]) 03/28/2021   Hyperlipidemia 03/18/2021   Invasive ductal carcinoma of breast, female, right ([place]) 02/28/2021   Vitamin D deficiency 08/05/2019   Elevated blood pressure reading without diagnosis of hypertension 04/30/2019   Allergic rhinitis 10/28/2018   OSA (obstructive sleep apnea) 11/28/2017   Iron overload 02/21/2016   Senile nuclear sclerosis 12/10/2014   Gait difficulty 03/27/2014   Low bone mass 10/31/2012   Gastro-esophageal reflux disease without  esophagitis 06/25/2012   Fecal incontinence 06/25/2012   Thoracic radiculitis 07/20/2010   Diverticulosis of colon 09/26/2007   Depression with anxiety 09/26/2007   Enlarged lymph nodes 07/26/2007   History of colonic polyps 05/15/2007   Irritable bowel syndrome 05/15/2007   Tremor, essential 02/11/2007   Osteoarthritis of both hands 02/11/2007   Past Surgical History:  Procedure Laterality Date   arthroscopy left knee     arthroscopy right knee     bmp  07/17/2000   BREAST BIOPSY Right 03/23/2021   x2   BREAST LUMPECTOMY WITH RADIOACTIVE SEED AND SENTINEL LYMPH NODE BIOPSY Right 04/20/2021   Procedure: RIGHT BREAST LUMPECTOMY WITH RADIOACTIVE SEED AND SENTINEL LYMPH NODE BIOPSY;  Surgeon: Erroll Luna, MD;  Location: [facility];  Service: General;  Laterality: Right;   CATARACT EXTRACTION, BILATERAL     COLONOSCOPY W/ POLYPECTOMY     DILATION AND CURETTAGE OF UTERUS     RIGHT FOOT SURGERIES X 2     TOTAL KNEE ARTHROPLASTY  03/13/2012   Procedure: TOTAL KNEE ARTHROPLASTY;  Surgeon: Gearlean Alf, MD;  Location: [facility];  Service: Orthopedics;  Laterality: Left;  steroid injection right knee   TOTAL KNEE ARTHROPLASTY Right 08/04/2013   Procedure: RIGHT TOTAL KNEE ARTHROPLASTY;  Surgeon: Gearlean Alf, MD;  Location: [facility];  Service: Orthopedics;  Laterality: Right;   Family History  Problem Relation Age of Onset   Stroke Mother    Kidney disease Father    Cancer Maternal Grandmother        Leukemia   Kidney failure Other  renal disease   Outpatient Medications Prior to Visit  Medication Sig Dispense Refill   diazepam (VALIUM) 5 MG tablet Take 1 tablet (5 mg total) by mouth every 8 (eight) hours as needed for anxiety. 90 tablet 1   hyoscyamine (LEVSIN) 0.125 MG tablet Take 1 tablet (0.125 mg total) by mouth every 4 (four) hours as needed for cramping. 60 tablet 1   ibuprofen (ADVIL) 800 MG tablet Take 1 tablet (800 mg total) by mouth every 8 (eight)  hours as needed. 30 tablet 0   omeprazole (PRILOSEC) 20 MG capsule Take 1 capsule (20 mg total) by mouth daily. 90 capsule 3   PRIMIDONE PO Take by mouth.     Vitamin D, Ergocalciferol, (DRISDOL) 1.25 MG (50000 UNIT) CAPS capsule Take 1 capsule (50,000 Units total) by mouth every 7 (seven) days. 5 capsule 3   HYDROcodone-acetaminophen (NORCO/VICODIN) 5-325 MG tablet Take 1 tablet by mouth every 6 (six) hours as needed for moderate pain. (Patient not taking: Reported on 06/24/2021) 15 tablet 0   hydrOXYzine (VISTARIL) 25 MG capsule Take 1 capsule (25 mg total) by mouth every 8 (eight) hours as needed. 30 capsule 0   No facility-administered medications prior to visit.   Allergies  Allergen Reactions   Memantine Swelling   Propranolol Other (See Comments)    Made her head feel very funny Made her head feel very funny    Codeine Nausea And Vomiting   Sulfamethoxazole Rash    REACTION: unspecified    Objective:   Today's Vitals   06/24/21 1524  BP: 132/80  Pulse: 65  Temp: (!) 97.3 F (36.3 C)  TempSrc: Temporal  SpO2: 98%  Weight: 169 lb 6.4 oz (76.8 kg)  Height: 5\' 4"  (1.626 m)   Body mass index is 29.08 kg/m.   General: Well developed, well nourished. No acute distress. Skin: Warm and dry. There are multiple small, oval, maculopapular lesions with erythema and central excoriations on the chest and back diffusely. This spares the extremities. Psych: Alert and oriented. Normal mood and affect.  Health Maintenance Due  Topic Date Due   Zoster Vaccines- Shingrix (1 of 2) Never done   TETANUS/TDAP  07/17/2002   COVID-19 Vaccine (3 - Pfizer risk series) 11/24/2019     Assessment & Plan:   1. Allergic dermatitis [name] has a diffuse rash that may be an allergic reaction to a topical surgical prep. I will try an additional course of steroids to see if this will resolve. I will renew her hydroxyzine (H1 blocker). I recommend she take this together with an H2 blocker  (Pepcid) to see if this manages the itching better. She should also use a lotion, such as Aveeno to soothe the skin. If not improved in 10 days, she shodul reach back to me and I will refer her to dermatology.  - methylPREDNISolone (MEDROL DOSEPAK) 4 MG TBPK tablet; Follow standard dosing instructions  Dispense: 21 tablet; Refill: 0 - hydrOXYzine (VISTARIL) 25 MG capsule; Take 1 capsule (25 mg total) by mouth every 8 (eight) hours as needed.  Dispense: 30 capsule; Refill: 0  [name], MD

## 2021-07-19 ENCOUNTER — Ambulatory Visit (INDEPENDENT_AMBULATORY_CARE_PROVIDER_SITE_OTHER): Payer: Medicare Other

## 2021-07-19 DIAGNOSIS — Z0000 Encounter for general adult medical examination without abnormal findings: Secondary | ICD-10-CM | POA: Diagnosis not present

## 2021-07-19 NOTE — Patient Instructions (Signed)
[name] , Thank you for taking time to come for your Medicare Wellness Visit. I appreciate your ongoing commitment to your health goals. Please review the following plan we discussed and let me know if I can assist you in the future.   Screening recommendations/referrals: Colonoscopy: no longer required  Mammogram: recovering lumpectomy  Bone Density: 10/17/2021 Recommended yearly ophthalmology/optometry visit for glaucoma screening and checkup Recommended yearly dental visit for hygiene and checkup  Vaccinations: Influenza vaccine: completed  Pneumococcal vaccine: completed  Tdap vaccine: due  Shingles vaccine: will consider     Advanced directives: will provide copies   Conditions/risks identified: none   Next appointment: none    Preventive Care 86 Years and Older, Female Preventive care refers to lifestyle choices and visits with your health care provider that can promote health and wellness. What does preventive care include? A yearly physical exam. This is also called an annual well check. Dental exams once or twice a year. Routine eye exams. Ask your health care provider how often you should have your eyes checked. Personal lifestyle choices, including: Daily care of your teeth and gums. Regular physical activity. Eating a healthy diet. Avoiding tobacco and drug use. Limiting alcohol use. Practicing safe sex. Taking low-dose aspirin every day. Taking vitamin and mineral supplements as recommended by your health care provider. What happens during an annual well check? The services and screenings done by your health care provider during your annual well check will depend on your age, overall health, lifestyle risk factors, and family history of disease. Counseling  Your health care provider may ask you questions about your: Alcohol use. Tobacco use. Drug use. Emotional well-being. Home and relationship well-being. Sexual activity. Eating habits. History of  falls. Memory and ability to understand (cognition). Work and work Statistician. Reproductive health. Screening  You may have the following tests or measurements: Height, weight, and BMI. Blood pressure. Lipid and cholesterol levels. These may be checked every 5 years, or more frequently if you are over 45 years old. Skin check. Lung cancer screening. You may have this screening every year starting at age 86 if you have a 30-pack-year history of smoking and currently smoke or have quit within the past 15 years. Fecal occult blood test (FOBT) of the stool. You may have this test every year starting at age 86. Flexible sigmoidoscopy or colonoscopy. You may have a sigmoidoscopy every 5 years or a colonoscopy every 10 years starting at age 86. Hepatitis C blood test. Hepatitis B blood test. Sexually transmitted disease (STD) testing. Diabetes screening. This is done by checking your blood sugar (glucose) after you have not eaten for a while (fasting). You may have this done every 1-3 years. Bone density scan. This is done to screen for osteoporosis. You may have this done starting at age 86. Mammogram. This may be done every 1-2 years. Talk to your health care provider about how often you should have regular mammograms. Talk with your health care provider about your test results, treatment options, and if necessary, the need for more tests. Vaccines  Your health care provider may recommend certain vaccines, such as: Influenza vaccine. This is recommended every year. Tetanus, diphtheria, and acellular pertussis (Tdap, Td) vaccine. You may need a Td booster every 10 years. Zoster vaccine. You may need this after age 86. Pneumococcal 13-valent conjugate (PCV13) vaccine. One dose is recommended after age 86. Pneumococcal polysaccharide (PPSV23) vaccine. One dose is recommended after age 86. Talk to your health care provider about which  screenings and vaccines you need and how often you need  them. This information is not intended to replace advice given to you by your health care provider. Make sure you discuss any questions you have with your health care provider. Document Released: 07/30/2015 Document Revised: 03/22/2016 Document Reviewed: 05/04/2015 Elsevier Interactive Patient Education  2017 [place] Prevention in the Home Falls can cause injuries. They can happen to people of all ages. There are many things you can do to make your home safe and to help prevent falls. What can I do on the outside of my home? Regularly fix the edges of walkways and driveways and fix any cracks. Remove anything that might make you trip as you walk through a door, such as a raised step or threshold. Trim any bushes or trees on the path to your home. Use bright outdoor lighting. Clear any walking paths of anything that might make someone trip, such as rocks or tools. Regularly check to see if handrails are loose or broken. Make sure that both sides of any steps have handrails. Any raised decks and porches should have guardrails on the edges. Have any leaves, snow, or ice cleared regularly. Use sand or salt on walking paths during winter. Clean up any spills in your garage right away. This includes oil or grease spills. What can I do in the bathroom? Use night lights. Install grab bars by the toilet and in the tub and shower. Do not use towel bars as grab bars. Use non-skid mats or decals in the tub or shower. If you need to sit down in the shower, use a plastic, non-slip stool. Keep the floor dry. Clean up any water that spills on the floor as soon as it happens. Remove soap buildup in the tub or shower regularly. Attach bath mats securely with double-sided non-slip rug tape. Do not have throw rugs and other things on the floor that can make you trip. What can I do in the bedroom? Use night lights. Make sure that you have a light by your bed that is easy to reach. Do not use  any sheets or blankets that are too big for your bed. They should not hang down onto the floor. Have a firm chair that has side arms. You can use this for support while you get dressed. Do not have throw rugs and other things on the floor that can make you trip. What can I do in the kitchen? Clean up any spills right away. Avoid walking on wet floors. Keep items that you use a lot in easy-to-reach places. If you need to reach something above you, use a strong step stool that has a grab bar. Keep electrical cords out of the way. Do not use floor polish or wax that makes floors slippery. If you must use wax, use non-skid floor wax. Do not have throw rugs and other things on the floor that can make you trip. What can I do with my stairs? Do not leave any items on the stairs. Make sure that there are handrails on both sides of the stairs and use them. Fix handrails that are broken or loose. Make sure that handrails are as long as the stairways. Check any carpeting to make sure that it is firmly attached to the stairs. Fix any carpet that is loose or worn. Avoid having throw rugs at the top or bottom of the stairs. If you do have throw rugs, attach them to the floor with carpet  tape. Make sure that you have a light switch at the top of the stairs and the bottom of the stairs. If you do not have them, ask someone to add them for you. What else can I do to help prevent falls? Wear shoes that: Do not have high heels. Have rubber bottoms. Are comfortable and fit you well. Are closed at the toe. Do not wear sandals. If you use a stepladder: Make sure that it is fully opened. Do not climb a closed stepladder. Make sure that both sides of the stepladder are locked into place. Ask someone to hold it for you, if possible. Clearly mark and make sure that you can see: Any grab bars or handrails. First and last steps. Where the edge of each step is. Use tools that help you move around (mobility aids)  if they are needed. These include: Canes. Walkers. Scooters. Crutches. Turn on the lights when you go into a dark area. Replace any light bulbs as soon as they burn out. Set up your furniture so you have a clear path. Avoid moving your furniture around. If any of your floors are uneven, fix them. If there are any pets around you, be aware of where they are. Review your medicines with your doctor. Some medicines can make you feel dizzy. This can increase your chance of falling. Ask your doctor what other things that you can do to help prevent falls. This information is not intended to replace advice given to you by your health care provider. Make sure you discuss any questions you have with your health care provider. Document Released: 04/29/2009 Document Revised: 12/09/2015 Document Reviewed: 08/07/2014 Elsevier Interactive Patient Education  2017 [employer].

## 2021-07-19 NOTE — Progress Notes (Signed)
Subjective:   Dameisha Tschida is a 88 y.o. female who presents for Medicare Annual (Subsequent) preventive examination.  I connected with Mylin Gignac today by telephone and verified that I am speaking with the correct person using two identifiers. Location patient: home Location provider: work Persons participating in the virtual visit: patient, provider.   I discussed the limitations, risks, security and privacy concerns of performing an evaluation and management service by telephone and the availability of in person appointments. I also discussed with the patient that there may be a patient responsible charge related to this service. The patient expressed understanding and verbally consented to this telephonic visit.    Interactive audio and video telecommunications were attempted between this provider and patient, however failed, due to patient having technical difficulties OR patient did not have access to video capability.  We continued and completed visit with audio only.    Review of Systems     Cardiac Risk Factors include: advanced age (>62men, >30 women)     Objective:    Today's Vitals   There is no height or weight on file to calculate BMI.  Advanced Directives 07/19/2021 04/20/2021 04/13/2021 03/30/2021 04/20/2020 08/04/2013 08/04/2013  Does Patient Have a Medical Advance Directive? Yes Yes Yes Yes Yes Patient has advance directive, copy in chart Patient has advance directive, copy in chart  Type of Advance Directive [place];Living will - - Healthcare Power of [employer] will [place];Living will -  Does patient want to make changes to medical advance directive? - No - Patient declined No - Patient declined No - Patient declined - No -  Copy of Healthcare Power of Attorney in Chart? No - copy requested - No - copy requested No - copy requested - - -  Would patient like information on creating a medical advance directive? - No -  Patient declined - - - - -  Pre-existing out of facility DNR order (yellow form or pink MOST form) - - - - - No -    Current Medications (verified) Outpatient Encounter Medications as of 07/19/2021  Medication Sig   diazepam (VALIUM) 5 MG tablet Take 1 tablet (5 mg total) by mouth every 8 (eight) hours as needed for anxiety.   hydrOXYzine (VISTARIL) 25 MG capsule Take 1 capsule (25 mg total) by mouth every 8 (eight) hours as needed.   hyoscyamine (LEVSIN) 0.125 MG tablet Take 1 tablet (0.125 mg total) by mouth every 4 (four) hours as needed for cramping.   ibuprofen (ADVIL) 800 MG tablet Take 1 tablet (800 mg total) by mouth every 8 (eight) hours as needed.   methylPREDNISolone (MEDROL DOSEPAK) 4 MG TBPK tablet Follow standard dosing instructions   omeprazole (PRILOSEC) 20 MG capsule Take 1 capsule (20 mg total) by mouth daily.   PRIMIDONE PO Take by mouth.   Vitamin D, Ergocalciferol, (DRISDOL) 1.25 MG (50000 UNIT) CAPS capsule Take 1 capsule (50,000 Units total) by mouth every 7 (seven) days.   HYDROcodone-acetaminophen (NORCO/VICODIN) 5-325 MG tablet Take 1 tablet by mouth every 6 (six) hours as needed for moderate pain. (Patient not taking: Reported on 07/19/2021)   No facility-administered encounter medications on file as of 07/19/2021.    Allergies (verified) Memantine, Propranolol, Codeine, and Sulfamethoxazole   History: Past Medical History:  Diagnosis Date   Abnormal results of liver function studies 05/15/2007   Allergy    Anal fissure 09/26/2007   Formatting of this note might be different from the original. Qualifier:  Diagnosis of  By: Nils Pyle CMA (AAMA), Leisha   Anxiety    B12 deficiency 05/05/2020   Chicken pox    Essential tremor    of head, sometimes hands.  pt takes primidone to help the tremors-DR. MILLER-NEUROLOGIST IN [place].  PT WAS STARTED ON VALUIM MANY YEARS AGO FOR HER TREMORS--AND CONTINUES TO TAKE.   Fracture of humerus, proximal, left, closed 10/28/2011    pt has finished physical therapy-limited ROM reaching to her back and unable to lift heavy things with left hand/arm   GERD (gastroesophageal reflux disease)    H/O hiatal hernia    Hereditary and idiopathic peripheral neuropathy 03/27/2014   Hx of colonic polyps    Osteoarthritis    PAIN AND OA BILATERAL KNEES; ALSO HAS ARTHRITIS IN HANDS AND BACK BUT NO BACK PAIN   Other chronic otitis externa 03/01/2009   Formatting of this note might be different from the original. Qualifier: Diagnosis of  By: Arnoldo Morale MD, Balinda Quails   Past Surgical History:  Procedure Laterality Date   arthroscopy left knee     arthroscopy right knee     bmp  07/17/2000   BREAST BIOPSY Right 03/23/2021   x2   BREAST LUMPECTOMY WITH RADIOACTIVE SEED AND SENTINEL LYMPH NODE BIOPSY Right 04/20/2021   Procedure: RIGHT BREAST LUMPECTOMY WITH RADIOACTIVE SEED AND SENTINEL LYMPH NODE BIOPSY;  Surgeon: Erroll Luna, MD;  Location: [facility];  Service: General;  Laterality: Right;   CATARACT EXTRACTION, BILATERAL     COLONOSCOPY W/ POLYPECTOMY     DILATION AND CURETTAGE OF UTERUS     RIGHT FOOT SURGERIES X 2     TOTAL KNEE ARTHROPLASTY  03/13/2012   Procedure: TOTAL KNEE ARTHROPLASTY;  Surgeon: Gearlean Alf, MD;  Location: [facility];  Service: Orthopedics;  Laterality: Left;  steroid injection right knee   TOTAL KNEE ARTHROPLASTY Right 08/04/2013   Procedure: RIGHT TOTAL KNEE ARTHROPLASTY;  Surgeon: Gearlean Alf, MD;  Location: [facility];  Service: Orthopedics;  Laterality: Right;   Family History  Problem Relation Age of Onset   Stroke Mother    Kidney disease Father    Cancer Maternal Grandmother        Leukemia   Kidney failure Other        renal disease   Social History   Socioeconomic History   Marital status: Widowed    Spouse name: Not on file   Number of children: 3   Years of education: Not on file   Highest education level: Not on file  Occupational History   Not on file  Tobacco  Use   Smoking status: Never   Smokeless tobacco: Never  Substance and Sexual Activity   Alcohol use: Yes    Comment: Rare   Drug use: No   Sexual activity: Not Currently  Other Topics Concern   Not on file  Social History Narrative   Not on file   Social Determinants of Health   Financial Resource Strain: Low Risk    Difficulty of Paying Living Expenses: Not hard at all  Food Insecurity: No Food Insecurity   Worried About Running Out of Food in the Last Year: Never true   [place] in the Last Year: Never true  Transportation Needs: No Transportation Needs   Lack of Transportation (Medical): No   Lack of Transportation (Non-Medical): No  Physical Activity: Inactive   Days of Exercise per Week: 0 days   Minutes of Exercise per  Session: 0 min  Stress: Not on file  Social Connections: Moderately Isolated   Frequency of Communication with Friends and Family: Twice a week   Frequency of Social Gatherings with Friends and Family: Twice a week   Attends Religious Services: 1 to 4 times per year   Active Member of [employer] or Organizations: No   Attends Archivist Meetings: Never   Marital Status: Widowed    Tobacco Counseling Counseling given: Not Answered   Clinical Intake:  Pre-visit preparation completed: Yes  Pain : No/denies pain     Nutritional Risks: None Diabetes: No  How often do you need to have someone help you when you read instructions, pamphlets, or other written materials from your doctor or pharmacy?: 1 - Never What is the last grade level you completed in school?: college  Diabetic?no   Interpreter Needed?: No  Information entered by :: [facility] of Daily Living In your present state of health, do you have any difficulty performing the following activities: 07/19/2021 04/20/2021  Hearing? N N  Vision? N N  Difficulty concentrating or making decisions? N N  Walking or climbing stairs? N N  Dressing or bathing? N N   Doing errands, shopping? N -  Preparing Food and eating ? N -  Using the Toilet? N -  In the past six months, have you accidently leaked urine? N -  Do you have problems with loss of bowel control? N -  Managing your Medications? N -  Managing your Finances? N -  Some recent data might be hidden    Patient Care Team: [name], MD as PCP - General (Family Medicine) Janace Litten, MD as Referring Physician (Neurology) Roel Cluck, MD as Referring Physician (Ophthalmology) Sabra Heck Precious Haws, MD as Referring Physician (Neurology) Rozetta Nunnery, MD (Inactive) as Consulting Physician (Otolaryngology) Erroll Luna, MD as Consulting Physician (General Surgery) Truitt Merle, MD as Consulting Physician (Hematology) Gery Pray, MD as Consulting Physician (Radiation Oncology) Mauro Kaufmann, RN as Oncology Nurse Navigator Rockwell Germany, RN as Oncology Nurse Navigator  Indicate any recent Medical Services you may have received from other than [facility] providers in the past year (date may be approximate).     Assessment:   This is a routine wellness examination for Karlee.  Hearing/Vision screen Vision Screening - Comments:: Annual eye exams wears glasses   Dietary issues and exercise activities discussed: Current Exercise Habits: The patient does not participate in regular exercise at present, Exercise limited by: Other - see comments (rocovering from surgery)   Goals Addressed   None    Depression Screen PHQ 2/9 Scores 07/19/2021 07/19/2021 05/12/2021 04/20/2020 11/13/2017  PHQ - 2 Score 0 0 1 0 0    Fall Risk Fall Risk  07/19/2021 05/12/2021 04/20/2020 04/30/2019 02/14/2019  Falls in the past year? 0 0 0 0 0  Comment - - - - Emmi Telephone Survey: data to providers prior to load  Number falls in past yr: 0 0 0 - -  Injury with Fall? 0 0 0 - -  Risk for fall due to : - No Fall Risks - - -  Follow up Falls evaluation completed Falls evaluation completed Falls  prevention discussed Falls evaluation completed -    FALL RISK PREVENTION PERTAINING TO THE HOME:  Any stairs in or around the home? Yes  If so, are there any without handrails? No  Home free of loose throw rugs in walkways, pet beds, electrical  cords, etc? Yes  Adequate lighting in your home to reduce risk of falls? Yes   ASSISTIVE DEVICES UTILIZED TO PREVENT FALLS:  Life alert? No  Use of a cane, walker or w/c? No  Grab bars in the bathroom? Yes  Shower chair or bench in shower? Yes  Elevated toilet seat or a handicapped toilet? Yes    Cognitive Function:  Normal cognitive status assessed by direct observation by this Nurse Health Advisor. No abnormalities found.     6CIT Screen 04/20/2020  What Year? 0 points  What month? 0 points  What time? 0 points  Count back from 20 0 points  Months in reverse 0 points  Repeat phrase 0 points  Total Score 0    Immunizations Immunization History  Administered Date(s) Administered   Fluad Quad(high Dose 65+) 04/30/2019, 04/30/2020, 05/12/2021   Influenza Whole 05/15/2007, 06/02/2009   Influenza, High Dose Seasonal PF 08/13/2017, 06/17/2018   PFIZER(Purple Top)SARS-COV-2 Vaccination 10/02/2019, 10/27/2019   Pneumococcal Conjugate-13 03/18/2015   Pneumococcal Polysaccharide-23 06/25/2012   Td 07/17/1992    TDAP status: Due, Education has been provided regarding the importance of this vaccine. Advised may receive this vaccine at local pharmacy or Health Dept. Aware to provide a copy of the vaccination record if obtained from local pharmacy or Health Dept. Verbalized acceptance and understanding.  Flu Vaccine status: Up to date  Pneumococcal vaccine status: Up to date  Covid-19 vaccine status: Completed vaccines  Qualifies for Shingles Vaccine? Yes   Zostavax completed No   Shingrix Completed?: No.    Education has been provided regarding the importance of this vaccine. Patient has been advised to call insurance company to  determine out of pocket expense if they have not yet received this vaccine. Advised may also receive vaccine at local pharmacy or Health Dept. Verbalized acceptance and understanding.  Screening Tests Health Maintenance  Topic Date Due   Zoster Vaccines- Shingrix (1 of 2) Never done   TETANUS/TDAP  07/17/2002   COVID-19 Vaccine (3 - Pfizer risk series) 11/24/2019   Pneumonia Vaccine 78+ Years old  Completed   INFLUENZA VACCINE  Completed   DEXA SCAN  Completed   HPV VACCINES  Aged Out    Health Maintenance  Health Maintenance Due  Topic Date Due   Zoster Vaccines- Shingrix (1 of 2) Never done   TETANUS/TDAP  07/17/2002   COVID-19 Vaccine (3 - Pfizer risk series) 11/24/2019    Colorectal cancer screening: No longer required.   Mammogram status: No longer required due to lumpectomy .  Bone Density status: Ordered scheduled 10/17/2021. Pt provided with contact info and advised to call to schedule appt.  Lung Cancer Screening: (Low Dose CT Chest recommended if Age 81-80 years, 30 pack-year currently smoking OR have quit w/in 15years.) does not qualify.   Lung Cancer Screening Referral: n/a  Additional Screening:  Hepatitis C Screening: does not qualify;   Vision Screening: Recommended annual ophthalmology exams for early detection of glaucoma and other disorders of the eye. Is the patient up to date with their annual eye exam?  Yes  Who is the provider or what is the name of the office in which the patient attends annual eye exams? Dr.Tepanodo  If pt is not established with a provider, would they like to be referred to a provider to establish care? No .   Dental Screening: Recommended annual dental exams for proper oral hygiene  Community Resource Referral / Chronic Care Management: CRR required this visit?  No  CCM required this visit?  No      Plan:     I have personally reviewed and noted the following in the patients chart:   Medical and social history Use of  alcohol, tobacco or illicit drugs  Current medications and supplements including opioid prescriptions.  Functional ability and status Nutritional status Physical activity Advanced directives List of other physicians Hospitalizations, surgeries, and ER visits in previous 12 months Vitals Screenings to include cognitive, depression, and falls Referrals and appointments  In addition, I have reviewed and discussed with patient certain preventive protocols, quality metrics, and best practice recommendations. A written personalized care plan for preventive services as well as general preventive health recommendations were provided to patient.     Randel Pigg, LPN   02/20/5783   Nurse Notes: none

## 2021-08-02 ENCOUNTER — Inpatient Hospital Stay: Payer: Medicare Other | Admitting: Nurse Practitioner

## 2021-08-08 ENCOUNTER — Telehealth: Payer: Self-pay

## 2021-08-08 ENCOUNTER — Inpatient Hospital Stay: Payer: Medicare Other | Attending: Nurse Practitioner | Admitting: Nurse Practitioner

## 2021-08-08 NOTE — Telephone Encounter (Signed)
This nurse reached out to patient related to missing her appointment with provider.  Patient states that she went to the wrong location this morning.  Per provider's request this nurse spoke with patient about moving her Dexa scan from April to February and then having an appointment with the provider two days following to review results and discuss anti-estrogen therapy. Patient stated that she would like to keep her appointment the same due to having too many appointments between her and her sick daughter.  Provide made aware.

## 2021-08-09 DIAGNOSIS — C50411 Malignant neoplasm of upper-outer quadrant of right female breast: Secondary | ICD-10-CM | POA: Diagnosis not present

## 2021-08-09 DIAGNOSIS — Z170 Estrogen receptor positive status [ER+]: Secondary | ICD-10-CM | POA: Diagnosis not present

## 2021-08-11 ENCOUNTER — Other Ambulatory Visit: Payer: Self-pay

## 2021-08-12 ENCOUNTER — Ambulatory Visit (INDEPENDENT_AMBULATORY_CARE_PROVIDER_SITE_OTHER): Payer: Medicare Other | Admitting: Family Medicine

## 2021-08-12 VITALS — BP 134/68 | HR 65 | Temp 97.6°F | Ht 64.0 in | Wt 174.6 lb

## 2021-08-12 DIAGNOSIS — G250 Essential tremor: Secondary | ICD-10-CM

## 2021-08-12 DIAGNOSIS — K219 Gastro-esophageal reflux disease without esophagitis: Secondary | ICD-10-CM | POA: Diagnosis not present

## 2021-08-12 DIAGNOSIS — Z853 Personal history of malignant neoplasm of breast: Secondary | ICD-10-CM | POA: Diagnosis not present

## 2021-08-12 MED ORDER — DIAZEPAM 5 MG PO TABS: 5.0000 mg | ORAL_TABLET | Freq: Three times a day (TID) | ORAL | 1 refills | Status: DC | PRN

## 2021-08-12 MED ORDER — OMEPRAZOLE 20 MG PO CPDR: 20.0000 mg | DELAYED_RELEASE_CAPSULE | Freq: Every day | ORAL | 3 refills | Status: DC

## 2021-08-12 MED ORDER — PRIMIDONE 250 MG PO TABS: 250.0000 mg | ORAL_TABLET | Freq: Three times a day (TID) | ORAL | 3 refills | Status: DC

## 2021-08-12 NOTE — Progress Notes (Signed)
[facility] [address] [place] [address] Dept: [phone] Dept Fax: [phone]  Chronic Care Office Visit  Subjective:    Patient ID: [name], female    DOB: 02-18-1934, 88 y.o..   MRN: [id number]  Chief Complaint  Patient presents with   Follow-up    3 month f/u.,     History of Present Illness:  Patient is in today for reassessment of chronic medical issues.  [name] underwent a right breast lumpectomy and sentinel lymph node biopsy on 04/20/2021. She was staged as having Stage 1a (cT1c, cN0, cM0, G2, ER+, PR+, HER2-). She declined aromatase inhibitor therapy. She recently saw her oncologist. He plans follow-up in 1 year. The original cancer was found as part of a diagnostic mammogram due to left breast pain. The left breast pain has now resolved, though she still gets a sensation of "worms" crawling under her left scapula.   [name] notes that her essential tremor is about the same. She is using Valium and primidone for management. She has not been able to follow-up with her neurologist, as he has been out on "maternity leave". She continues to provide care to her handicapped daughter (history of cerebral palsy). She notes this worries her more than about anything at this point.  Past Medical History: Patient Active Problem List   Diagnosis Date Noted   History of breast cancer, right 08/12/2021   Adrenal nodule- likely benign adenoma ([place]) 04/12/2021   Aortic atherosclerosis ([facility]) 04/12/2021   Malignant neoplasm of upper-outer quadrant of right breast in female, estrogen receptor positive ([place]) 03/28/2021   Hyperlipidemia 03/18/2021   Invasive ductal carcinoma of breast, female, right ([facility]) 02/28/2021   Vitamin D deficiency 08/05/2019   Elevated blood pressure reading without diagnosis of hypertension 04/30/2019   Allergic rhinitis 10/28/2018   OSA (obstructive sleep apnea) 11/28/2017   Iron  overload 02/21/2016   Senile nuclear sclerosis 12/10/2014   Gait difficulty 03/27/2014   Low bone mass 10/31/2012   Gastro-esophageal reflux disease without esophagitis 06/25/2012   Fecal incontinence 06/25/2012   Thoracic radiculitis 07/20/2010   Diverticulosis of colon 09/26/2007   Depression with anxiety 09/26/2007   Enlarged lymph nodes 07/26/2007   History of colonic polyps 05/15/2007   Irritable bowel syndrome 05/15/2007   Tremor, essential 02/11/2007   Osteoarthritis of both hands 02/11/2007   Past Surgical History:  Procedure Laterality Date   arthroscopy left knee     arthroscopy right knee     bmp  07/17/2000   BREAST BIOPSY Right 03/23/2021   x2   BREAST LUMPECTOMY WITH RADIOACTIVE SEED AND SENTINEL LYMPH NODE BIOPSY Right 04/20/2021   Procedure: RIGHT BREAST LUMPECTOMY WITH RADIOACTIVE SEED AND SENTINEL LYMPH NODE BIOPSY;  Surgeon: Erroll Luna, MD;  Location: [facility];  Service: General;  Laterality: Right;   CATARACT EXTRACTION, BILATERAL     COLONOSCOPY W/ POLYPECTOMY     DILATION AND CURETTAGE OF UTERUS     RIGHT FOOT SURGERIES X 2     TOTAL KNEE ARTHROPLASTY  03/13/2012   Procedure: TOTAL KNEE ARTHROPLASTY;  Surgeon: Gearlean Alf, MD;  Location: [facility];  Service: Orthopedics;  Laterality: Left;  steroid injection right knee   TOTAL KNEE ARTHROPLASTY Right 08/04/2013   Procedure: RIGHT TOTAL KNEE ARTHROPLASTY;  Surgeon: Gearlean Alf, MD;  Location[facility];  Service: Orthopedics;  Laterality: Right;   Family History  Problem Relation Age of Onset   Stroke Mother  Kidney disease Father    Cancer Maternal Grandmother        Leukemia   Kidney failure Other        renal disease   Outpatient Medications Prior to Visit  Medication Sig Dispense Refill   cholecalciferol (VITAMIN D3) 25 MCG (1000 UNIT) tablet Take 1,000 Units by mouth daily.     hyoscyamine (LEVSIN) 0.125 MG tablet Take 1 tablet (0.125 mg total) by mouth every 4 (four)  hours as needed for cramping. 60 tablet 1   diazepam (VALIUM) 5 MG tablet Take 1 tablet (5 mg total) by mouth every 8 (eight) hours as needed for anxiety. 90 tablet 1   omeprazole (PRILOSEC) 20 MG capsule Take 1 capsule (20 mg total) by mouth daily. 90 capsule 3   PRIMIDONE PO Take by mouth.     Vitamin D, Ergocalciferol, (DRISDOL) 1.25 MG (50000 UNIT) CAPS capsule Take 1 capsule (50,000 Units total) by mouth every 7 (seven) days. 5 capsule 3   hydrOXYzine (VISTARIL) 25 MG capsule Take 1 capsule (25 mg total) by mouth every 8 (eight) hours as needed. (Patient not taking: Reported on 08/12/2021) 30 capsule 0   HYDROcodone-acetaminophen (NORCO/VICODIN) 5-325 MG tablet Take 1 tablet by mouth every 6 (six) hours as needed for moderate pain. (Patient not taking: Reported on 07/19/2021) 15 tablet 0   ibuprofen (ADVIL) 800 MG tablet Take 1 tablet (800 mg total) by mouth every 8 (eight) hours as needed. 30 tablet 0   methylPREDNISolone (MEDROL DOSEPAK) 4 MG TBPK tablet Follow standard dosing instructions 21 tablet 0   No facility-administered medications prior to visit.   Allergies  Allergen Reactions   Memantine Swelling   Propranolol Other (See Comments)    Made her head feel very funny Made her head feel very funny    Codeine Nausea And Vomiting   Sulfamethoxazole Rash    REACTION: unspecified    Objective:   Today's Vitals   08/12/21 1102  BP: 134/68  Pulse: 65  Temp: 97.6 F (36.4 C)  TempSrc: Temporal  SpO2: 98%  Weight: 174 lb 9.6 oz (79.2 kg)  Height: 5' 4"  (1.626 m)   Body mass index is 29.97 kg/m.   General: Well developed, well nourished. No acute distress. Psych: Alert and oriented. Normal mood and affect.  Health Maintenance Due  Topic Date Due   Zoster Vaccines- Shingrix (1 of 2) Never done   TETANUS/TDAP  07/17/2002   COVID-19 Vaccine (3 - Pfizer risk series) 11/24/2019     Assessment & Plan:   1. History of breast cancer, right Recovered well. She will continue  annual surveillance with her oncologist.  2. Tremor, essential Stable on primidone and Valium.  - diazepam (VALIUM) 5 MG tablet; Take 1 tablet (5 mg total) by mouth every 8 (eight) hours as needed for anxiety.  Dispense: 90 tablet; Refill: 1 - primidone (MYSOLINE) 250 MG tablet; Take 1 tablet (250 mg total) by mouth 3 (three) times daily.  Dispense: 270 tablet; Refill: 3  3. Gastro-esophageal reflux disease without esophagitis Stable on omeprazole.  - omeprazole (PRILOSEC) 20 MG capsule; Take 1 capsule (20 mg total) by mouth daily.  Dispense: 90 capsule; Refill: 3  Krista Salter, MD

## 2021-09-08 ENCOUNTER — Encounter (HOSPITAL_COMMUNITY): Payer: Self-pay

## 2021-09-19 ENCOUNTER — Other Ambulatory Visit: Payer: Self-pay | Admitting: Family Medicine

## 2021-09-19 DIAGNOSIS — G250 Essential tremor: Secondary | ICD-10-CM

## 2021-09-19 MED ORDER — DIAZEPAM 5 MG PO TABS: 5.0000 mg | ORAL_TABLET | Freq: Three times a day (TID) | ORAL | 1 refills | Status: DC | PRN

## 2021-09-19 NOTE — Telephone Encounter (Signed)
Caller Name: Mandeep Ferch ?Call back phone #: [phone] ? ?MEDICATION(S): diazepam (VALIUM) 5 MG tablet [[id number]]  ? ? ?Days of Med Remaining: about a week's worth left ? ?Has the patient contacted their pharmacy (YES/NO)?  Yes ? ?IF YES, when and what did the pharmacy advise? [facility] told her this script is on back order and they don't know when they will be able to fill it. ? ? ?IF NO, request that the patient contact the pharmacy for the refills in the future.  ?           The pharmacy will send an electronic request (except for controlled medications). ? ?Preferred Pharmacy: [facility] on [address] ? ?~~~Please advise patient/caregiver to allow 2-3 business days to process RX refills. ? ?

## 2021-09-19 NOTE — Telephone Encounter (Signed)
Patient notified VIA phone. Dm/cma  

## 2021-09-19 NOTE — Telephone Encounter (Signed)
Refill request for: ?Diazepam 5 mg ?LR 08/12/21, #90, 1 rf ?LOV 08/12/21 ?FOV  02/09/22 ? ?Please review and advise.  ?Thanks.  Dm/cma ? ?

## 2021-10-11 ENCOUNTER — Other Ambulatory Visit: Payer: Self-pay | Admitting: Hematology

## 2021-10-12 ENCOUNTER — Telehealth: Payer: Self-pay | Admitting: Hematology

## 2021-10-12 NOTE — Telephone Encounter (Signed)
.  Called patient to schedule appointment per 3/28 inbasket, patient is aware of date and time.   ?

## 2021-10-17 ENCOUNTER — Ambulatory Visit
Admission: RE | Admit: 2021-10-17 | Discharge: 2021-10-17 | Disposition: A | Discharge status: Home / Self Care | Payer: Medicare Other | Source: Ambulatory Visit | Attending: Hematology | Admitting: Hematology

## 2021-10-17 ENCOUNTER — Other Ambulatory Visit: Payer: Self-pay | Admitting: Hematology

## 2021-10-17 DIAGNOSIS — Z780 Asymptomatic menopausal state: Secondary | ICD-10-CM | POA: Diagnosis not present

## 2021-10-17 DIAGNOSIS — E2839 Other primary ovarian failure: Secondary | ICD-10-CM

## 2021-10-17 DIAGNOSIS — M8589 Other specified disorders of bone density and structure, multiple sites: Secondary | ICD-10-CM | POA: Diagnosis not present

## 2021-10-17 MED ORDER — TAMOXIFEN CITRATE 20 MG PO TABS: 20.0000 mg | ORAL_TABLET | Freq: Every day | ORAL | 2 refills | Status: DC

## 2021-10-24 ENCOUNTER — Ambulatory Visit: Payer: Medicare Other | Attending: Surgery

## 2021-10-24 VITALS — Wt 177.4 lb

## 2021-10-24 DIAGNOSIS — Z483 Aftercare following surgery for neoplasm: Secondary | ICD-10-CM | POA: Insufficient documentation

## 2021-10-24 NOTE — Therapy (Signed)
?OUTPATIENT PHYSICAL THERAPY [facility] SCREENING NOTE ? ? ?Patient Name: [name] ?MRN: [id number] ?DOB:[birth year], 88 y.o., female ?Today's Date: 10/24/2021 ? ?PCP: [name], MD ?REFERRING PROVIDER: Erroll Luna, MD ? ? PT End of Session - 10/24/21 1413   ? ? Visit Number 1   # unchanged due to screen only  ? PT Start Time 1411   ? PT Stop Time 4174   ? PT Time Calculation (min) 4 min   ? Activity Tolerance Patient tolerated treatment well   ? Behavior During Therapy [facility] for tasks assessed/performed   ? ?  ?  ? ?  ? ? ?Past Medical History:  ?Diagnosis Date  ? Abnormal results of liver function studies 05/15/2007  ? Allergy   ? Anal fissure 09/26/2007  ? Formatting of this note might be different from the original. Qualifier: Diagnosis of  By: Nils Pyle CMA ([place]), Mearl Latin  ? Anxiety   ? B12 deficiency 05/05/2020  ? Chicken pox   ? Essential tremor   ? of head, sometimes hands.  pt takes primidone to help the tremors-DR. MILLER-NEUROLOGIST IN [place].  PT WAS STARTED ON VALUIM MANY YEARS AGO FOR HER TREMORS--AND CONTINUES TO TAKE.  ? Fracture of humerus, proximal, left, closed 10/28/2011  ? pt has finished physical therapy-limited ROM reaching to her back and unable to lift heavy things with left hand/arm  ? GERD (gastroesophageal reflux disease)   ? H/O hiatal hernia   ? Hereditary and idiopathic peripheral neuropathy 03/27/2014  ? Hx of colonic polyps   ? Osteoarthritis   ? PAIN AND OA BILATERAL KNEES; ALSO HAS ARTHRITIS IN HANDS AND BACK BUT NO BACK PAIN  ? Other chronic otitis externa 03/01/2009  ? Formatting of this note might be different from the original. Qualifier: Diagnosis of  By: Arnoldo Morale MD, Balinda Quails  ? ?Past Surgical History:  ?Procedure Laterality Date  ? arthroscopy left knee    ? arthroscopy right knee    ? bmp  07/17/2000  ? BREAST BIOPSY Right 03/23/2021  ? x2  ? BREAST LUMPECTOMY WITH RADIOACTIVE SEED AND SENTINEL LYMPH NODE BIOPSY Right 04/20/2021  ? Procedure: RIGHT BREAST LUMPECTOMY  WITH RADIOACTIVE SEED AND SENTINEL LYMPH NODE BIOPSY;  Surgeon: Erroll Luna, MD;  Location: [place];  Service: General;  Laterality: Right;  ? CATARACT EXTRACTION, BILATERAL    ? COLONOSCOPY W/ POLYPECTOMY    ? DILATION AND CURETTAGE OF UTERUS    ? RIGHT FOOT SURGERIES X 2    ? TOTAL KNEE ARTHROPLASTY  03/13/2012  ? Procedure: TOTAL KNEE ARTHROPLASTY;  Surgeon: Gearlean Alf, MD;  Location: [facility];  Service: Orthopedics;  Laterality: Left;  steroid injection right knee  ? TOTAL KNEE ARTHROPLASTY Right 08/04/2013  ? Procedure: RIGHT TOTAL KNEE ARTHROPLASTY;  Surgeon: Gearlean Alf, MD;  Location: [facility];  Service: Orthopedics;  Laterality: Right;  ? ?Patient Active Problem List  ? Diagnosis Date Noted  ? History of breast cancer, right 08/12/2021  ? Adrenal nodule- likely benign adenoma ([place]) 04/12/2021  ? Aortic atherosclerosis ([place]) 04/12/2021  ? Malignant neoplasm of upper-outer quadrant of right breast in female, estrogen receptor positive ([place]) 03/28/2021  ? Hyperlipidemia 03/18/2021  ? Invasive ductal carcinoma of breast, female, right ([place]) 02/28/2021  ? Vitamin D deficiency 08/05/2019  ? Elevated blood pressure reading without diagnosis of hypertension 04/30/2019  ? Allergic rhinitis 10/28/2018  ? OSA (obstructive sleep apnea) 11/28/2017  ? Iron overload 02/21/2016  ? Senile nuclear sclerosis 12/10/2014  ?  Gait difficulty 03/27/2014  ? Low bone mass 10/31/2012  ? Gastro-esophageal reflux disease without esophagitis 06/25/2012  ? Fecal incontinence 06/25/2012  ? Thoracic radiculitis 07/20/2010  ? Diverticulosis of colon 09/26/2007  ? Depression with anxiety 09/26/2007  ? Enlarged lymph nodes 07/26/2007  ? History of colonic polyps 05/15/2007  ? Irritable bowel syndrome 05/15/2007  ? Tremor, essential 02/11/2007  ? Osteoarthritis of both hands 02/11/2007  ? ? ?REFERRING DIAG: right breast cancer at risk for lymphedema ? ?THERAPY DIAG: Aftercare following surgery for  neoplasm ? ?PERTINENT HISTORY: Patient was diagnosed on 02/28/2021 with right grade II invasive ductal carcinoma breast cancer. She underwent a right lumpectomy and sentinel node biopsy (5 negative nodes) on 04/20/2021. It is ER/PR positive and HER2 negative with a Ki67 of 1%. She had bilateral knee replacements 10 years ago.  ? ?PRECAUTIONS: right UE Lymphedema risk, None ? ?SUBJECTIVE: Pt returns for her 3 month L-Dex screen.  ? ?PAIN:  ?Are you having pain? No ? ?SOZO SCREENING: ?Patient was assessed today using the SOZO machine to determine the lymphedema index score. This was compared to her baseline score. It was determined that she is within the recommended range when compared to her baseline and no further action is needed at this time. She will continue SOZO screenings. These are done every 3 months for 2 years post operatively followed by every 6 months for 2 years, and then annually. ? ? ? ?Otelia Limes, PTA ?10/24/2021, 3:12 PM ? ?  ? ?

## 2021-10-25 ENCOUNTER — Ambulatory Visit (INDEPENDENT_AMBULATORY_CARE_PROVIDER_SITE_OTHER): Payer: Medicare Other | Admitting: Family Medicine

## 2021-10-25 VITALS — BP 124/76 | HR 70 | Temp 97.6°F | Ht 64.0 in | Wt 178.0 lb

## 2021-10-25 DIAGNOSIS — G250 Essential tremor: Secondary | ICD-10-CM

## 2021-10-25 DIAGNOSIS — Z853 Personal history of malignant neoplasm of breast: Secondary | ICD-10-CM | POA: Diagnosis not present

## 2021-10-25 DIAGNOSIS — M8589 Other specified disorders of bone density and structure, multiple sites: Secondary | ICD-10-CM | POA: Diagnosis not present

## 2021-10-25 NOTE — Progress Notes (Signed)
?[facility] ?[facility] ?[place] ?[place] [address] ?Dept: [phone] ?Dept Fax: [phone] ? ?Chronic Care Office Visit ? ?Subjective:  ? ? Patient ID: [name], female    DOB: 1934-04-29, 88 y.o..   MRN: [id number] ? ?Chief Complaint  ?Patient presents with  ? Follow-up  ?  F/u to discuss meds.  ? ? ?History of Present Illness: ? ?Patient is in today for reassessment of chronic medical issues. ? ?[name]'s mail order pharmacy had reached out with a concern about her being prescribed both primidone and tamoxifen. [name] had previously declined to take Tamoxifen as part of adjunctive therapy related to her right breast cancer. Apparently, she had a recent bone density test which showed osteopenia, but an increased risk for fracture. Dr. Burr Medico had ordered Tamoxifen for management of this. [name] states Dr. Burr Medico never discussed this with her. She continues to not want to be on this medication, as she does not want to go off of her primidone. She has been on primidone and diazepam for quite some time related to management of an essential tremor. She finds the medications have made this tolerable. ? ?[name] does note that she went to a cancer rehab facility yesterday. She describes that they did some sort of test that involved standing on a machine. She was told that this was normal. She has no idea what they were testing for. ? ?Past Medical History: ?Patient Active Problem List  ? Diagnosis Date Noted  ? History of breast cancer, right 08/12/2021  ? Adrenal nodule- likely benign adenoma ([place]) 04/12/2021  ? Aortic atherosclerosis ([place]) 04/12/2021  ? Malignant neoplasm of upper-outer quadrant of right breast in female, estrogen receptor positive ([place]) 03/28/2021  ? Hyperlipidemia 03/18/2021  ? Invasive ductal carcinoma of breast, female, right ([place]) 02/28/2021  ? Vitamin D deficiency 08/05/2019  ? Elevated blood pressure reading without  diagnosis of hypertension 04/30/2019  ? Allergic rhinitis 10/28/2018  ? OSA (obstructive sleep apnea) 11/28/2017  ? Iron overload 02/21/2016  ? Senile nuclear sclerosis 12/10/2014  ? Gait difficulty 03/27/2014  ? Osteopenia 10/31/2012  ? Gastro-esophageal reflux disease without esophagitis 06/25/2012  ? Fecal incontinence 06/25/2012  ? Thoracic radiculitis 07/20/2010  ? Diverticulosis of colon 09/26/2007  ? Depression with anxiety 09/26/2007  ? Enlarged lymph nodes 07/26/2007  ? History of colonic polyps 05/15/2007  ? Irritable bowel syndrome 05/15/2007  ? Tremor, essential 02/11/2007  ? Osteoarthritis of both hands 02/11/2007  ? ?Past Surgical History:  ?Procedure Laterality Date  ? arthroscopy left knee    ? arthroscopy right knee    ? bmp  07/17/2000  ? BREAST BIOPSY Right 03/23/2021  ? x2  ? BREAST LUMPECTOMY WITH RADIOACTIVE SEED AND SENTINEL LYMPH NODE BIOPSY Right 04/20/2021  ? Procedure: RIGHT BREAST LUMPECTOMY WITH RADIOACTIVE SEED AND SENTINEL LYMPH NODE BIOPSY;  Surgeon: Erroll Luna, MD;  Location: [place];  Service: General;  Laterality: Right;  ? CATARACT EXTRACTION, BILATERAL    ? COLONOSCOPY W/ POLYPECTOMY    ? DILATION AND CURETTAGE OF UTERUS    ? RIGHT FOOT SURGERIES X 2    ? TOTAL KNEE ARTHROPLASTY  03/13/2012  ? Procedure: TOTAL KNEE ARTHROPLASTY;  Surgeon: Gearlean Alf, MD;  Location: [facility];  Service: Orthopedics;  Laterality: Left;  steroid injection right knee  ? TOTAL KNEE ARTHROPLASTY Right 08/04/2013  ? Procedure: RIGHT TOTAL KNEE ARTHROPLASTY;  Surgeon: Gearlean Alf, MD;  Location: [facility];  Service: Orthopedics;  Laterality: Right;  ? ?Family History  ?Problem Relation Age of Onset  ? Stroke Mother   ? Kidney disease Father   ? Cancer Maternal Grandmother   ?     Leukemia  ? Kidney failure Other   ?     renal disease  ? ?Outpatient Medications Prior to Visit  ?Medication Sig Dispense Refill  ? cholecalciferol (VITAMIN D3) 25 MCG (1000 UNIT) tablet Take 1,000  Units by mouth daily.    ? diazepam (VALIUM) 5 MG tablet Take 1 tablet (5 mg total) by mouth every 8 (eight) hours as needed for anxiety. 90 tablet 1  ? hyoscyamine (LEVSIN) 0.125 MG tablet Take 1 tablet (0.125 mg total) by mouth every 4 (four) hours as needed for cramping. 60 tablet 1  ? omeprazole (PRILOSEC) 20 MG capsule Take 1 capsule (20 mg total) by mouth daily. 90 capsule 3  ? primidone (MYSOLINE) 250 MG tablet Take 1 tablet (250 mg total) by mouth 3 (three) times daily. 270 tablet 3  ? hydrOXYzine (VISTARIL) 25 MG capsule Take 1 capsule (25 mg total) by mouth every 8 (eight) hours as needed. (Patient not taking: Reported on 08/12/2021) 30 capsule 0  ? tamoxifen (NOLVADEX) 20 MG tablet Take 1 tablet (20 mg total) by mouth daily. (Patient not taking: Reported on 10/25/2021) 30 tablet 2  ? ?No facility-administered medications prior to visit.  ? ?Allergies  ?Allergen Reactions  ? Memantine Swelling  ? Propranolol Other (See Comments)  ?  Made her head feel very funny ?Made her head feel very funny ?  ? Codeine Nausea And Vomiting  ? Sulfamethoxazole Rash  ?  REACTION: unspecified  ?   ?Objective:  ? ?Today's Vitals  ? 10/25/21 1314  ?BP: 124/76  ?Pulse: 70  ?Temp: 97.6 ?F (36.4 ?C)  ?TempSrc: Temporal  ?SpO2: 98%  ?Weight: 178 lb (80.7 kg)  ?Height: '5\' 4"'$  (1.626 m)  ? ?Body mass index is 30.55 kg/m?.  ? ?General: Well developed, well nourished. No acute distress. ?HEENT: Normocephalic, non-traumatic. External ears normal. EAC and TMs normal bilaterally. PERRL, EOMI. Conjunctiva clear. Fundiscopic exam shows normal disc and vasculature. Nose   ?clear without congestion or rhinorrhea. Mucous membranes moist. Oropharynx clear. Good dentition. ?Neck: Supple. No lymphadenopathy. No thyromegaly. ?Lungs: Clear to auscultation bilaterally. No wheezing, rales or rhonchi. ?CV: RRR without murmurs or rubs. Pulses 2+ bilaterally. ?Abdomen: Soft, non-tender. Bowel sounds positive, normal pitch and frequency. No  hepatosplenomegaly. No rebound or guarding. ?Back: Straight. No CVA tenderness bilaterally. ?Extremities: Full ROM. No joint swelling or tenderness. No edema noted. ?Skin: Warm and dry. No rashes. ?Neuro: CN II-XII intact. Normal sensation and DTR bilaterally. ?Psych: Alert and oriented. Normal mood and affect. ? ?Health Maintenance Due  ?Topic Date Due  ? Zoster Vaccines- Shingrix (1 of 2) Never done  ? TETANUS/TDAP  07/17/2002  ? ?Imaging: ?Bone Density (10/17/2021) ?ASSESSMENT: ?The BMD measured at Femur Neck Left is 0.741 g/cm2 with a T-score of -2.1. This patient is considered osteopenic/low bone mass according to [facility]) criteria. ?  ?The quality of the exam is good. The lumbar spine was excluded due to degenerative changes. ?  ?Site Region Measured Date Measured Age YA BMD Significant CHANGE T-score ?DualFemur Neck Left 10/17/2021 85.7 -2.1 0.741 g/cm2 * ?DualFemur Neck Left 04/13/2017 81.2 -1.7 0.799 g/cm2 * ?  ?DualFemur Total Mean 10/17/2021 85.7 -1.5 0.824 g/cm2 ?DualFemur Total Mean 04/13/2017 81.2 -1.3 0.839 g/cm2 ?  ?Left Forearm Radius 33% 10/17/2021  85.7 -1.8 0.720 g/cm2 ? ?FRAX* 10-year Probability of Fracture Based on femoral neck BMD: DualFemur (Left) ?  ?Major Osteoporotic Fracture: 22.3% ?Hip Fracture:                6.6% ?Population:                  [place] (Caucasian) ?Risk Factors:                History of Fracture (Adult) (V15.51) ? ?Lab Results ?Last CBC ?Lab Results  ?Component Value Date  ? WBC 5.0 03/30/2021  ? HGB 13.8 03/30/2021  ? HCT 39.6 03/30/2021  ? MCV 95.2 03/30/2021  ? MCH 33.2 03/30/2021  ? RDW 12.2 03/30/2021  ? PLT 168 03/30/2021  ? ?Last metabolic panel ?Lab Results  ?Component Value Date  ? GLUCOSE 95 03/30/2021  ? NA 139 03/30/2021  ? K 4.3 03/30/2021  ? CL 104 03/30/2021  ? CO2 26 03/30/2021  ? BUN 12 03/30/2021  ? CREATININE 0.85 03/30/2021  ? GFRNONAA >60 03/30/2021  ? CALCIUM 9.4 03/30/2021  ? PROT 7.3 03/30/2021  ? ALBUMIN 4.0 03/30/2021  ?  BILITOT 0.5 03/30/2021  ? ALKPHOS 70 03/30/2021  ? AST 21 03/30/2021  ? ALT 25 03/30/2021  ? ANIONGAP 9 03/30/2021  ? ?Assessment & Plan:  ? ?1. Tremor, essential ?Ms. Mahn's tremor has been well managed on primidone and dia

## 2021-10-27 ENCOUNTER — Other Ambulatory Visit: Payer: Medicare Other

## 2021-10-27 ENCOUNTER — Ambulatory Visit: Payer: Medicare Other | Admitting: Hematology

## 2021-11-16 ENCOUNTER — Other Ambulatory Visit: Payer: Self-pay

## 2021-11-16 ENCOUNTER — Inpatient Hospital Stay: Payer: Medicare Other | Attending: Nurse Practitioner

## 2021-11-16 ENCOUNTER — Encounter: Payer: Self-pay | Admitting: Hematology

## 2021-11-16 ENCOUNTER — Inpatient Hospital Stay (HOSPITAL_BASED_OUTPATIENT_CLINIC_OR_DEPARTMENT_OTHER): Payer: Medicare Other | Admitting: Hematology

## 2021-11-16 VITALS — BP 135/87 | HR 68 | Temp 97.8°F | Resp 18 | Ht 64.0 in | Wt 178.2 lb

## 2021-11-16 DIAGNOSIS — R0789 Other chest pain: Secondary | ICD-10-CM | POA: Diagnosis not present

## 2021-11-16 DIAGNOSIS — Z170 Estrogen receptor positive status [ER+]: Secondary | ICD-10-CM | POA: Diagnosis not present

## 2021-11-16 DIAGNOSIS — C50411 Malignant neoplasm of upper-outer quadrant of right female breast: Secondary | ICD-10-CM

## 2021-11-16 DIAGNOSIS — M8580 Other specified disorders of bone density and structure, unspecified site: Secondary | ICD-10-CM | POA: Diagnosis not present

## 2021-11-16 LAB — CMP (CANCER CENTER ONLY)
ALT: 19 U/L (ref 0–44)
AST: 21 U/L (ref 15–41)
Albumin: 4.3 g/dL (ref 3.5–5.0)
Alkaline Phosphatase: 69 U/L (ref 38–126)
Anion gap: 6 (ref 5–15)
BUN: 15 mg/dL (ref 8–23)
CO2: 30 mmol/L (ref 22–32)
Calcium: 9.6 mg/dL (ref 8.9–10.3)
Chloride: 104 mmol/L (ref 98–111)
Creatinine: 0.96 mg/dL (ref 0.44–1.00)
GFR, Estimated: 58 mL/min — ABNORMAL LOW (ref >60)
Glucose, Bld: 91 mg/dL (ref 70–99)
Potassium: 4.7 mmol/L (ref 3.5–5.1)
Sodium: 140 mmol/L (ref 135–145)
Total Bilirubin: 0.5 mg/dL (ref 0.3–1.2)
Total Protein: 7.5 g/dL (ref 6.5–8.1)

## 2021-11-16 LAB — CBC WITH DIFFERENTIAL (CANCER CENTER ONLY)
Abs Immature Granulocytes: 0.01 10*3/uL (ref 0.00–0.07)
Basophils Absolute: 0 10*3/uL (ref 0.0–0.1)
Basophils Relative: 1 %
Eosinophils Absolute: 0.1 10*3/uL (ref 0.0–0.5)
Eosinophils Relative: 2 %
HCT: 39.1 % (ref 36.0–46.0)
Hemoglobin: 13.7 g/dL (ref 12.0–15.0)
Immature Granulocytes: 0 %
Lymphocytes Relative: 51 %
Lymphs Abs: 2.4 10*3/uL (ref 0.7–4.0)
MCH: 33.4 pg (ref 26.0–34.0)
MCHC: 35 g/dL (ref 30.0–36.0)
MCV: 95.4 fL (ref 80.0–100.0)
Monocytes Absolute: 0.4 10*3/uL (ref 0.1–1.0)
Monocytes Relative: 8 %
Neutro Abs: 1.8 10*3/uL (ref 1.7–7.7)
Neutrophils Relative %: 38 %
Platelet Count: 163 10*3/uL (ref 150–400)
RBC: 4.1 MIL/uL (ref 3.87–5.11)
RDW: 12.1 % (ref 11.5–15.5)
WBC Count: 4.7 10*3/uL (ref 4.0–10.5)
nRBC: 0 % (ref 0.0–0.2)

## 2021-11-16 NOTE — Progress Notes (Signed)
?[facility]   ?Telephone:(336) [phone] [phone]   ?Clinic Follow up Note  ? ?Patient Care Team: ?[name], MD as PCP - General (Family Medicine) ?Janace Litten, MD as Referring Physician (Neurology) ?Roel Cluck, MD as Referring Physician (Ophthalmology) ?Erroll Luna, MD as Consulting Physician (General Surgery) ?Truitt Merle, MD as Consulting Physician (Hematology) ?Gery Pray, MD as Consulting Physician (Radiation Oncology) ?Mauro Kaufmann, RN as Oncology Nurse Navigator ?Rockwell Germany, RN as Oncology Nurse Navigator ?Alla Feeling, NP as Nurse Practitioner (Nurse Practitioner) ? ?Date of Service:  11/16/2021 ? ?CHIEF COMPLAINT: f/u of right breast cancer ? ?CURRENT THERAPY:  ?Surveillance ? ?ASSESSMENT & PLAN:  ?[name] is a 88 y.o. post-menopausal female with  ? ?1. Malignant neoplasm of upper-outer quadrant of right breast, Stage IA, p(T1c, N0), ER+/PR+/HER2-, Grade 2 ?-presented with left breast pain. S/p right lumpectomy on 04/20/21, pathology showed IDC, grade 2, 1.7 cm; DCIS; negative margins and lymph nodes. ?-radiation deferred due to her advanced age and stage I disease. ?-I called in tamoxifen for her to start after DEXA on 10/17/21 showed worsening osteopenia. After speaking with her PCP, they opted not to take it due to interaction with her tremor medication (primidone) which she can not come off.  Due to her osteopenia and moderate arthralgia, she is not a good candidate for AI either. ?-she is clinically doing well from a breast cancer standpoint. Labs reviewed, WNL. Physical exam was unremarkable. There is no clinical concern for recurrence. ?-We also discussed the breast cancer surveillance after her surgery. She will continue annual screening mammogram, self exam, and a routine office visit with lab and exam with [place]. ?-we will see her back in 6 months, and Dr. Brantley Stage will see her in 07/2022. ?  ?2. Osteopenia ?-Her most recent DEXA was  04/13/17 showing osteopenia (T-score of -1.7). Repeat on 10/17/21 showed worsening osteopenia, to -2.1. ?-She is taking vit D. ?  ?3. Left chest pain and tenderness  ?-uncertain of etiology ?-chest CT 04/11/21 showed no acute abnormalities. ?  ?  ?PLAN:  ?-continue surveillance ?-lab and f/u with NP Lacie in 6 months ?-f/u with Dr. Brantley Stage in 07/2022 ? ? ?No problem-specific Assessment & Plan notes found for this encounter. ? ? ?SUMMARY OF ONCOLOGIC HISTORY: ?Oncology History Overview Note  ?Cancer Staging ?Malignant neoplasm of upper-outer quadrant of right breast in female, estrogen receptor positive ([place]) ?Staging form: Breast, AJCC 8th Edition ?- Clinical stage from 03/23/2021: Stage IA (cT1c, cN0, cM0, G2, ER+, PR+, HER2-) - Signed by Truitt Merle, MD on 03/30/2021 ?- Pathologic stage from 04/20/2021: Stage IA (pT1c, pN0, cM0, G2, ER+, PR+, HER2-) - Signed by Truitt Merle, MD on 04/28/2021 ? ? ?  ?Malignant neoplasm of upper-outer quadrant of right breast in female, estrogen receptor positive ([place])  ?02/28/2021 Mammogram  ? Bilateral Diagnostic and Bilateral Breast Ultrasound ? ?IMPRESSION: ?1. 1.5 cm mass with imaging features highly suspicious for ?malignancy in the 12 o'clock position of the right breast. ?2. 2 right axillary lymph nodes with borderline eccentric cortical ?thickening, suspicious for possible metastatic nodes. ?3. No evidence of malignancy on the left. ?  ?03/23/2021 Cancer Staging  ? Staging form: Breast, AJCC 8th Edition ?- Clinical stage from 03/23/2021: Stage IA (cT1c, cN0, cM0, G2, ER+, PR+, HER2-) - Signed by Truitt Merle, MD on 03/30/2021 ?Stage prefix: Initial diagnosis ?Histologic grading system: 3 grade system ? ?  ?03/23/2021 Pathology Results  ? Diagnosis ?1. Breast, right, needle core  biopsy, 12:00 3cm fn, ribbon clip ?- INVASIVE DUCTAL CARCINOMA. SEE NOTE ?- DUCTAL CARCINOMA IN SITU, LOW-GRADE ?2. Lymph node, needle/core biopsy, right axilla, q clip ?- BENIGN LYMPH NODE ?Diagnosis Note ?1. Carcinoma  measures 1.2 cm in greatest linear dimension and appears grade 2. ? ?Prognostic panel pending. ?  ?03/28/2021 Initial Diagnosis  ? Malignant neoplasm of upper-outer quadrant of right breast in female, estrogen receptor positive ([place]) ? ?  ?04/20/2021 Cancer Staging  ? Staging form: Breast, AJCC 8th Edition ?- Pathologic stage from 04/20/2021: Stage IA (pT1c, pN0, cM0, G2, ER+, PR+, HER2-) - Signed by Truitt Merle, MD on 04/28/2021 ?Stage prefix: Initial diagnosis ?Multigene prognostic tests performed: None ?Histologic grading system: 3 grade system ?Residual tumor (R): R0 - None ? ?  ?04/20/2021 Pathology Results  ? FINAL MICROSCOPIC DIAGNOSIS:  ? ?A. BREAST, RIGHT, LUMPECTOMY:  ?-  Invasive ductal carcinoma, Nottingham grade 2 and of 3, 1.7 cm  ?-  Ductal carcinoma in-situ, low to intermediate grade  ?-  Margins uninvolved by carcinoma (see part B below)  ?-  Previous biopsy site changes present  ?-  See oncology table and comment below  ? ?B. BREAST, RIGHT ADDITIONAL ANTERIOR-LATERAL MARGIN, EXCISION:  ?-  No residual carcinoma identified  ? ?C. LYMPH NODE, RIGHT AXILLARY, SENTINEL, EXCISION:  ?-  No carcinoma identified in one lymph node (0/1)  ? ?D. LYMPH NODE, RIGHT AXILLARY, SENTINEL, EXCISION:  ?-  No carcinoma identified in one lymph node (0/1)  ?  ? ? ? ?INTERVAL HISTORY:  ?[name] is here for a follow up of breast cancer. She was last seen by me on 04/28/22. She presents to the clinic alone. She tells me she drove herself. ?She reports she is doing well overall, except for ongoing left breast/axilla tenderness.  ?She tells me about how she experienced an allergic reaction following her surgery, likely from the sentinel lymph node mapping. ?She reports she is independent-- she lives alone and is able to do everything for herself. She notes she helps care for her disabled daughter, who's husband has passed away; she lives nearby. ?  ?All other systems were reviewed with the patient and are  negative. ? ?MEDICAL HISTORY:  ?Past Medical History:  ?Diagnosis Date  ? Abnormal results of liver function studies 05/15/2007  ? Allergy   ? Anal fissure 09/26/2007  ? Formatting of this note might be different from the original. Qualifier: Diagnosis of  By: Nils Pyle CMA ([place]), Mearl Latin  ? Anxiety   ? B12 deficiency 05/05/2020  ? Chicken pox   ? Essential tremor   ? of head, sometimes hands.  pt takes primidone to help the tremors-DR. MILLER-NEUROLOGIST IN [place].  PT WAS STARTED ON VALUIM MANY YEARS AGO FOR HER TREMORS--AND CONTINUES TO TAKE.  ? Fracture of humerus, proximal, left, closed 10/28/2011  ? pt has finished physical therapy-limited ROM reaching to her back and unable to lift heavy things with left hand/arm  ? GERD (gastroesophageal reflux disease)   ? H/O hiatal hernia   ? Hereditary and idiopathic peripheral neuropathy 03/27/2014  ? Hx of colonic polyps   ? Osteoarthritis   ? PAIN AND OA BILATERAL KNEES; ALSO HAS ARTHRITIS IN HANDS AND BACK BUT NO BACK PAIN  ? Other chronic otitis externa 03/01/2009  ? Formatting of this note might be different from the original. Qualifier: Diagnosis of  By: Arnoldo Morale MD, Balinda Quails  ? ? ?SURGICAL HISTORY: ?Past Surgical History:  ?Procedure Laterality Date  ? arthroscopy  left knee    ? arthroscopy right knee    ? bmp  07/17/2000  ? BREAST BIOPSY Right 03/23/2021  ? x2  ? BREAST LUMPECTOMY WITH RADIOACTIVE SEED AND SENTINEL LYMPH NODE BIOPSY Right 04/20/2021  ? Procedure: RIGHT BREAST LUMPECTOMY WITH RADIOACTIVE SEED AND SENTINEL LYMPH NODE BIOPSY;  Surgeon: Erroll Luna, MD;  Location: [place];  Service: General;  Laterality: Right;  ? CATARACT EXTRACTION, BILATERAL    ? COLONOSCOPY W/ POLYPECTOMY    ? DILATION AND CURETTAGE OF UTERUS    ? RIGHT FOOT SURGERIES X 2    ? TOTAL KNEE ARTHROPLASTY  03/13/2012  ? Procedure: TOTAL KNEE ARTHROPLASTY;  Surgeon: Gearlean Alf, MD;  [facility];  Service: Orthopedics;  Laterality: Left;  steroid injection  right knee  ? TOTAL KNEE ARTHROPLASTY Right 08/04/2013  ? Procedure: RIGHT TOTAL KNEE ARTHROPLASTY;  Surgeon: Gearlean Alf, MD;  [facility];  Service: Orthopedics;  Laterality: Right;  ? ? ?I have reviewed the soc

## 2021-11-28 ENCOUNTER — Other Ambulatory Visit: Payer: Self-pay | Admitting: Family Medicine

## 2021-11-28 DIAGNOSIS — G250 Essential tremor: Secondary | ICD-10-CM

## 2021-11-28 NOTE — Telephone Encounter (Signed)
Refill request for  ?Diazepam 5 mg ?LR 09/19/21, #90, 1 rf ?LOV 10/25/21 ?FOV 02/09/22 ? ? ?Please review and advise.  ?Thanks. Dm/cma ? ?

## 2021-11-30 DIAGNOSIS — U071 COVID-19: Secondary | ICD-10-CM | POA: Diagnosis not present

## 2021-11-30 DIAGNOSIS — R509 Fever, unspecified: Secondary | ICD-10-CM | POA: Diagnosis not present

## 2021-12-07 ENCOUNTER — Telehealth: Payer: Self-pay | Admitting: Family Medicine

## 2021-12-07 NOTE — Telephone Encounter (Signed)
Pt tested positive for covid she has finished her meds but still feels weak. She would like to know if she can go out of the house now is she contagious? What is the protocol?

## 2021-12-07 NOTE — Telephone Encounter (Signed)
Spoke to patient, advised to wear a mask when she goes out.  Dm/cma

## 2022-02-08 ENCOUNTER — Encounter: Payer: Self-pay | Admitting: Family Medicine

## 2022-02-08 ENCOUNTER — Ambulatory Visit (INDEPENDENT_AMBULATORY_CARE_PROVIDER_SITE_OTHER): Payer: Medicare Other | Admitting: Family Medicine

## 2022-02-08 VITALS — BP 120/66 | HR 75 | Temp 98.0°F | Ht 64.0 in | Wt 182.0 lb

## 2022-02-08 DIAGNOSIS — U099 Post covid-19 condition, unspecified: Secondary | ICD-10-CM | POA: Diagnosis not present

## 2022-02-08 DIAGNOSIS — R0902 Hypoxemia: Secondary | ICD-10-CM

## 2022-02-08 DIAGNOSIS — R600 Localized edema: Secondary | ICD-10-CM | POA: Diagnosis not present

## 2022-02-08 DIAGNOSIS — G9332 Myalgic encephalomyelitis/chronic fatigue syndrome: Secondary | ICD-10-CM | POA: Diagnosis not present

## 2022-02-08 NOTE — Progress Notes (Signed)
[facility] [address] [place] [address] Dept: [phone] Dept Fax: [phone]  Office Visit  Subjective:    Patient ID: [name], female    DOB: 1934-04-17, 88 y.o..   MRN: [id number]  No chief complaint on file.   History of Present Illness:  Patient is in today complaining of multiple symptoms since having had COVID. She notes she became ill with COVID on 11/30/2021. She was treated with a course of Paxlovid. She has only had two primary COVID vaccines and no boosters. Since her acute illness resolved, she has had profound fatigue. She notes she feels like she has no energy. She finds if she gets up and does any small task, she then has to go back to bed. She is falling asleep easily during the day. She also notes an odd sensation, like her head feel swollen. She notes lightheadedness with standing. She has a history of a chronic essential tremor. She feels the tremor is now worsened. Her daughter arranged for Ms. Moutlon to have a vitamin infusion given at home and now she is taking a supplemental vitamin.  Past Medical History: Patient Active Problem List   Diagnosis Date Noted   History of breast cancer, right 08/12/2021   Adrenal nodule- likely benign adenoma ([place]) 04/12/2021   Aortic atherosclerosis ([place]) 04/12/2021   Malignant neoplasm of upper-outer quadrant of right breast in female, estrogen receptor positive ([place]) 03/28/2021   Hyperlipidemia 03/18/2021   Invasive ductal carcinoma of breast, female, right ([place]) 02/28/2021   Vitamin D deficiency 08/05/2019   Elevated blood pressure reading without diagnosis of hypertension 04/30/2019   Allergic rhinitis 10/28/2018   OSA (obstructive sleep apnea) 11/28/2017   Iron overload 02/21/2016   Senile nuclear sclerosis 12/10/2014   Gait difficulty 03/27/2014   Osteopenia 10/31/2012   Gastro-esophageal reflux disease without esophagitis 06/25/2012   Fecal  incontinence 06/25/2012   Thoracic radiculitis 07/20/2010   Diverticulosis of colon 09/26/2007   Depression with anxiety 09/26/2007   Enlarged lymph nodes 07/26/2007   History of colonic polyps 05/15/2007   Irritable bowel syndrome 05/15/2007   Tremor, essential 02/11/2007   Osteoarthritis of both hands 02/11/2007   Past Surgical History:  Procedure Laterality Date   arthroscopy left knee     arthroscopy right knee     bmp  07/17/2000   BREAST BIOPSY Right 03/23/2021   x2   BREAST LUMPECTOMY WITH RADIOACTIVE SEED AND SENTINEL LYMPH NODE BIOPSY Right 04/20/2021   Procedure: RIGHT BREAST LUMPECTOMY WITH RADIOACTIVE SEED AND SENTINEL LYMPH NODE BIOPSY;  Surgeon: Erroll Luna, MD;  Location: [facility];  Service: General;  Laterality: Right;   CATARACT EXTRACTION, BILATERAL     COLONOSCOPY W/ POLYPECTOMY     DILATION AND CURETTAGE OF UTERUS     RIGHT FOOT SURGERIES X 2     TOTAL KNEE ARTHROPLASTY  03/13/2012   Procedure: TOTAL KNEE ARTHROPLASTY;  Surgeon: Gearlean Alf, MD;  Location: [facility];  Service: Orthopedics;  Laterality: Left;  steroid injection right knee   TOTAL KNEE ARTHROPLASTY Right 08/04/2013   Procedure: RIGHT TOTAL KNEE ARTHROPLASTY;  Surgeon: Gearlean Alf, MD;  Location: [facility];  Service: Orthopedics;  Laterality: Right;   Family History  Problem Relation Age of Onset   Stroke Mother    Kidney disease Father    Cancer Maternal Grandmother        Leukemia   Kidney failure Other  renal disease   Outpatient Medications Prior to Visit  Medication Sig Dispense Refill   cholecalciferol (VITAMIN D3) 25 MCG (1000 UNIT) tablet Take 1,000 Units by mouth daily.     diazepam (VALIUM) 5 MG tablet TAKE 1 TABLET(5 MG) BY MOUTH EVERY 8 HOURS AS NEEDED FOR ANXIETY 90 tablet 3   hyoscyamine (LEVSIN) 0.125 MG tablet Take 1 tablet (0.125 mg total) by mouth every 4 (four) hours as needed for cramping. 60 tablet 1   Multiple Vitamin (MULTIVITAMIN) tablet  Take 1 tablet by mouth daily.     omeprazole (PRILOSEC) 20 MG capsule Take 1 capsule (20 mg total) by mouth daily. 90 capsule 3   primidone (MYSOLINE) 250 MG tablet Take 1 tablet (250 mg total) by mouth 3 (three) times daily. 270 tablet 3   No facility-administered medications prior to visit.   Allergies  Allergen Reactions   Memantine Swelling   Propranolol Other (See Comments)    Made her head feel very funny Made her head feel very funny    Codeine Nausea And Vomiting   Sulfamethoxazole Rash    REACTION: unspecified     Objective:   Today's Vitals   02/08/22 1534  BP: 120/66  Pulse: 75  Temp: 98 F (36.7 C)  TempSrc: Temporal  SpO2: 93%  Weight: 182 lb (82.6 kg)  Height: '5\' 4"'$  (1.626 m)   Body mass index is 31.24 kg/m.   General: Well developed, well nourished. No acute distress. HEENT: Normocephalic, non-traumatic. PERRL, EOMI. Conjunctiva clear. External ears normal. EAC and TMs   normal bilaterally. Nose clear without congestion or rhinorrhea. Mucous membranes moist. Oropharynx clear.   Good dentition. Neck: Supple. No lymphadenopathy. No thyromegaly. Lungs: Clear to auscultation bilaterally. No wheezing, rales or rhonchi. CV: RRR with a II/VI holosystolic murmur. Pulses 2+ bilaterally. Extremities: 1+ lower leg edema. Neuro: No focal neurological deficits. Psych: Alert and oriented. Normal mood and affect.  Health Maintenance Due  Topic Date Due   Zoster Vaccines- Shingrix (1 of 2) Never done   TETANUS/TDAP  07/17/2002     Assessment & Plan:   1. COVID-19 long hauler manifesting chronic fatigue We discussed that fatigue and various other neurological complaints are the most common long COVID symptoms for patients that develop this issue. As Ms. Dacquisto has had fatigue issues int he past, this has likely exacerbated this. I will check screening labs per expert opinion on assessment of fatigue associated with long COVID.  - CBC with Differential/Platelet -  Comprehensive metabolic panel - TSH - C-reactive protein - CK (Creatine Kinase) - B Nat Peptide - Ambulatory referral to Pulmonology  2. Hypoxia Ms. Eklund's O2 sat is decreased form her usual (Current 93%, usual 98-100%). She also may be having some worsening of her OSA. I will refer her to pulmonology to assess, as hypoxia and/or worsened OSA could be exacerbating her fatigue.  - B Nat Peptide - Ambulatory referral to Pulmonology  3. Pedal edema Ms. Meneely's weight is up about 4 lbs. from her usual and she does have some lower leg edema. I will check a BNP to assess for potential CHF. She had an echocardiogram in 2021, which showed some mild diastolic dysfunction, but no overt HF. If the BNP is high, I would consider a cardiology referral.  - B Nat Peptide   Return for After pulmonary referral.   [name], MD

## 2022-02-09 ENCOUNTER — Ambulatory Visit: Payer: Medicare Other | Admitting: Family Medicine

## 2022-02-09 LAB — CBC WITH DIFFERENTIAL/PLATELET
Basophils Absolute: 0.1 10*3/uL (ref 0.0–0.1)
Basophils Relative: 1.3 % (ref 0.0–3.0)
Eosinophils Absolute: 0.1 10*3/uL (ref 0.0–0.7)
Eosinophils Relative: 2.1 % (ref 0.0–5.0)
HCT: 39.5 % (ref 36.0–46.0)
Hemoglobin: 13.6 g/dL (ref 12.0–15.0)
Lymphocytes Relative: 42.7 % (ref 12.0–46.0)
Lymphs Abs: 2.3 10*3/uL (ref 0.7–4.0)
MCHC: 34.3 g/dL (ref 30.0–36.0)
MCV: 99.4 fl (ref 78.0–100.0)
Monocytes Absolute: 0.5 10*3/uL (ref 0.1–1.0)
Monocytes Relative: 9.6 % (ref 3.0–12.0)
Neutro Abs: 2.4 10*3/uL (ref 1.4–7.7)
Neutrophils Relative %: 44.3 % (ref 43.0–77.0)
Platelets: 173 10*3/uL (ref 150.0–400.0)
RBC: 3.98 Mil/uL (ref 3.87–5.11)
RDW: 13.5 % (ref 11.5–15.5)
WBC: 5.4 10*3/uL (ref 4.0–10.5)

## 2022-02-09 LAB — COMPREHENSIVE METABOLIC PANEL
ALT: 16 U/L (ref 0–35)
AST: 20 U/L (ref 0–37)
Albumin: 4.1 g/dL (ref 3.5–5.2)
Alkaline Phosphatase: 64 U/L (ref 39–117)
BUN: 14 mg/dL (ref 6–23)
CO2: 27 mEq/L (ref 19–32)
Calcium: 9.1 mg/dL (ref 8.4–10.5)
Chloride: 104 mEq/L (ref 96–112)
Creatinine, Ser: 1.12 mg/dL (ref 0.40–1.20)
GFR: 44.66 mL/min — ABNORMAL LOW (ref >60.00)
Glucose, Bld: 117 mg/dL — ABNORMAL HIGH (ref 70–99)
Potassium: 3.9 mEq/L (ref 3.5–5.1)
Sodium: 138 mEq/L (ref 135–145)
Total Bilirubin: 0.3 mg/dL (ref 0.2–1.2)
Total Protein: 7.2 g/dL (ref 6.0–8.3)

## 2022-02-09 LAB — C-REACTIVE PROTEIN: CRP: <1 mg/dL (ref 0.5–20.0)

## 2022-02-09 LAB — BRAIN NATRIURETIC PEPTIDE: Pro B Natriuretic peptide (BNP): 113 pg/mL — ABNORMAL HIGH (ref 0.0–100.0)

## 2022-02-09 LAB — TSH: TSH: 2.27 u[IU]/mL (ref 0.35–5.50)

## 2022-02-09 LAB — CK: Total CK: 43 U/L (ref 7–177)

## 2022-02-27 ENCOUNTER — Ambulatory Visit: Payer: Medicare Other | Attending: Surgery

## 2022-02-27 VITALS — Wt 179.4 lb

## 2022-02-27 DIAGNOSIS — Z483 Aftercare following surgery for neoplasm: Secondary | ICD-10-CM | POA: Insufficient documentation

## 2022-02-27 NOTE — Therapy (Signed)
OUTPATIENT PHYSICAL THERAPY SOZO SCREENING NOTE   Patient Name: [name] MRN: [id number] DOB:Mar 15, 1934, 88 y.o., female Today's Date: 02/27/2022  PCP: [name], MD REFERRING PROVIDER: Erroll Luna, MD   PT End of Session - 02/27/22 1544     Visit Number 1   # unchanged due to screen only   PT Start Time 1541    PT Stop Time 1546    PT Time Calculation (min) 5 min    Activity Tolerance Patient tolerated treatment well    Behavior During Therapy [facility] for tasks assessed/performed             Past Medical History:  Diagnosis Date   Abnormal results of liver function studies 05/15/2007   Allergy    Anal fissure 09/26/2007   Formatting of this note might be different from the original. Qualifier: Diagnosis of  By: Nils Pyle CMA (AAMA), Leisha   Anxiety    B12 deficiency 05/05/2020   Chicken pox    Essential tremor    of head, sometimes hands.  pt takes primidone to help the tremors-DR. MILLER-NEUROLOGIST IN [place].  PT WAS STARTED ON VALUIM MANY YEARS AGO FOR HER TREMORS--AND CONTINUES TO TAKE.   Fracture of humerus, proximal, left, closed 10/28/2011   pt has finished physical therapy-limited ROM reaching to her back and unable to lift heavy things with left hand/arm   GERD (gastroesophageal reflux disease)    H/O hiatal hernia    Hereditary and idiopathic peripheral neuropathy 03/27/2014   Hx of colonic polyps    Osteoarthritis    PAIN AND OA BILATERAL KNEES; ALSO HAS ARTHRITIS IN HANDS AND BACK BUT NO BACK PAIN   Other chronic otitis externa 03/01/2009   Formatting of this note might be different from the original. Qualifier: Diagnosis of  By: Arnoldo Morale MD, Balinda Quails   Past Surgical History:  Procedure Laterality Date   arthroscopy left knee     arthroscopy right knee     bmp  07/17/2000   BREAST BIOPSY Right 03/23/2021   x2   BREAST LUMPECTOMY WITH RADIOACTIVE SEED AND SENTINEL LYMPH NODE BIOPSY Right 04/20/2021   Procedure: RIGHT BREAST LUMPECTOMY  WITH RADIOACTIVE SEED AND SENTINEL LYMPH NODE BIOPSY;  Surgeon: Erroll Luna, MD;  Location: [facility];  Service: General;  Laterality: Right;   CATARACT EXTRACTION, BILATERAL     COLONOSCOPY W/ POLYPECTOMY     DILATION AND CURETTAGE OF UTERUS     RIGHT FOOT SURGERIES X 2     TOTAL KNEE ARTHROPLASTY  03/13/2012   Procedure: TOTAL KNEE ARTHROPLASTY;  Surgeon: Gearlean Alf, MD;  [facility];  Service: Orthopedics;  Laterality: Left;  steroid injection right knee   TOTAL KNEE ARTHROPLASTY Right 08/04/2013   Procedure: RIGHT TOTAL KNEE ARTHROPLASTY;  Surgeon: Gearlean Alf, MD;  [facility];  Service: Orthopedics;  Laterality: Right;   Patient Active Problem List   Diagnosis Date Noted   History of breast cancer, right 08/12/2021   Adrenal nodule- likely benign adenoma ([place]) 04/12/2021   Aortic atherosclerosis ([place]) 04/12/2021   Malignant neoplasm of upper-outer quadrant of right breast in female, estrogen receptor positive ([place]) 03/28/2021   Hyperlipidemia 03/18/2021   Invasive ductal carcinoma of breast, female, right ([place]) 02/28/2021   Vitamin D deficiency 08/05/2019   Elevated blood pressure reading without diagnosis of hypertension 04/30/2019   Allergic rhinitis 10/28/2018   OSA (obstructive sleep apnea) 11/28/2017   Iron overload 02/21/2016   Senile nuclear sclerosis 12/10/2014  Gait difficulty 03/27/2014   Osteopenia 10/31/2012   Gastro-esophageal reflux disease without esophagitis 06/25/2012   Fecal incontinence 06/25/2012   Thoracic radiculitis 07/20/2010   Diverticulosis of colon 09/26/2007   Depression with anxiety 09/26/2007   Enlarged lymph nodes 07/26/2007   History of colonic polyps 05/15/2007   Irritable bowel syndrome 05/15/2007   Tremor, essential 02/11/2007   Osteoarthritis of both hands 02/11/2007    REFERRING DIAG: right breast cancer at risk for lymphedema  THERAPY DIAG: Aftercare following surgery for neoplasm  PERTINENT  HISTORY: Patient was diagnosed on 02/28/2021 with right grade II invasive ductal carcinoma breast cancer. She underwent a right lumpectomy and sentinel node biopsy (5 negative nodes) on 04/20/2021. It is ER/PR positive and HER2 negative with a Ki67 of 1%. She had bilateral knee replacements 10 years ago.   PRECAUTIONS: right UE Lymphedema risk, None  SUBJECTIVE: Pt returns for her 3 month L-Dex screen.   PAIN:  Are you having pain? No  SOZO SCREENING: Patient was assessed today using the SOZO machine to determine the lymphedema index score. This was compared to her baseline score. It was determined that she is within the recommended range when compared to her baseline and no further action is needed at this time. She will continue SOZO screenings. These are done every 3 months for 2 years post operatively followed by every 6 months for 2 years, and then annually.   L-DEX FLOWSHEETS - 02/27/22 1500       L-DEX LYMPHEDEMA SCREENING   Measurement Type Unilateral    L-DEX MEASUREMENT EXTREMITY Upper Extremity    POSITION  Standing    DOMINANT SIDE Right    At Risk Side Right    BASELINE SCORE (UNILATERAL) 3.9    L-DEX SCORE (UNILATERAL) 4.7    VALUE CHANGE (UNILAT) 0.8              Otelia Limes, PTA 02/27/2022, 3:45 PM

## 2022-03-09 ENCOUNTER — Encounter: Payer: Self-pay | Admitting: Internal Medicine

## 2022-03-09 ENCOUNTER — Ambulatory Visit (INDEPENDENT_AMBULATORY_CARE_PROVIDER_SITE_OTHER): Payer: Medicare Other | Admitting: Internal Medicine

## 2022-03-09 VITALS — BP 120/70 | HR 73 | Ht 64.0 in | Wt 181.2 lb

## 2022-03-09 DIAGNOSIS — G4733 Obstructive sleep apnea (adult) (pediatric): Secondary | ICD-10-CM

## 2022-03-09 DIAGNOSIS — R053 Chronic cough: Secondary | ICD-10-CM

## 2022-03-09 DIAGNOSIS — R5383 Other fatigue: Secondary | ICD-10-CM

## 2022-03-09 MED ORDER — MONTELUKAST SODIUM 10 MG PO TABS: 10.0000 mg | ORAL_TABLET | Freq: Every day | ORAL | 11 refills | Status: DC

## 2022-03-09 MED ORDER — LORATADINE 10 MG PO TABS
10.0000 mg | ORAL_TABLET | Freq: Every day | ORAL | 11 refills | Status: DC
Start: 2022-03-09 — End: 2022-05-23

## 2022-03-09 NOTE — Progress Notes (Signed)
RETAL Mora    [id number]    [birth year]  Primary Care Physician:[name], Krista Boxer, MD  Referring Physician: [name], MD [place],  [place] [address] Reason for Consultation: fatigue and cough Date of Consultation: 03/09/2022  Chief complaint:   Chief Complaint  Patient presents with   Consult    Consult: Long haul covid pt,      HPI: [name] is a 88 y.o. woman with history of breast cancer s/p lumpectomy who presents for new patient evaluation of fatigue and cough. She also has a history of OSA  She had covid in May 2023 and has been feeling badly since there. She has a chronic dry cough. She feels like she "swallowed feathers." She has been woken up for nocturia but not related to cough. No relation to food. She denies frequent throat clearing. She does have seasonal allergies and takes zyrtec as needed. She has dry eyes and uses refresh eye drops. She has also had cataract surgery and uses drops for this.   Cough is not worse when she is exerting herself.  She does have reflux and has a hiatal hernia and takes prilosec 20 mg once daily  Also has essential tremor and takes primidone and valium for her essential tremor.   She does have sleep apnea and was told to wear a cpap machine. She couldn't tolerate the cpap machine. Thinks her sleep study was 8 years ago (about.) She couldn't tolerate nasal pillows due to mouth breathing.   She does have a history of childhood allergies and rhinitis.    Social history:  Occupation: retired from being a Customer service manager Exposures: live alone, has a Neurosurgeon since May 2023. She denies a known cat allergy. Smoking history: never smoker, passive smoke exposure in childhood.   Social History   Occupational History   Not on file  Tobacco Use   Smoking status: Never   Smokeless tobacco: Never  Substance and Sexual Activity   Alcohol use: Yes    Comment: Rare   Drug use: No   Sexual activity: Not  Currently    Relevant family history:  Family History  Problem Relation Age of Onset   Stroke Mother    Kidney disease Father    Cancer Maternal Grandmother        Leukemia   Kidney failure Other        renal disease    Past Medical History:  Diagnosis Date   Abnormal results of liver function studies 05/15/2007   Allergy    Anal fissure 09/26/2007   Formatting of this note might be different from the original. Qualifier: Diagnosis of  By: Nils Pyle CMA (AAMA), Leisha   Anxiety    B12 deficiency 05/05/2020   Chicken pox    Essential tremor    of head, sometimes hands.  pt takes primidone to help the tremors-DR. MILLER-NEUROLOGIST IN [place].  PT WAS STARTED ON VALUIM MANY YEARS AGO FOR HER TREMORS--AND CONTINUES TO TAKE.   Fracture of humerus, proximal, left, closed 10/28/2011   pt has finished physical therapy-limited ROM reaching to her back and unable to lift heavy things with left hand/arm   GERD (gastroesophageal reflux disease)    H/O hiatal hernia    Hereditary and idiopathic peripheral neuropathy 03/27/2014   Hx of colonic polyps    Osteoarthritis    PAIN AND OA BILATERAL KNEES; ALSO HAS ARTHRITIS IN HANDS AND BACK BUT NO  BACK PAIN   Other chronic otitis externa 03/01/2009   Formatting of this note might be different from the original. Qualifier: Diagnosis of  By: Arnoldo Morale MD, Balinda Quails    Past Surgical History:  Procedure Laterality Date   arthroscopy left knee     arthroscopy right knee     bmp  07/17/2000   BREAST BIOPSY Right 03/23/2021   x2   BREAST LUMPECTOMY WITH RADIOACTIVE SEED AND SENTINEL LYMPH NODE BIOPSY Right 04/20/2021   Procedure: RIGHT BREAST LUMPECTOMY WITH RADIOACTIVE SEED AND SENTINEL LYMPH NODE BIOPSY;  Surgeon: Erroll Luna, MD;  Location: [facility];  Service: General;  Laterality: Right;   CATARACT EXTRACTION, BILATERAL     COLONOSCOPY W/ POLYPECTOMY     DILATION AND CURETTAGE OF UTERUS     RIGHT FOOT SURGERIES X 2      TOTAL KNEE ARTHROPLASTY  03/13/2012   Procedure: TOTAL KNEE ARTHROPLASTY;  Surgeon: Gearlean Alf, MD;  [facility];  Service: Orthopedics;  Laterality: Left;  steroid injection right knee   TOTAL KNEE ARTHROPLASTY Right 08/04/2013   Procedure: RIGHT TOTAL KNEE ARTHROPLASTY;  Surgeon: Gearlean Alf, MD;  [facility];  Service: Orthopedics;  Laterality: Right;     Physical Exam: Blood pressure 120/70, pulse 73, height '5\' 4"'$  (1.626 m), weight 181 lb 3.2 oz (82.2 kg), SpO2 93 %. Gen:      No acute distress ENT:  +cobblestoning in oropharynx no nasal polyps, mucus membranes moist Lungs:    No increased respiratory effort, symmetric chest wall excursion, clear to auscultation bilaterally, no wheezes or crackles CV:         Regular rate and rhythm; no murmurs, rubs, or gallops.  No pedal edema Abd:      + bowel sounds; soft, non-tender; no distension MSK: no acute synovitis of DIP or PIP joints, no mechanics hands.  Skin:      Warm and dry; no rashes Neuro: normal speech, no focal facial asymmetry, essential tremor Psych: alert and oriented x3, normal mood and affect   Data Reviewed/Medical Decision Making:  Independent interpretation of tests: Imaging:   PFTs:  Labs:  Lab Results  Component Value Date   WBC 5.4 02/08/2022   HGB 13.6 02/08/2022   HCT 39.5 02/08/2022   MCV 99.4 02/08/2022   PLT 173.0 02/08/2022   Lab Results  Component Value Date   NA 138 02/08/2022   K 3.9 02/08/2022   CL 104 02/08/2022   CO2 27 02/08/2022   BNP 113 TSH 2.27   Immunization status:  Immunization History  Administered Date(s) Administered   Fluad Quad(high Dose 65+) 04/30/2019, 04/30/2020, 05/12/2021   Influenza Whole 05/15/2007, 06/02/2009   Influenza, High Dose Seasonal PF 08/13/2017, 06/17/2018   PFIZER(Purple Top)SARS-COV-2 Vaccination 10/02/2019, 10/27/2019   Pneumococcal Conjugate-13 03/18/2015   Pneumococcal Polysaccharide-23 06/25/2012   Td 07/17/1992     I  reviewed prior external note(s) from PCP  I reviewed the result(s) of the labs and imaging as noted above.   I have ordered ONO   Assessment:  Fatigue Chronic Cough - like  Long Covid symptoms  Plan/Recommendations: I think your cough is related to drainage from allergies.  Start taking singulair allergy pill once a day in the morning Start taking claritin anti-histamine once a day at night.   Since wearing CPAP is difficult for you, we can see if we can improve your sleep quality by determining if your oxygen levels drop at night. If they do  we can prescribe nighttime oxygen to see if this helps along some of your fatigue. Untreated sleep apnea can certainly make symptoms of fatigue after covid worse.    Return to Care: Return in about 2 months (around 05/09/2022).  Lenice Llamas, MD Pulmonary and [place]  CC: Gena Fray Krista Boxer, MD

## 2022-03-09 NOTE — Patient Instructions (Signed)
Please schedule follow up scheduled with myself in 2 months.  If my schedule is not open yet, we will contact you with a reminder closer to that time. Please call [phone] if you haven't heard from [place] a month before.   Before your next visit I would like you to have: Overnight oximetry test - we will schedule for you at home   I think your cough is related to drainage from allergies.  Start taking singulair allergy pill once a day in the morning Start taking claritin anti-histamine once a day at night.   Since wearing CPAP is difficult for you, we can see if we can improve your sleep quality by determining if your oxygen levels drop at night. If they do we can prescribe nighttime oxygen to see if this helps along some of your fatigue. Untreated sleep apnea can certainly make symptoms of fatigue after covid worse.

## 2022-03-22 ENCOUNTER — Ambulatory Visit (INDEPENDENT_AMBULATORY_CARE_PROVIDER_SITE_OTHER): Payer: Medicare Other | Admitting: Family Medicine

## 2022-03-22 ENCOUNTER — Ambulatory Visit (INDEPENDENT_AMBULATORY_CARE_PROVIDER_SITE_OTHER): Payer: Medicare Other

## 2022-03-22 VITALS — BP 124/72 | HR 68 | Temp 97.1°F | Ht 64.0 in | Wt 178.8 lb

## 2022-03-22 DIAGNOSIS — R0789 Other chest pain: Secondary | ICD-10-CM | POA: Diagnosis not present

## 2022-03-22 DIAGNOSIS — R079 Chest pain, unspecified: Secondary | ICD-10-CM | POA: Diagnosis not present

## 2022-03-22 DIAGNOSIS — R42 Dizziness and giddiness: Secondary | ICD-10-CM | POA: Diagnosis not present

## 2022-03-22 DIAGNOSIS — M19012 Primary osteoarthritis, left shoulder: Secondary | ICD-10-CM | POA: Diagnosis not present

## 2022-03-22 NOTE — Progress Notes (Unsigned)
Initial visit for left chest wall pain x 1week.  Denies injury BB marked by patient  where the pain seems to be worse

## 2022-03-22 NOTE — Progress Notes (Signed)
[facility] [address] [place] [address] Dept: [phone] Dept Fax: [phone]  Office Visit  Subjective:    Patient ID: [name], female    DOB: 09/15/1933, 88 y.o..   MRN: [id number]  Chief Complaint  Patient presents with   Acute Visit    C/o having pain on the LT side rib x 1 week. Has taken Aleve with some relief.  Also having some dizzy spells.     History of Present Illness:  Patient is in today complaining of a one week history of left chest wall pain. She notes the pain runs form her back around her left side over the lower ribs. She describes it as variably sharp and aching. She has not seen any rash. She did have zoster some years ago. She does not notes increased pain with breathing. She notes it is worse when lying down. She has had to stop wearing her usual bra, as the underwire hurts this area. She denies any increased pain with breathing. She is not running fever.  Additionally, Ms. Grimm complains of episodes of vertigo. These are not consistently positional. She feels a bit "swimmy headed" at times. She has a history of an essential tremor and notes this seems to worsen in times of stress. She takes 5 mg of Valium three times a day to manage this and has been on a consistent stable dose for quite some time.  Past Medical History: Patient Active Problem List   Diagnosis Date Noted   History of breast cancer, right 08/12/2021   Adrenal nodule- likely benign adenoma ([place]) 04/12/2021   Aortic atherosclerosis ([place]) 04/12/2021   Malignant neoplasm of upper-outer quadrant of right breast in female, estrogen receptor positive ([place]) 03/28/2021   Hyperlipidemia 03/18/2021   Invasive ductal carcinoma of breast, female, right ([place]) 02/28/2021   Vitamin D deficiency 08/05/2019   Elevated blood pressure reading without diagnosis of hypertension 04/30/2019   Allergic rhinitis 10/28/2018   OSA (obstructive  sleep apnea) 11/28/2017   Iron overload 02/21/2016   Senile nuclear sclerosis 12/10/2014   Gait difficulty 03/27/2014   Osteopenia 10/31/2012   Gastro-esophageal reflux disease without esophagitis 06/25/2012   Fecal incontinence 06/25/2012   Thoracic radiculitis 07/20/2010   Diverticulosis of colon 09/26/2007   Depression with anxiety 09/26/2007   Enlarged lymph nodes 07/26/2007   History of colonic polyps 05/15/2007   Irritable bowel syndrome 05/15/2007   Tremor, essential 02/11/2007   Osteoarthritis of both hands 02/11/2007   Past Surgical History:  Procedure Laterality Date   arthroscopy left knee     arthroscopy right knee     bmp  07/17/2000   BREAST BIOPSY Right 03/23/2021   x2   BREAST LUMPECTOMY WITH RADIOACTIVE SEED AND SENTINEL LYMPH NODE BIOPSY Right 04/20/2021   Procedure: RIGHT BREAST LUMPECTOMY WITH RADIOACTIVE SEED AND SENTINEL LYMPH NODE BIOPSY;  Surgeon: Erroll Luna, MD;  Location: [facility];  Service: General;  Laterality: Right;   CATARACT EXTRACTION, BILATERAL     COLONOSCOPY W/ POLYPECTOMY     DILATION AND CURETTAGE OF UTERUS     RIGHT FOOT SURGERIES X 2     TOTAL KNEE ARTHROPLASTY  03/13/2012   Procedure: TOTAL KNEE ARTHROPLASTY;  Surgeon: Gearlean Alf, MD;  Location: [facility];  Service: Orthopedics;  Laterality: Left;  steroid injection right knee   TOTAL KNEE ARTHROPLASTY Right 08/04/2013   Procedure: RIGHT TOTAL KNEE ARTHROPLASTY;  Surgeon: Gearlean Alf, MD;  Location: [facility];  Service: Orthopedics;  Laterality: Right;   Family History  Problem Relation Age of Onset   Stroke Mother    Kidney disease Father    Cancer Maternal Grandmother        Leukemia   Kidney failure Other        renal disease   Outpatient Medications Prior to Visit  Medication Sig Dispense Refill   cholecalciferol (VITAMIN D3) 25 MCG (1000 UNIT) tablet Take 1,000 Units by mouth daily.     diazepam (VALIUM) 5 MG tablet TAKE 1 TABLET(5 MG) BY MOUTH EVERY 8  HOURS AS NEEDED FOR ANXIETY 90 tablet 3   hyoscyamine (LEVSIN) 0.125 MG tablet Take 1 tablet (0.125 mg total) by mouth every 4 (four) hours as needed for cramping. 60 tablet 1   loratadine (CLARITIN) 10 MG tablet Take 1 tablet (10 mg total) by mouth daily. 30 tablet 11   montelukast (SINGULAIR) 10 MG tablet Take 1 tablet (10 mg total) by mouth at bedtime. 30 tablet 11   Multiple Vitamin (MULTIVITAMIN) tablet Take 1 tablet by mouth daily.     omeprazole (PRILOSEC) 20 MG capsule Take 1 capsule (20 mg total) by mouth daily. 90 capsule 3   primidone (MYSOLINE) 250 MG tablet Take 1 tablet (250 mg total) by mouth 3 (three) times daily. 270 tablet 3   No facility-administered medications prior to visit.   Allergies  Allergen Reactions   Memantine Swelling   Propranolol Other (See Comments)    Made her head feel very funny Made her head feel very funny    Codeine Nausea And Vomiting   Sulfamethoxazole Rash    REACTION: unspecified     Objective:   Today's Vitals   03/22/22 1318  BP: 124/72  Pulse: 68  Temp: (!) 97.1 F (36.2 C)  TempSrc: Temporal  SpO2: 95%  Weight: 178 lb 12.8 oz (81.1 kg)  Height: '5\' 4"'$  (1.626 m)   Body mass index is 30.69 kg/m.   General: Well developed, well nourished. No acute distress. HEENT: Normocephalic, non-traumatic. PERRL, EOMI. Conjunctiva clear. No nystagmus.  Lungs: Clear to auscultation bilaterally. No wheezing, rales or rhonchi. CV: RRR without murmurs or rubs. Pulses 2+ bilaterally. Chest: There is tenderness, even to lighter palpation along the lower ribs at about the 8th or 9th rib level. There   is no visible rash present, though possibly some redness. Psych: Alert and oriented. Normal mood and affect.  Health Maintenance Due  Topic Date Due   Zoster Vaccines- Shingrix (1 of 2) Never done   TETANUS/TDAP  07/17/2002   INFLUENZA VACCINE  02/14/2022   Imaging: Chest x-ray: Normal. No acute infiltrates. Left Rib X-ray: No visible  fracture.  Assessment & Plan:   1. Left-sided chest wall pain The etiology at this point is unclear. We discussed her using heat and continuing to take Aleve to help with this. We did discuss that this could be a prodrome to a recurrence fo shingles. Should she develop a rash, I urged her to reach out to me.  - DG Chest 2 View - DG Ribs Unilateral Left  2. Vertigo The etiology of this is unclear. Her pulse and BP are normal. This could relate to her Valium use, but her dose has been stable. She is due for follow up with Dr. Dianna Rossetti (neurology). I recommend she discuss this further with him.  Return if symptoms worsen or fail to improve.   [name], MD

## 2022-03-22 NOTE — Patient Instructions (Signed)
Apply heat tot he chest wall as needed. Continue Aleve 220 mg twice a day. Contact Dr. Gena Fray if rash develops along site of pain.

## 2022-04-04 ENCOUNTER — Telehealth: Payer: Self-pay | Admitting: Family Medicine

## 2022-04-04 DIAGNOSIS — B0229 Other postherpetic nervous system involvement: Secondary | ICD-10-CM

## 2022-04-04 NOTE — Telephone Encounter (Signed)
Patient states that the pain is under her Left breast and feels like bee stings.  Can this be internal shingles pain?

## 2022-04-04 NOTE — Telephone Encounter (Signed)
Pt is wanting to know if she could have shingles on the inside. Please advise pt @ [phone]

## 2022-04-05 DIAGNOSIS — B0229 Other postherpetic nervous system involvement: Secondary | ICD-10-CM | POA: Insufficient documentation

## 2022-04-05 MED ORDER — GABAPENTIN 300 MG PO CAPS: ORAL_CAPSULE | ORAL | 3 refills | Status: DC

## 2022-04-05 NOTE — Telephone Encounter (Signed)
Spoke to patient she has been taking Aleve but makes her sleepy.  Is there anything else that she can take? Please review and advise.  Thanks.  Dm/cma

## 2022-04-05 NOTE — Addendum Note (Signed)
Addended by: [name] on: 04/05/2022 01:01 PM   Modules accepted: Orders

## 2022-04-05 NOTE — Telephone Encounter (Signed)
Patient notified VIA phone. Dm/cma  

## 2022-04-10 ENCOUNTER — Encounter: Payer: Self-pay | Admitting: Family Medicine

## 2022-04-10 ENCOUNTER — Ambulatory Visit (INDEPENDENT_AMBULATORY_CARE_PROVIDER_SITE_OTHER): Payer: Medicare Other | Admitting: Family Medicine

## 2022-04-10 VITALS — BP 132/60 | HR 80 | Temp 97.3°F | Wt 183.4 lb

## 2022-04-10 DIAGNOSIS — M5414 Radiculopathy, thoracic region: Secondary | ICD-10-CM

## 2022-04-10 DIAGNOSIS — R42 Dizziness and giddiness: Secondary | ICD-10-CM | POA: Diagnosis not present

## 2022-04-10 DIAGNOSIS — G250 Essential tremor: Secondary | ICD-10-CM | POA: Diagnosis not present

## 2022-04-10 DIAGNOSIS — Z23 Encounter for immunization: Secondary | ICD-10-CM

## 2022-04-10 MED ORDER — CAPSAICIN 0.025 % EX LOTN: TOPICAL_LOTION | CUTANEOUS | 1 refills | Status: DC

## 2022-04-10 NOTE — Progress Notes (Signed)
[facility] [address] [place] [address] Dept: [phone] Dept Fax: [phone]  Office Visit  Subjective:    Patient ID: [name], female    DOB: January 01, 1934, 88 y.o..   MRN: [id number]  Chief Complaint  Patient presents with   Acute Visit    Med reaction to med prescribe, for internal shingles. Symptoms : made her feel drunk.    History of Present Illness:  Patient is in today complaining of ongoing pain in the left chest wall. I had seen [name] with this on 9/6, at which time, she had had pain for about 3 weeks. She was using aleve, but did not find this to be very helpful. She contacted me since the appointment. I had her try taking some gabapentin 300 mg at bedtime, to see if this would help. She noted that the gabapentin made her feel drunk and caused her to stagger more. She has since stopped this. She notes the pain has shifted from being more in the back to around under the left breast. She does note this to feel like pins and needles.   Additionally, [name] continues to complain of episodes of vertigo. These are not consistently positional. She feels a bit "swimmy headed" at times. She has a history of an essential tremor and notes this seems to worsen in times of stress. She takes 5 mg of Valium three times a day to manage this and has been on a consistent stable dose for quite some time. She has seen a neurologist (Dr. Dianna Rossetti) int he past, but has not been back to him in some time.  Past Medical History: Patient Active Problem List   Diagnosis Date Noted   Postherpetic neuralgia 04/05/2022   History of breast cancer, right 08/12/2021   Adrenal nodule- likely benign adenoma ([place]) 04/12/2021   Aortic atherosclerosis ([place]) 04/12/2021   Malignant neoplasm of upper-outer quadrant of right breast in female, estrogen receptor positive ([place]) 03/28/2021   Hyperlipidemia 03/18/2021   Invasive ductal  carcinoma of breast, female, right ([place]) 02/28/2021   Vitamin D deficiency 08/05/2019   Elevated blood pressure reading without diagnosis of hypertension 04/30/2019   Allergic rhinitis 10/28/2018   OSA (obstructive sleep apnea) 11/28/2017   Iron overload 02/21/2016   Senile nuclear sclerosis 12/10/2014   Gait difficulty 03/27/2014   Osteopenia 10/31/2012   Gastro-esophageal reflux disease without esophagitis 06/25/2012   Fecal incontinence 06/25/2012   Thoracic radiculitis 07/20/2010   Diverticulosis of colon 09/26/2007   Depression with anxiety 09/26/2007   Enlarged lymph nodes 07/26/2007   History of colonic polyps 05/15/2007   Irritable bowel syndrome 05/15/2007   Tremor, essential 02/11/2007   Osteoarthritis of both hands 02/11/2007   Past Surgical History:  Procedure Laterality Date   arthroscopy left knee     arthroscopy right knee     bmp  07/17/2000   BREAST BIOPSY Right 03/23/2021   x2   BREAST LUMPECTOMY WITH RADIOACTIVE SEED AND SENTINEL LYMPH NODE BIOPSY Right 04/20/2021   Procedure: RIGHT BREAST LUMPECTOMY WITH RADIOACTIVE SEED AND SENTINEL LYMPH NODE BIOPSY;  Surgeon: Erroll Luna, MD;  Location: [facility];  Service: General;  Laterality: Right;   CATARACT EXTRACTION, BILATERAL     COLONOSCOPY W/ POLYPECTOMY     DILATION AND CURETTAGE OF UTERUS     RIGHT FOOT SURGERIES X 2     TOTAL KNEE ARTHROPLASTY  03/13/2012   Procedure: TOTAL KNEE ARTHROPLASTY;  Surgeon: Dione Plover  Aluisio, MD;  [facility];  Service: Orthopedics;  Laterality: Left;  steroid injection right knee   TOTAL KNEE ARTHROPLASTY Right 08/04/2013   Procedure: RIGHT TOTAL KNEE ARTHROPLASTY;  Surgeon: Gearlean Alf, MD;  [facility];  Service: Orthopedics;  Laterality: Right;   Family History  Problem Relation Age of Onset   Stroke Mother    Kidney disease Father    Cancer Maternal Grandmother        Leukemia   Kidney failure Other        renal disease   Outpatient  Medications Prior to Visit  Medication Sig Dispense Refill   cholecalciferol (VITAMIN D3) 25 MCG (1000 UNIT) tablet Take 1,000 Units by mouth daily.     diazepam (VALIUM) 5 MG tablet TAKE 1 TABLET(5 MG) BY MOUTH EVERY 8 HOURS AS NEEDED FOR ANXIETY 90 tablet 3   hyoscyamine (LEVSIN) 0.125 MG tablet Take 1 tablet (0.125 mg total) by mouth every 4 (four) hours as needed for cramping. 60 tablet 1   loratadine (CLARITIN) 10 MG tablet Take 1 tablet (10 mg total) by mouth daily. 30 tablet 11   montelukast (SINGULAIR) 10 MG tablet Take 1 tablet (10 mg total) by mouth at bedtime. 30 tablet 11   Multiple Vitamin (MULTIVITAMIN) tablet Take 1 tablet by mouth daily.     omeprazole (PRILOSEC) 20 MG capsule Take 1 capsule (20 mg total) by mouth daily. 90 capsule 3   primidone (MYSOLINE) 250 MG tablet Take 1 tablet (250 mg total) by mouth 3 (three) times daily. 270 tablet 3   gabapentin (NEURONTIN) 300 MG capsule Take 1 capsule (300 mg total) by mouth at bedtime for 1 day, THEN 1 capsule (300 mg total) 2 (two) times daily for 1 day, THEN 1 capsule (300 mg total) 3 (three) times daily for 28 days. (Patient not taking: Reported on 04/10/2022) 90 capsule 3   No facility-administered medications prior to visit.   Allergies  Allergen Reactions   Memantine Swelling   Propranolol Other (See Comments)    Made her head feel very funny Made her head feel very funny    Codeine Nausea And Vomiting   Gabapentin (Once-Daily)     Made her feel funny.   Sulfamethoxazole Rash    REACTION: unspecified    Objective:   Today's Vitals   04/10/22 1417  BP: 132/60  Pulse: 80  Temp: (!) 97.3 F (36.3 C)  TempSrc: Temporal  SpO2: 94%  Weight: 183 lb 6.4 oz (83.2 kg)   Body mass index is 31.48 kg/m.   General: Well developed, well nourished. No acute distress. Skin: Patient has some increased pain with even light touch.  Health Maintenance Due  Topic Date Due   Zoster Vaccines- Shingrix (1 of 2) Never done    TETANUS/TDAP  07/17/2002   COVID-19 Vaccine (3 - Pfizer risk series) 11/24/2019   INFLUENZA VACCINE  02/14/2022     Assessment & Plan:   1. Postherpetic neuralgia [name] is having pain with allodynia along a left T7/8 distribution. I was concerned she might be having a prodrome of zoster, but she has never had a rash develop. The etiology of the neuritis is uncertain at this point. We will try using capsaicin cream to see if we can manage this better.  - Capsaicin 0.025 % LOTN; Apply topically to affected area up to four times a day.  Dispense: 120 mL; Refill: 1  2. Tremor, essential On Valium 5 mg TID. She is due  for follow-up with neurology, so I encouraged her to call for an appointment.  3. Vertigo When seeing neurology, I recommend she have the vertigo further evaluated.  4. Need for influenza vaccination  - Flu Vaccine QUAD High Dose(Fluad)   Return in about 2 weeks (around 04/24/2022) for Reassessment.   [name], MD

## 2022-04-10 NOTE — Patient Instructions (Signed)
Schedule follow-up appointment with neurologist.

## 2022-04-24 ENCOUNTER — Other Ambulatory Visit: Payer: Self-pay | Admitting: Family Medicine

## 2022-04-24 ENCOUNTER — Ambulatory Visit: Payer: Medicare Other | Admitting: Family Medicine

## 2022-04-24 DIAGNOSIS — G250 Essential tremor: Secondary | ICD-10-CM

## 2022-04-24 NOTE — Telephone Encounter (Signed)
Will fill at appt on 04/25/22. Dm/cma

## 2022-04-25 ENCOUNTER — Ambulatory Visit (INDEPENDENT_AMBULATORY_CARE_PROVIDER_SITE_OTHER): Payer: Medicare Other | Admitting: Family Medicine

## 2022-04-25 ENCOUNTER — Encounter: Payer: Self-pay | Admitting: Family Medicine

## 2022-04-25 VITALS — BP 120/66 | HR 84 | Temp 98.1°F | Ht 64.0 in | Wt 182.0 lb

## 2022-04-25 DIAGNOSIS — M5414 Radiculopathy, thoracic region: Secondary | ICD-10-CM

## 2022-04-25 MED ORDER — AMITRIPTYLINE HCL 25 MG PO TABS: 25.0000 mg | ORAL_TABLET | Freq: Every day | ORAL | 1 refills | Status: DC

## 2022-04-25 NOTE — Progress Notes (Signed)
[facility] [address] [place] [address] Dept: [phone] Dept Fax: [phone]  Office Visit  Subjective:    Patient ID: [name], female    DOB: [birth year], 88 y.o..   MRN: [id number]  Chief Complaint  Patient presents with   Follow-up    2 week f/u.  Reports  not feeling better.  Has been using CBD cream with relief.      History of Present Illness:  Patient is in today for reassessment of her left chest wall pain. I have seen [name] twice over the past month related to ongoing pain in the left chest wall. I had seen [name] with this on 9/6, at which time, she had had pain for about 3 weeks. She was using Aleve, but did not find this to be very helpful. We then tried gabapentin 300 mg at bedtime, to see if this would help. She noted that the gabapentin made her feel drunk and caused her to stagger more. She has since stopped this. The pain has shifted from being more in the back to around under the left breast. She does note this to feel like pins and needles. She never had an associated rash. I next tried her on capsaicin, but she found the burning sensation to the skin to be too intense. She is now using a hemp ointment and is back to using Aleve for this.  Past Medical History: Patient Active Problem List   Diagnosis Date Noted   Postherpetic neuralgia 04/05/2022   History of breast cancer, right 08/12/2021   Adrenal nodule- likely benign adenoma ([place]) 04/12/2021   Aortic atherosclerosis ([place]) 04/12/2021   Malignant neoplasm of upper-outer quadrant of right breast in female, estrogen receptor positive ([place]) 03/28/2021   Hyperlipidemia 03/18/2021   Invasive ductal carcinoma of breast, female, right ([place]) 02/28/2021   Vitamin D deficiency 08/05/2019   Elevated blood pressure reading without diagnosis of hypertension 04/30/2019   Allergic rhinitis 10/28/2018   OSA (obstructive sleep apnea) 11/28/2017    Iron overload 02/21/2016   Senile nuclear sclerosis 12/10/2014   Gait difficulty 03/27/2014   Osteopenia 10/31/2012   Gastro-esophageal reflux disease without esophagitis 06/25/2012   Fecal incontinence 06/25/2012   Thoracic radiculitis 07/20/2010   Diverticulosis of colon 09/26/2007   Depression with anxiety 09/26/2007   Enlarged lymph nodes 07/26/2007   History of colonic polyps 05/15/2007   Irritable bowel syndrome 05/15/2007   Tremor, essential 02/11/2007   Osteoarthritis of both hands 02/11/2007   Past Surgical History:  Procedure Laterality Date   arthroscopy left knee     arthroscopy right knee     bmp  07/17/2000   BREAST BIOPSY Right 03/23/2021   x2   BREAST LUMPECTOMY WITH RADIOACTIVE SEED AND SENTINEL LYMPH NODE BIOPSY Right 04/20/2021   Procedure: RIGHT BREAST LUMPECTOMY WITH RADIOACTIVE SEED AND SENTINEL LYMPH NODE BIOPSY;  Surgeon: Erroll Luna, MD;  Location: [facility];  Service: General;  Laterality: Right;   CATARACT EXTRACTION, BILATERAL     COLONOSCOPY W/ POLYPECTOMY     DILATION AND CURETTAGE OF UTERUS     RIGHT FOOT SURGERIES X 2     TOTAL KNEE ARTHROPLASTY  03/13/2012   Procedure: TOTAL KNEE ARTHROPLASTY;  Surgeon: Gearlean Alf, MD;  [facility];  Service: Orthopedics;  Laterality: Left;  steroid injection right knee   TOTAL KNEE ARTHROPLASTY Right 08/04/2013   Procedure: RIGHT TOTAL KNEE ARTHROPLASTY;  Surgeon: Gearlean Alf, MD;  [facility];  Service: Orthopedics;  Laterality: Right;   Family History  Problem Relation Age of Onset   Stroke Mother    Kidney disease Father    Cancer Maternal Grandmother        Leukemia   Kidney failure Other        renal disease   Outpatient Medications Prior to Visit  Medication Sig Dispense Refill   cholecalciferol (VITAMIN D3) 25 MCG (1000 UNIT) tablet Take 1,000 Units by mouth daily.     diazepam (VALIUM) 5 MG tablet TAKE 1 TABLET(5 MG) BY MOUTH EVERY 8 HOURS AS NEEDED FOR  ANXIETY 90 tablet 1   hyoscyamine (LEVSIN) 0.125 MG tablet Take 1 tablet (0.125 mg total) by mouth every 4 (four) hours as needed for cramping. 60 tablet 1   loratadine (CLARITIN) 10 MG tablet Take 1 tablet (10 mg total) by mouth daily. 30 tablet 11   montelukast (SINGULAIR) 10 MG tablet Take 1 tablet (10 mg total) by mouth at bedtime. 30 tablet 11   Multiple Vitamin (MULTIVITAMIN) tablet Take 1 tablet by mouth daily.     omeprazole (PRILOSEC) 20 MG capsule Take 1 capsule (20 mg total) by mouth daily. 90 capsule 3   primidone (MYSOLINE) 250 MG tablet Take 1 tablet (250 mg total) by mouth 3 (three) times daily. 270 tablet 3   Capsaicin 0.025 % LOTN Apply topically to affected area up to four times a day. (Patient not taking: Reported on 04/25/2022) 120 mL 1   gabapentin (NEURONTIN) 300 MG capsule Take 1 capsule (300 mg total) by mouth at bedtime for 1 day, THEN 1 capsule (300 mg total) 2 (two) times daily for 1 day, THEN 1 capsule (300 mg total) 3 (three) times daily for 28 days. (Patient not taking: Reported on 04/10/2022) 90 capsule 3   No facility-administered medications prior to visit.   Allergies  Allergen Reactions   Memantine Swelling   Propranolol Other (See Comments)    Made her head feel very funny Made her head feel very funny    Codeine Nausea And Vomiting   Gabapentin (Once-Daily)     Made her feel funny.   Sulfamethoxazole Rash    REACTION: unspecified     Objective:   Today's Vitals   04/25/22 1348  BP: 120/66  Pulse: 84  Temp: 98.1 F (36.7 C)  TempSrc: Temporal  SpO2: 99%  Weight: 182 lb (82.6 kg)  Height: '5\' 4"'$  (1.626 m)   Body mass index is 31.24 kg/m.   General: Well developed, well nourished. No acute distress. Chest: Pain to light and medium touch along under the breast. There is a spot about 1/2 way along   this same line, that sets off pain from there towards the epigastrium. Psych: Alert and oriented. Normal mood and affect.  Health Maintenance  Due  Topic Date Due   Zoster Vaccines- Shingrix (1 of 2) Never done   TETANUS/TDAP  07/17/2002     Assessment & Plan:   1. Thoracic neuritis Ms. Perea has persistent pain that appears to be due to a segmental neuritis. She has had intolerable side effects from use of gabapentin and capsaicin. I will try her on amitriptyline. I will also refer her to physiatry to consider a potential costal nerve block to see if this will resolve the issue.  - amitriptyline (ELAVIL) 25 MG tablet; Take 1 tablet (25 mg total) by mouth at bedtime.  Dispense: 30 tablet; Refill: 1 - Ambulatory referral to Physical Medicine  Rehab  Return in about 3 weeks (around 05/16/2022).   [name], MD

## 2022-05-10 ENCOUNTER — Ambulatory Visit: Payer: Medicare Other | Admitting: Internal Medicine

## 2022-05-16 ENCOUNTER — Ambulatory Visit (INDEPENDENT_AMBULATORY_CARE_PROVIDER_SITE_OTHER): Payer: Medicare Other | Admitting: Family Medicine

## 2022-05-16 VITALS — BP 142/72 | HR 68 | Temp 97.1°F | Ht 64.0 in | Wt 179.8 lb

## 2022-05-16 DIAGNOSIS — M5414 Radiculopathy, thoracic region: Secondary | ICD-10-CM

## 2022-05-16 DIAGNOSIS — G250 Essential tremor: Secondary | ICD-10-CM | POA: Diagnosis not present

## 2022-05-16 MED ORDER — PRIMIDONE 250 MG PO TABS: 250.0000 mg | ORAL_TABLET | Freq: Three times a day (TID) | ORAL | 3 refills | Status: DC

## 2022-05-16 NOTE — Progress Notes (Signed)
[facility] [address] [place] [address] Dept: [phone] Dept Fax: [phone]  Chronic Care Office Visit  Subjective:    Patient ID: [name], female    DOB: 1933/12/21, 88 y.o..   MRN: [id number]  Chief Complaint  Patient presents with   Follow-up    3 week f/u.     History of Present Illness:  Patient is in today for reassessment of chronic medical issues.  I have been seeing [name] over the past 6 weeks with left-sided chest wall pain consistent with a thoracic neuritis. She is currently using amitriptyline 25 mg q hs and applying a CBD ointment. She feels this has reduced some of the pins and needles sensation and she feels her pain has reduced somewhat.  [name] has a history of essential tremor. She is managed on Valium and primidone. She notes she has good and bad days with this, today being a good day.   Past Medical History: Patient Active Problem List   Diagnosis Date Noted   Postherpetic neuralgia 04/05/2022   History of breast cancer, right 08/12/2021   Adrenal nodule- likely benign adenoma ([place]) 04/12/2021   Aortic atherosclerosis ([place]) 04/12/2021   Malignant neoplasm of upper-outer quadrant of right breast in female, estrogen receptor positive ([place]) 03/28/2021   Hyperlipidemia 03/18/2021   Invasive ductal carcinoma of breast, female, right ([place]) 02/28/2021   Vitamin D deficiency 08/05/2019   Elevated blood pressure reading without diagnosis of hypertension 04/30/2019   Allergic rhinitis 10/28/2018   OSA (obstructive sleep apnea) 11/28/2017   Iron overload 02/21/2016   Senile nuclear sclerosis 12/10/2014   Gait difficulty 03/27/2014   Osteopenia 10/31/2012   Gastro-esophageal reflux disease without esophagitis 06/25/2012   Fecal incontinence 06/25/2012   Thoracic radiculitis 07/20/2010   Diverticulosis of colon 09/26/2007   Depression with anxiety 09/26/2007   Enlarged lymph  nodes 07/26/2007   History of colonic polyps 05/15/2007   Irritable bowel syndrome 05/15/2007   Tremor, essential 02/11/2007   Osteoarthritis of both hands 02/11/2007   Past Surgical History:  Procedure Laterality Date   arthroscopy left knee     arthroscopy right knee     bmp  07/17/2000   BREAST BIOPSY Right 03/23/2021   x2   BREAST LUMPECTOMY WITH RADIOACTIVE SEED AND SENTINEL LYMPH NODE BIOPSY Right 04/20/2021   Procedure: RIGHT BREAST LUMPECTOMY WITH RADIOACTIVE SEED AND SENTINEL LYMPH NODE BIOPSY;  Surgeon: Erroll Luna, MD;  Location: [facility];  Service: General;  Laterality: Right;   CATARACT EXTRACTION, BILATERAL     COLONOSCOPY W/ POLYPECTOMY     DILATION AND CURETTAGE OF UTERUS     RIGHT FOOT SURGERIES X 2     TOTAL KNEE ARTHROPLASTY  03/13/2012   Procedure: TOTAL KNEE ARTHROPLASTY;  Surgeon: Gearlean Alf, MD;  Location: [facility];  Service: Orthopedics;  Laterality: Left;  steroid injection right knee   TOTAL KNEE ARTHROPLASTY Right 08/04/2013   Procedure: RIGHT TOTAL KNEE ARTHROPLASTY;  Surgeon: Gearlean Alf, MD;  Location: [facility];  Service: Orthopedics;  Laterality: Right;   Family History  Problem Relation Age of Onset   Stroke Mother    Kidney disease Father    Cancer Maternal Grandmother        Leukemia   Kidney failure Other        renal disease   Outpatient Medications Prior to Visit  Medication Sig Dispense Refill   amitriptyline (ELAVIL) 25 MG tablet Take 1  tablet (25 mg total) by mouth at bedtime. 30 tablet 1   cholecalciferol (VITAMIN D3) 25 MCG (1000 UNIT) tablet Take 1,000 Units by mouth daily.     diazepam (VALIUM) 5 MG tablet TAKE 1 TABLET(5 MG) BY MOUTH EVERY 8 HOURS AS NEEDED FOR ANXIETY 90 tablet 1   hyoscyamine (LEVSIN) 0.125 MG tablet Take 1 tablet (0.125 mg total) by mouth every 4 (four) hours as needed for cramping. 60 tablet 1   loratadine (CLARITIN) 10 MG tablet Take 1 tablet (10 mg total) by mouth daily. 30 tablet 11    montelukast (SINGULAIR) 10 MG tablet Take 1 tablet (10 mg total) by mouth at bedtime. 30 tablet 11   Multiple Vitamin (MULTIVITAMIN) tablet Take 1 tablet by mouth daily.     omeprazole (PRILOSEC) 20 MG capsule Take 1 capsule (20 mg total) by mouth daily. 90 capsule 3   primidone (MYSOLINE) 250 MG tablet Take 1 tablet (250 mg total) by mouth 3 (three) times daily. 270 tablet 3   No facility-administered medications prior to visit.   Allergies  Allergen Reactions   Memantine Swelling   Propranolol Other (See Comments)    Made her head feel very funny Made her head feel very funny    Codeine Nausea And Vomiting   Gabapentin (Once-Daily)     Made her feel funny.   Sulfamethoxazole Rash    REACTION: unspecified     Objective:   Today's Vitals   05/16/22 1350  BP: (!) 142/72  Pulse: 68  Temp: (!) 97.1 F (36.2 C)  TempSrc: Temporal  SpO2: 96%  Weight: 179 lb 12.8 oz (81.6 kg)  Height: '5\' 4"'$  (1.626 m)   Body mass index is 30.86 kg/m.   General: Well developed, well nourished. No acute distress. Neuro: Mild fine tremor of the hands. Psych: Alert and oriented. Normal mood and affect.  Health Maintenance Due  Topic Date Due   Zoster Vaccines- Shingrix (1 of 2) Never done   TETANUS/TDAP  07/17/2002     Assessment & Plan:   1. Thoracic neuritis [name] is doing better with the amitriptyline 25 mg daily and the use of a CBD ointment. I will plan to continue this regimen.  2. Tremor, essential Continue primidone and Valium.  - primidone (MYSOLINE) 250 MG tablet; Take 1 tablet (250 mg total) by mouth 3 (three) times daily.  Dispense: 270 tablet; Refill: 3   Return in about 3 months (around 08/16/2022) for Reassessment.   Krista Salter, MD

## 2022-05-19 ENCOUNTER — Other Ambulatory Visit: Payer: Self-pay

## 2022-05-19 DIAGNOSIS — C50411 Malignant neoplasm of upper-outer quadrant of right female breast: Secondary | ICD-10-CM

## 2022-05-22 ENCOUNTER — Inpatient Hospital Stay: Payer: Medicare Other | Attending: Nurse Practitioner | Admitting: Nurse Practitioner

## 2022-05-22 ENCOUNTER — Inpatient Hospital Stay: Payer: Medicare Other

## 2022-05-22 ENCOUNTER — Other Ambulatory Visit: Payer: Self-pay

## 2022-05-22 ENCOUNTER — Encounter: Payer: Self-pay | Admitting: Nurse Practitioner

## 2022-05-22 VITALS — BP 145/71 | HR 72 | Temp 98.0°F | Resp 17 | Ht 64.0 in | Wt 179.6 lb

## 2022-05-22 DIAGNOSIS — C50411 Malignant neoplasm of upper-outer quadrant of right female breast: Secondary | ICD-10-CM | POA: Diagnosis not present

## 2022-05-22 DIAGNOSIS — Z79899 Other long term (current) drug therapy: Secondary | ICD-10-CM | POA: Insufficient documentation

## 2022-05-22 DIAGNOSIS — Z170 Estrogen receptor positive status [ER+]: Secondary | ICD-10-CM | POA: Insufficient documentation

## 2022-05-22 DIAGNOSIS — M8580 Other specified disorders of bone density and structure, unspecified site: Secondary | ICD-10-CM | POA: Diagnosis not present

## 2022-05-22 LAB — CBC WITH DIFFERENTIAL (CANCER CENTER ONLY)
Abs Immature Granulocytes: 0.01 10*3/uL (ref 0.00–0.07)
Basophils Absolute: 0 10*3/uL (ref 0.0–0.1)
Basophils Relative: 1 %
Eosinophils Absolute: 0.1 10*3/uL (ref 0.0–0.5)
Eosinophils Relative: 2 %
HCT: 37.7 % (ref 36.0–46.0)
Hemoglobin: 13.3 g/dL (ref 12.0–15.0)
Immature Granulocytes: 0 %
Lymphocytes Relative: 46 %
Lymphs Abs: 1.7 10*3/uL (ref 0.7–4.0)
MCH: 33.9 pg (ref 26.0–34.0)
MCHC: 35.3 g/dL (ref 30.0–36.0)
MCV: 96.2 fL (ref 80.0–100.0)
Monocytes Absolute: 0.3 10*3/uL (ref 0.1–1.0)
Monocytes Relative: 9 %
Neutro Abs: 1.5 10*3/uL — ABNORMAL LOW (ref 1.7–7.7)
Neutrophils Relative %: 42 %
Platelet Count: 171 10*3/uL (ref 150–400)
RBC: 3.92 MIL/uL (ref 3.87–5.11)
RDW: 12.3 % (ref 11.5–15.5)
WBC Count: 3.7 10*3/uL — ABNORMAL LOW (ref 4.0–10.5)
nRBC: 0 % (ref 0.0–0.2)

## 2022-05-22 LAB — CMP (CANCER CENTER ONLY)
ALT: 14 U/L (ref 0–44)
AST: 19 U/L (ref 15–41)
Albumin: 4.2 g/dL (ref 3.5–5.0)
Alkaline Phosphatase: 69 U/L (ref 38–126)
Anion gap: 6 (ref 5–15)
BUN: 14 mg/dL (ref 8–23)
CO2: 28 mmol/L (ref 22–32)
Calcium: 9.2 mg/dL (ref 8.9–10.3)
Chloride: 105 mmol/L (ref 98–111)
Creatinine: 0.82 mg/dL (ref 0.44–1.00)
GFR, Estimated: >60 mL/min (ref >60)
Glucose, Bld: 85 mg/dL (ref 70–99)
Potassium: 4.6 mmol/L (ref 3.5–5.1)
Sodium: 139 mmol/L (ref 135–145)
Total Bilirubin: 0.5 mg/dL (ref 0.3–1.2)
Total Protein: 7.3 g/dL (ref 6.5–8.1)

## 2022-05-22 NOTE — Progress Notes (Signed)
[facility]   [phone] [phone]   Clinic Follow up Note   Patient Care Team: Default, Provider, MD as PCP - General Janace Litten, MD as Referring Physician (Neurology) Roel Cluck, MD as Referring Physician (Ophthalmology) Erroll Luna, MD as Consulting Physician (General Surgery) Truitt Merle, MD as Consulting Physician (Hematology) Gery Pray, MD as Consulting Physician (Radiation Oncology) Mauro Kaufmann, RN as Oncology Nurse Navigator Rockwell Germany, RN as Oncology Nurse Navigator Alla Feeling, NP as Nurse Practitioner (Nurse Practitioner) 05/22/2022  CHIEF COMPLAINT: Follow-up right breast cancer  SUMMARY OF ONCOLOGIC HISTORY: Oncology History Overview Note  Cancer Staging Malignant neoplasm of upper-outer quadrant of right breast in female, estrogen receptor positive ([place]) Staging form: Breast, AJCC 8th Edition - Clinical stage from 03/23/2021: Stage IA (cT1c, cN0, cM0, G2, ER+, PR+, HER2-) - Signed by Truitt Merle, MD on 03/30/2021 - Pathologic stage from 04/20/2021: Stage IA (pT1c, pN0, cM0, G2, ER+, PR+, HER2-) - Signed by Truitt Merle, MD on 04/28/2021     Malignant neoplasm of upper-outer quadrant of right breast in female, estrogen receptor positive ([place])  02/28/2021 Mammogram   Bilateral Diagnostic and Bilateral Breast Ultrasound  IMPRESSION: 1. 1.5 cm mass with imaging features highly suspicious for malignancy in the 12 o'clock position of the right breast. 2. 2 right axillary lymph nodes with borderline eccentric cortical thickening, suspicious for possible metastatic nodes. 3. No evidence of malignancy on the left.   03/23/2021 Cancer Staging   Staging form: Breast, AJCC 8th Edition - Clinical stage from 03/23/2021: Stage IA (cT1c, cN0, cM0, G2, ER+, PR+, HER2-) - Signed by Truitt Merle, MD on 03/30/2021 Stage prefix: Initial diagnosis Histologic grading system: 3 grade system   03/23/2021 Pathology Results    Diagnosis 1. Breast, right, needle core biopsy, 12:00 3cm fn, ribbon clip - INVASIVE DUCTAL CARCINOMA. SEE NOTE - DUCTAL CARCINOMA IN SITU, LOW-GRADE 2. Lymph node, needle/core biopsy, right axilla, q clip - BENIGN LYMPH NODE Diagnosis Note 1. Carcinoma measures 1.2 cm in greatest linear dimension and appears grade 2.  Prognostic panel pending.   03/28/2021 Initial Diagnosis   Malignant neoplasm of upper-outer quadrant of right breast in female, estrogen receptor positive ([place])   04/20/2021 Cancer Staging   Staging form: Breast, AJCC 8th Edition - Pathologic stage from 04/20/2021: Stage IA (pT1c, pN0, cM0, G2, ER+, PR+, HER2-) - Signed by Truitt Merle, MD on 04/28/2021 Stage prefix: Initial diagnosis Multigene prognostic tests performed: None Histologic grading system: 3 grade system Residual tumor (R): R0 - None   04/20/2021 Pathology Results   FINAL MICROSCOPIC DIAGNOSIS:   A. BREAST, RIGHT, LUMPECTOMY:  -  Invasive ductal carcinoma, Nottingham grade 2 and of 3, 1.7 cm  -  Ductal carcinoma in-situ, low to intermediate grade  -  Margins uninvolved by carcinoma (see part B below)  -  Previous biopsy site changes present  -  See oncology table and comment below   B. BREAST, RIGHT ADDITIONAL ANTERIOR-LATERAL MARGIN, EXCISION:  -  No residual carcinoma identified   C. LYMPH NODE, RIGHT AXILLARY, SENTINEL, EXCISION:  -  No carcinoma identified in one lymph node (0/1)   D. LYMPH NODE, RIGHT AXILLARY, SENTINEL, EXCISION:  -  No carcinoma identified in one lymph node (0/1)      CURRENT THERAPY: Surveillance  INTERVAL HISTORY: [name] returns with her daughter for follow-up as scheduled, last seen by Dr. Burr Medico 11/16/2021.  She was eventually diagnosed with "internal shingles" in the interim, managing with  CBD cream.  She also had COVID in May and has recovered.  She was taking med for a period of time that caused extreme drowsiness, she stopped taking it and this is better but she  still sleeps a lot.  Denies unintentional weight loss, new pain, new breast mass/lump, nipple discharge or inversion, or skin change.  All other systems were reviewed with the patient and are negative.  MEDICAL HISTORY:  Past Medical History:  Diagnosis Date   Abnormal results of liver function studies 05/15/2007   Allergy    Anal fissure 09/26/2007   Formatting of this note might be different from the original. Qualifier: Diagnosis of  By: Nils Pyle CMA (AAMA), Leisha   Anxiety    B12 deficiency 05/05/2020   Chicken pox    Essential tremor    of head, sometimes hands.  pt takes primidone to help the tremors-DR. MILLER-NEUROLOGIST IN [place].  PT WAS STARTED ON VALUIM MANY YEARS AGO FOR HER TREMORS--AND CONTINUES TO TAKE.   Fracture of humerus, proximal, left, closed 10/28/2011   pt has finished physical therapy-limited ROM reaching to her back and unable to lift heavy things with left hand/arm   GERD (gastroesophageal reflux disease)    H/O hiatal hernia    Hereditary and idiopathic peripheral neuropathy 03/27/2014   Hx of colonic polyps    Osteoarthritis    PAIN AND OA BILATERAL KNEES; ALSO HAS ARTHRITIS IN HANDS AND BACK BUT NO BACK PAIN   Other chronic otitis externa 03/01/2009   Formatting of this note might be different from the original. Qualifier: Diagnosis of  By: Arnoldo Morale MD, Balinda Quails    SURGICAL HISTORY: Past Surgical History:  Procedure Laterality Date   arthroscopy left knee     arthroscopy right knee     bmp  07/17/2000   BREAST BIOPSY Right 03/23/2021   x2   BREAST LUMPECTOMY WITH RADIOACTIVE SEED AND SENTINEL LYMPH NODE BIOPSY Right 04/20/2021   Procedure: RIGHT BREAST LUMPECTOMY WITH RADIOACTIVE SEED AND SENTINEL LYMPH NODE BIOPSY;  Surgeon: Erroll Luna, MD;  Location: [facility];  Service: General;  Laterality: Right;   CATARACT EXTRACTION, BILATERAL     COLONOSCOPY W/ POLYPECTOMY     DILATION AND CURETTAGE OF UTERUS     RIGHT FOOT SURGERIES X  2     TOTAL KNEE ARTHROPLASTY  03/13/2012   Procedure: TOTAL KNEE ARTHROPLASTY;  Surgeon: Gearlean Alf, MD;  Location: [facility];  Service: Orthopedics;  Laterality: Left;  steroid injection right knee   TOTAL KNEE ARTHROPLASTY Right 08/04/2013   Procedure: RIGHT TOTAL KNEE ARTHROPLASTY;  Surgeon: Gearlean Alf, MD;  Location: [facility];  Service: Orthopedics;  Laterality: Right;    I have reviewed the social history and family history with the patient and they are unchanged from previous note.  ALLERGIES:  is allergic to memantine, propranolol, codeine, gabapentin (once-daily), and sulfamethoxazole.  MEDICATIONS:  Current Outpatient Medications  Medication Sig Dispense Refill   amitriptyline (ELAVIL) 25 MG tablet Take 1 tablet (25 mg total) by mouth at bedtime. 30 tablet 1   cholecalciferol (VITAMIN D3) 25 MCG (1000 UNIT) tablet Take 1,000 Units by mouth daily.     diazepam (VALIUM) 5 MG tablet TAKE 1 TABLET(5 MG) BY MOUTH EVERY 8 HOURS AS NEEDED FOR ANXIETY 90 tablet 1   hyoscyamine (LEVSIN) 0.125 MG tablet Take 1 tablet (0.125 mg total) by mouth every 4 (four) hours as needed for cramping. 60 tablet 1   loratadine (CLARITIN) 10 MG  tablet Take 1 tablet (10 mg total) by mouth daily. 30 tablet 11   montelukast (SINGULAIR) 10 MG tablet Take 1 tablet (10 mg total) by mouth at bedtime. 30 tablet 11   Multiple Vitamin (MULTIVITAMIN) tablet Take 1 tablet by mouth daily.     omeprazole (PRILOSEC) 20 MG capsule Take 1 capsule (20 mg total) by mouth daily. 90 capsule 3   primidone (MYSOLINE) 250 MG tablet Take 1 tablet (250 mg total) by mouth 3 (three) times daily. 270 tablet 3   No current facility-administered medications for this visit.    PHYSICAL EXAMINATION: ECOG PERFORMANCE STATUS: 0 - Asymptomatic  Vitals:   05/22/22 1207  BP: (!) 145/71  Pulse: 72  Resp: 17  Temp: 98 F (36.7 C)  SpO2: 99%   Filed Weights   05/22/22 1207  Weight: 179 lb 9.6 oz (81.5 kg)    GENERAL:alert,  no distress and comfortable SKIN: No rash EYES: sclera clear NECK: Without mass LYMPH:  no palpable cervical or supraclavicular lymphadenopathy LUNGS:  normal breathing effort HEART:  no lower extremity edema ABDOMEN: abdomen soft, non-tender and normal bowel sounds Musculoskeletal: Tenderness at the anterolateral left low chest wall NEURO: alert & oriented x 3 with fluent speech Breast exam: S/p right lumpectomy, incisions completely healed.  No palpable mass or nodularity in either breast or axilla that I could appreciate.  LABORATORY DATA:  I have reviewed the data as listed    Latest Ref Rng & Units 05/22/2022   11:46 AM 02/08/2022    4:29 PM 11/16/2021   12:50 PM  CBC  WBC 4.0 - 10.5 K/uL 3.7  5.4  4.7   Hemoglobin 12.0 - 15.0 g/dL 13.3  13.6  13.7   Hematocrit 36.0 - 46.0 % 37.7  39.5  39.1   Platelets 150 - 400 K/uL 171  173.0  163         Latest Ref Rng & Units 05/22/2022   11:46 AM 02/08/2022    4:29 PM 11/16/2021   12:50 PM  CMP  Glucose 70 - 99 mg/dL 85  117  91   BUN 8 - 23 mg/dL _0 Creatinine 0.44 - 1.00 mg/dL 0.82  1.12  0.96   Sodium 135 - 145 mmol/L 139  138  140   Potassium 3.5 - 5.1 mmol/L 4.6  3.9  4.7   Chloride 98 - 111 mmol/L 105  104  104   CO2 22 - 32 mmol/L _1 Calcium 8.9 - 10.3 mg/dL 9.2  9.1  9.6   Total Protein 6.5 - 8.1 g/dL 7.3  7.2  7.5   Total Bilirubin 0.3 - 1.2 mg/dL 0.5  0.3  0.5   Alkaline Phos 38 - 126 U/L 69  64  69   AST 15 - 41 U/L _2 ALT 0 - 44 U/L _3 RADIOGRAPHIC STUDIES: I have personally reviewed the radiological images as listed and agreed with the findings in the report. No results found.   ASSESSMENT & PLAN: [name] is a 88 y.o. post-menopausal female with    1. Malignant neoplasm of upper-outer quadrant of right breast, Stage IA, p(T1c, N0), ER+/PR+/HER2-, Grade 2 -Diagnosed 03/2021, s/p right lumpectomy on 04/20/21, radiation deferred due to her advanced age and stage I  disease.  She declined tamoxifen and is not a good candidate for  AI -Currently on surveillance alone -[name] is clinically doing well, breast exam is benign.  No concern for breast cancer recurrence. -Is overdue for mammogram, order placed today -Labs reviewed, CMP is normal, CBC normal except mild leukopenia/neutropenia.  Possibly from recent COVID and shingles -Continue breast cancer surveillance, next follow-up in 6 months or sooner if needed   2. Osteopenia -DEXA 10/17/21 showed worsening osteopenia, to -2.1. -She declined tamoxifen  -she is taking vit D but ran out, I encouraged her to restart, and add calcium, and continue weightbearing exercise.   3. Left chest pain and tenderness, thoracic neuritis -uncertain of etiology -chest CT 04/11/21 showed no acute abnormalities. -She continued to have chest wall pain, chest x-ray and left rib x-ray 9/6 were unremarkable. -She was eventually diagnosed with shingles and is being treated for thoracic neuritis, PCP note indicates amitriptyline and CBD cream but she is only taking CBD cream which is helpful   Plan: -Labs reviewed -Continue breast cancer surveillance -Proceed with mammogram in the next few weeks, she is overdue; order placed today -Next surveillance visit in 6 months, or sooner if needed -Restart vitamin D   Orders Placed This Encounter  Procedures   MM DIAG BREAST TOMO BILATERAL    Standing Status:   Future    Standing Expiration Date:   05/23/2023    Order Specific Question:   Reason for Exam (SYMPTOM  OR DIAGNOSIS REQUIRED)    Answer:   h/o R breast cancer 02/2021 s/p lumpectomy    Order Specific Question:   Preferred imaging location?    Answer:   [facility]   All questions were answered. The patient knows to call the clinic with any problems, questions or concerns. No barriers to learning were detected.      Alla Feeling, NP 05/22/22

## 2022-05-23 ENCOUNTER — Encounter: Payer: Self-pay | Admitting: Physical Medicine & Rehabilitation

## 2022-05-23 ENCOUNTER — Encounter: Payer: Medicare Other | Attending: Physical Medicine & Rehabilitation | Admitting: Physical Medicine & Rehabilitation

## 2022-05-23 VITALS — BP 111/71 | HR 67 | Ht 64.0 in | Wt 180.0 lb

## 2022-05-23 DIAGNOSIS — G588 Other specified mononeuropathies: Secondary | ICD-10-CM | POA: Insufficient documentation

## 2022-05-23 DIAGNOSIS — M5414 Radiculopathy, thoracic region: Secondary | ICD-10-CM | POA: Insufficient documentation

## 2022-05-23 MED ORDER — LIDOCAINE 4 % EX PTCH: 1.0000 | MEDICATED_PATCH | CUTANEOUS | 2 refills | Status: DC

## 2022-05-23 MED ORDER — AMITRIPTYLINE HCL 25 MG PO TABS: 50.0000 mg | ORAL_TABLET | Freq: Every day | ORAL | 6 refills | Status: DC

## 2022-05-23 NOTE — Progress Notes (Signed)
Subjective:    Patient ID: [name], female    DOB: May 12, 1934, 88 y.o.   MRN: [id number]  HPI [name] is a very pleasant 88 year old woman with past medical history of IBS, essential tremor, osteoarthritis of the hands, depression, breast cancer status post right lumpectomy who is here for R lumbectompy who is here for left chest wall pain and tenderness related to suspected thoracic neuritis.  Patient reports she developed pain in her left back and chest wall about 2 to 3 months ago.  She reports a stinging pain in this area that was tender to palpation.  Several weeks ago the pain moved more anteriorly and this hurts primarily from her left chest wall to under her left breast ending at her sternum.  Pain has been gradually improving.  She has allodynia in this area.  Her PCP suspected this was related to internal shingles.  She had chest and rib x-rays that did not show significant abnormality.  She also had a CT chest about a year ago 04/11/2021 that did not show disease in this area.  She was started on amitriptyline which has improved her pain.  She has not had any side effects with this medication.  CBD cream is also helpful.  Prior medications Gabapentin made her feel drunk Capsaicin cream made it worse Amitripyline 25 mg with mild to moderate improvement  Pain Inventory Average Pain 7 Pain Right Now 5 My pain is intermittent, sharp, and burning  In the last 24 hours, has pain interfered with the following? General activity 0 Relation with others 0 Enjoyment of life 0 What TIME of day is your pain at its worst? daytime Sleep (in general) Good  Pain is worse with: unsure Pain improves with: medication Relief from Meds: 8  walk without assistance how many minutes can you walk? No problem ability to climb steps?  yes do you drive?  yes  retired  bladder control problems tremor dizziness anxiety  New pt  New pt    Family History  Problem Relation  Age of Onset   Stroke Mother    Kidney disease Father    Cancer Maternal Grandmother        Leukemia   Kidney failure Other        renal disease   Social History   Socioeconomic History   Marital status: Widowed    Spouse name: Not on file   Number of children: 3   Years of education: Not on file   Highest education level: Not on file  Occupational History   Not on file  Tobacco Use   Smoking status: Never   Smokeless tobacco: Never  Vaping Use   Vaping Use: Never used  Substance and Sexual Activity   Alcohol use: Yes    Comment: Rare   Drug use: No   Sexual activity: Not Currently  Other Topics Concern   Not on file  Social History Narrative   Not on file   Social Determinants of Health   Financial Resource Strain: Low Risk  (07/19/2021)   Overall Financial Resource Strain (CARDIA)    Difficulty of Paying Living Expenses: Not hard at all  Food Insecurity: No Food Insecurity (03/30/2021)   Hunger Vital Sign    Worried About Running Out of Food in the Last Year: Never true    Ran Out of Food in the Last Year: Never true  Transportation Needs: No Transportation Needs (07/19/2021)   PRAPARE - Transportation  Lack of Transportation (Medical): No    Lack of Transportation (Non-Medical): No  Physical Activity: Inactive (07/19/2021)   Exercise Vital Sign    Days of Exercise per Week: 0 days    Minutes of Exercise per Session: 0 min  Stress: No Stress Concern Present (04/20/2020)   [place]    Feeling of Stress : Only a little  Social Connections: Moderately Isolated (07/19/2021)   Social Connection and Isolation Panel [NHANES]    Frequency of Communication with Friends and Family: Twice a week    Frequency of Social Gatherings with Friends and Family: Twice a week    Attends Religious Services: 1 to 4 times per year    Active Member of [employer] or Organizations: No    Attends Archivist Meetings:  Never    Marital Status: Widowed   Past Surgical History:  Procedure Laterality Date   arthroscopy left knee     arthroscopy right knee     bmp  07/17/2000   BREAST BIOPSY Right 03/23/2021   x2   BREAST LUMPECTOMY WITH RADIOACTIVE SEED AND SENTINEL LYMPH NODE BIOPSY Right 04/20/2021   Procedure: RIGHT BREAST LUMPECTOMY WITH RADIOACTIVE SEED AND SENTINEL LYMPH NODE BIOPSY;  Surgeon: Erroll Luna, MD;  Location: [place];  Service: General;  Laterality: Right;   CATARACT EXTRACTION, BILATERAL     COLONOSCOPY W/ POLYPECTOMY     DILATION AND CURETTAGE OF UTERUS     RIGHT FOOT SURGERIES X 2     TOTAL KNEE ARTHROPLASTY  03/13/2012   Procedure: TOTAL KNEE ARTHROPLASTY;  Surgeon: Gearlean Alf, MD;  Location[facility];  Service: Orthopedics;  Laterality: Left;  steroid injection right knee   TOTAL KNEE ARTHROPLASTY Right 08/04/2013   Procedure: RIGHT TOTAL KNEE ARTHROPLASTY;  Surgeon: Gearlean Alf, MD;  Location[facility];  Service: Orthopedics;  Laterality: Right;   Past Medical History:  Diagnosis Date   Abnormal results of liver function studies 05/15/2007   Allergy    Anal fissure 09/26/2007   Formatting of this note might be different from the original. Qualifier: Diagnosis of  By: Nils Pyle CMA (AAMA), Leisha   Anxiety    B12 deficiency 05/05/2020   Chicken pox    Essential tremor    of head, sometimes hands.  pt takes primidone to help the tremors-DR. MILLER-NEUROLOGIST IN [place].  PT WAS STARTED ON VALUIM MANY YEARS AGO FOR HER TREMORS--AND CONTINUES TO TAKE.   Fracture of humerus, proximal, left, closed 10/28/2011   pt has finished physical therapy-limited ROM reaching to her back and unable to lift heavy things with left hand/arm   GERD (gastroesophageal reflux disease)    H/O hiatal hernia    Hereditary and idiopathic peripheral neuropathy 03/27/2014   Hx of colonic polyps    Osteoarthritis    PAIN AND OA BILATERAL KNEES; ALSO HAS ARTHRITIS IN HANDS AND  BACK BUT NO BACK PAIN   Other chronic otitis externa 03/01/2009   Formatting of this note might be different from the original. Qualifier: Diagnosis of  By: Arnoldo Morale MD, John E   BP 111/71   Pulse 67   Ht '5\' 4"'$  (1.626 m)   Wt 180 lb (81.6 kg)   SpO2 96%   BMI 30.90 kg/m   Opioid Risk Score:   Fall Risk Score:  `1  Depression screen [facility] 2/9     05/23/2022   11:00 AM 07/19/2021    1:04 PM 07/19/2021  12:57 PM 05/12/2021   10:40 AM 04/20/2020   11:28 AM 11/13/2017    1:14 PM  Depression screen PHQ 2/9  Decreased Interest 2 0 0 0 0 0  Down, Depressed, Hopeless 0 0 0 1 0 0  PHQ - 2 Score 2 0 0 1 0 0  Altered sleeping 0       Tired, decreased energy 2       Change in appetite 2       Feeling bad or failure about yourself  1       Trouble concentrating 2       Moving slowly or fidgety/restless 0       Suicidal thoughts 0       PHQ-9 Score 9       Difficult doing work/chores Somewhat difficult          Review of Systems  Musculoskeletal:        Pain, burning under left breast and in back  Neurological:  Positive for dizziness and tremors.  Psychiatric/Behavioral:  The patient is nervous/anxious.        Objective:   Physical Exam  Gen: no distress, normal appearing HEENT: oral mucosa pink and moist, NCAT Chest: normal effort, normal rate of breathing Abd: soft, non-distended Ext: no edema Psych: pleasant, normal affect Skin: intact Neuro: Awake and alert, cranial nerves II through XII grossly intact, follows commands Sensation intact light touch in all 4 extremities Strength 5 out of 5 in all 4 extremities Tremor with movements in bilateral upper extremities Musculoskeletal: No joint swelling or tenderness noted, no paraspinal tenderness in the cervical lumbar thoracic spine Allodynia over L chest wall and at approximately T7 dermatome     Assessment & Plan:   Intercostal neuralgia -She did not tolerate gabapentin or capsaicin cream.  Not sure that she will  tolerate Qutenza well -Internal shingles or zoster sine herpete likely cause this condition is often hard to diagnose due to absence of shingles rash.  Other causes of nerve inflammation or compression could be possible -Pain has been improving gradually and she reports response to amitriptyline and CBD cream.  Will increase amitriptyline dose to 50 mg at bedtime -We will order lidocaine patch to try -As pain appears to be improving we will hold off on interventional techniques such as intercostal nerve block for now -Consider Lyrica at a later time if needed

## 2022-05-29 ENCOUNTER — Ambulatory Visit: Payer: Medicare Other

## 2022-06-05 ENCOUNTER — Ambulatory Visit: Payer: Medicare Other | Attending: Surgery

## 2022-06-05 VITALS — Wt 178.0 lb

## 2022-06-05 DIAGNOSIS — Z483 Aftercare following surgery for neoplasm: Secondary | ICD-10-CM | POA: Insufficient documentation

## 2022-06-05 NOTE — Therapy (Signed)
OUTPATIENT PHYSICAL THERAPY SOZO SCREENING NOTE   Patient Name: [name] MRN: [id number] DOB:1933/09/25, 88 y.o., female Today's Date: 06/05/2022  PCP: Default, Provider, MD REFERRING PROVIDER: Erroll Luna, MD   PT End of Session - 06/05/22 1607     Visit Number 1   # unchanged due to screen only   PT Start Time 1605    PT Stop Time 1610    PT Time Calculation (min) 5 min    Activity Tolerance Patient tolerated treatment well    Behavior During Therapy [facility] for tasks assessed/performed             Past Medical History:  Diagnosis Date   Abnormal results of liver function studies 05/15/2007   Allergy    Anal fissure 09/26/2007   Formatting of this note might be different from the original. Qualifier: Diagnosis of  By: Nils Pyle CMA (AAMA), Leisha   Anxiety    B12 deficiency 05/05/2020   Chicken pox    Essential tremor    of head, sometimes hands.  pt takes primidone to help the tremors-DR. MILLER-NEUROLOGIST IN [place].  PT WAS STARTED ON VALUIM MANY YEARS AGO FOR HER TREMORS--AND CONTINUES TO TAKE.   Fracture of humerus, proximal, left, closed 10/28/2011   pt has finished physical therapy-limited ROM reaching to her back and unable to lift heavy things with left hand/arm   GERD (gastroesophageal reflux disease)    H/O hiatal hernia    Hereditary and idiopathic peripheral neuropathy 03/27/2014   Hx of colonic polyps    Osteoarthritis    PAIN AND OA BILATERAL KNEES; ALSO HAS ARTHRITIS IN HANDS AND BACK BUT NO BACK PAIN   Other chronic otitis externa 03/01/2009   Formatting of this note might be different from the original. Qualifier: Diagnosis of  By: Arnoldo Morale MD, Balinda Quails   Past Surgical History:  Procedure Laterality Date   arthroscopy left knee     arthroscopy right knee     bmp  07/17/2000   BREAST BIOPSY Right 03/23/2021   x2   BREAST LUMPECTOMY WITH RADIOACTIVE SEED AND SENTINEL LYMPH NODE BIOPSY Right 04/20/2021   Procedure: RIGHT BREAST LUMPECTOMY  WITH RADIOACTIVE SEED AND SENTINEL LYMPH NODE BIOPSY;  Surgeon: Erroll Luna, MD;  Location: [place];  Service: General;  Laterality: Right;   CATARACT EXTRACTION, BILATERAL     COLONOSCOPY W/ POLYPECTOMY     DILATION AND CURETTAGE OF UTERUS     RIGHT FOOT SURGERIES X 2     TOTAL KNEE ARTHROPLASTY  03/13/2012   Procedure: TOTAL KNEE ARTHROPLASTY;  Surgeon: Gearlean Alf, MD;  [facility];  Service: Orthopedics;  Laterality: Left;  steroid injection right knee   TOTAL KNEE ARTHROPLASTY Right 08/04/2013   Procedure: RIGHT TOTAL KNEE ARTHROPLASTY;  Surgeon: Gearlean Alf, MD;  [facility];  Service: Orthopedics;  Laterality: Right;   Patient Active Problem List   Diagnosis Date Noted   Postherpetic neuralgia 04/05/2022   History of breast cancer, right 08/12/2021   Adrenal nodule- likely benign adenoma ([place]) 04/12/2021   Aortic atherosclerosis ([place]) 04/12/2021   Malignant neoplasm of upper-outer quadrant of right breast in female, estrogen receptor positive ([place]) 03/28/2021   Hyperlipidemia 03/18/2021   Invasive ductal carcinoma of breast, female, right ([place]) 02/28/2021   Vitamin D deficiency 08/05/2019   Elevated blood pressure reading without diagnosis of hypertension 04/30/2019   Allergic rhinitis 10/28/2018   OSA (obstructive sleep apnea) 11/28/2017   Iron overload 02/21/2016  Senile nuclear sclerosis 12/10/2014   Gait difficulty 03/27/2014   Osteopenia 10/31/2012   Gastro-esophageal reflux disease without esophagitis 06/25/2012   Fecal incontinence 06/25/2012   Thoracic radiculitis 07/20/2010   Diverticulosis of colon 09/26/2007   Depression with anxiety 09/26/2007   Enlarged lymph nodes 07/26/2007   History of colonic polyps 05/15/2007   Irritable bowel syndrome 05/15/2007   Tremor, essential 02/11/2007   Osteoarthritis of both hands 02/11/2007    REFERRING DIAG: right breast cancer at risk for lymphedema  THERAPY DIAG: Aftercare  following surgery for neoplasm  PERTINENT HISTORY: Patient was diagnosed on 02/28/2021 with right grade II invasive ductal carcinoma breast cancer. She underwent a right lumpectomy and sentinel node biopsy (5 negative nodes) on 04/20/2021. It is ER/PR positive and HER2 negative with a Ki67 of 1%. She had bilateral knee replacements 10 years ago.   PRECAUTIONS: right UE Lymphedema risk, None  SUBJECTIVE: Pt returns for her 3 month L-Dex screen.   PAIN:  Are you having pain? No  SOZO SCREENING: Patient was assessed today using the SOZO machine to determine the lymphedema index score. This was compared to her baseline score. It was determined that she is within the recommended range when compared to her baseline and no further action is needed at this time. She will continue SOZO screenings. These are done every 3 months for 2 years post operatively followed by every 6 months for 2 years, and then annually.   L-DEX FLOWSHEETS - 06/05/22 1600       L-DEX LYMPHEDEMA SCREENING   Measurement Type Unilateral    L-DEX MEASUREMENT EXTREMITY Upper Extremity    POSITION  Standing    DOMINANT SIDE Right    At Risk Side Right    BASELINE SCORE (UNILATERAL) 3.9    L-DEX SCORE (UNILATERAL) 2.8    VALUE CHANGE (UNILAT) -1.1              Otelia Limes, PTA 06/05/2022, 4:09 PM

## 2022-06-23 ENCOUNTER — Encounter: Payer: Self-pay | Admitting: Physical Medicine & Rehabilitation

## 2022-06-23 ENCOUNTER — Encounter: Payer: Medicare Other | Attending: Physical Medicine & Rehabilitation | Admitting: Physical Medicine & Rehabilitation

## 2022-06-23 VITALS — BP 129/79 | HR 72 | Ht 64.0 in | Wt 174.6 lb

## 2022-06-23 DIAGNOSIS — G588 Other specified mononeuropathies: Secondary | ICD-10-CM | POA: Diagnosis not present

## 2022-06-23 MED ORDER — DULOXETINE HCL 20 MG PO CPEP: 20.0000 mg | ORAL_CAPSULE | Freq: Every day | ORAL | 4 refills | Status: DC

## 2022-06-23 NOTE — Progress Notes (Unsigned)
Subjective:    Patient ID: [name], female    DOB: 06-14-1934, 88 y.o.   MRN: [id number]  HPI [name] is a very pleasant 88 year old woman with past medical history of IBS, essential tremor, osteoarthritis of the hands, depression, breast cancer status post right lumpectomy who is here for R lumbectompy who is here for left chest wall pain and tenderness related to suspected thoracic neuritis.  Patient reports she developed pain in her left back and chest wall about 2 to 3 months ago.  She reports a stinging pain in this area that was tender to palpation.  Several weeks ago the pain moved more anteriorly and this hurts primarily from her left chest wall to under her left breast ending at her sternum.  Pain has been gradually improving.  She has allodynia in this area.  Her PCP suspected this was related to internal shingles.  She had chest and rib x-rays that did not show significant abnormality.  She also had a CT chest about a year ago 04/11/2021 that did not show disease in this area.  She was started on amitriptyline which has improved her pain.  She has not had any side effects with this medication.  CBD cream is also helpful.   Prior medications Gabapentin made her feel drunk Capsaicin cream made it worse Amitripyline  Interval History [name] is here for follow-up regarding her chronic chest wall pain.  She reports pain is about the same as it was during prior visit.  It is currently located around her anterior chest wall around her lower left breast.  She discontinued amitriptyline because she was sleeping a lot.  Pain and sedation has not changed significantly since discontinuing amitriptyline.  She does report her mood is occasionally decreased as well.  Pain Inventory Average Pain 6 Pain Right Now 6 My pain is aching  In the last 24 hours, has pain interfered with the following? General activity 0 Relation with others 0 Enjoyment of life 0 What TIME of  day is your pain at its worst? varies Sleep (in general) Good  Pain is worse with:  . Pain improves with:  . Relief from Meds:  .  Family History  Problem Relation Age of Onset   Stroke Mother    Kidney disease Father    Cancer Maternal Grandmother        Leukemia   Kidney failure Other        renal disease   Social History   Socioeconomic History   Marital status: Widowed    Spouse name: Not on file   Number of children: 3   Years of education: Not on file   Highest education level: Not on file  Occupational History   Not on file  Tobacco Use   Smoking status: Never   Smokeless tobacco: Never  Vaping Use   Vaping Use: Never used  Substance and Sexual Activity   Alcohol use: Yes    Comment: Rare   Drug use: No   Sexual activity: Not Currently  Other Topics Concern   Not on file  Social History Narrative   Not on file   Social Determinants of Health   Financial Resource Strain: Low Risk  (07/19/2021)   Overall Financial Resource Strain (CARDIA)    Difficulty of Paying Living Expenses: Not hard at all  Food Insecurity: No Food Insecurity (03/30/2021)   Hunger Vital Sign    Worried About Running Out of Food in the Last Year:  Never true    Ran Out of Food in the Last Year: Never true  Transportation Needs: No Transportation Needs (07/19/2021)   PRAPARE - Hydrologist (Medical): No    Lack of Transportation (Non-Medical): No  Physical Activity: Inactive (07/19/2021)   Exercise Vital Sign    Days of Exercise per Week: 0 days    Minutes of Exercise per Session: 0 min  Stress: No Stress Concern Present (04/20/2020)   [place]    Feeling of Stress : Only a little  Social Connections: Moderately Isolated (07/19/2021)   Social Connection and Isolation Panel [NHANES]    Frequency of Communication with Friends and Family: Twice a week    Frequency of Social Gatherings with Friends  and Family: Twice a week    Attends Religious Services: 1 to 4 times per year    Active Member of [employer] or Organizations: No    Attends Archivist Meetings: Never    Marital Status: Widowed   Past Surgical History:  Procedure Laterality Date   arthroscopy left knee     arthroscopy right knee     bmp  07/17/2000   BREAST BIOPSY Right 03/23/2021   x2   BREAST LUMPECTOMY WITH RADIOACTIVE SEED AND SENTINEL LYMPH NODE BIOPSY Right 04/20/2021   Procedure: RIGHT BREAST LUMPECTOMY WITH RADIOACTIVE SEED AND SENTINEL LYMPH NODE BIOPSY;  Surgeon: Erroll Luna, MD;  Location: [place];  Service: General;  Laterality: Right;   CATARACT EXTRACTION, BILATERAL     COLONOSCOPY W/ POLYPECTOMY     DILATION AND CURETTAGE OF UTERUS     RIGHT FOOT SURGERIES X 2     TOTAL KNEE ARTHROPLASTY  03/13/2012   Procedure: TOTAL KNEE ARTHROPLASTY;  Surgeon: Gearlean Alf, MD;  [facility];  Service: Orthopedics;  Laterality: Left;  steroid injection right knee   TOTAL KNEE ARTHROPLASTY Right 08/04/2013   Procedure: RIGHT TOTAL KNEE ARTHROPLASTY;  Surgeon: Gearlean Alf, MD;  [facility];  Service: Orthopedics;  Laterality: Right;   Past Surgical History:  Procedure Laterality Date   arthroscopy left knee     arthroscopy right knee     bmp  07/17/2000   BREAST BIOPSY Right 03/23/2021   x2   BREAST LUMPECTOMY WITH RADIOACTIVE SEED AND SENTINEL LYMPH NODE BIOPSY Right 04/20/2021   Procedure: RIGHT BREAST LUMPECTOMY WITH RADIOACTIVE SEED AND SENTINEL LYMPH NODE BIOPSY;  Surgeon: Erroll Luna, MD;  Location: [place];  Service: General;  Laterality: Right;   CATARACT EXTRACTION, BILATERAL     COLONOSCOPY W/ POLYPECTOMY     DILATION AND CURETTAGE OF UTERUS     RIGHT FOOT SURGERIES X 2     TOTAL KNEE ARTHROPLASTY  03/13/2012   Procedure: TOTAL KNEE ARTHROPLASTY;  Surgeon: Gearlean Alf, MD;  [facility];  Service: Orthopedics;  Laterality: Left;   steroid injection right knee   TOTAL KNEE ARTHROPLASTY Right 08/04/2013   Procedure: RIGHT TOTAL KNEE ARTHROPLASTY;  Surgeon: Gearlean Alf, MD;  [facility];  Service: Orthopedics;  Laterality: Right;   Past Medical History:  Diagnosis Date   Abnormal results of liver function studies 05/15/2007   Allergy    Anal fissure 09/26/2007   Formatting of this note might be different from the original. Qualifier: Diagnosis of  By: Nils Pyle CMA (AAMA), Leisha   Anxiety    B12 deficiency 05/05/2020   Chicken pox  Essential tremor    of head, sometimes hands.  pt takes primidone to help the tremors-DR. MILLER-NEUROLOGIST IN [place].  PT WAS STARTED ON VALUIM MANY YEARS AGO FOR HER TREMORS--AND CONTINUES TO TAKE.   Fracture of humerus, proximal, left, closed 10/28/2011   pt has finished physical therapy-limited ROM reaching to her back and unable to lift heavy things with left hand/arm   GERD (gastroesophageal reflux disease)    H/O hiatal hernia    Hereditary and idiopathic peripheral neuropathy 03/27/2014   Hx of colonic polyps    Osteoarthritis    PAIN AND OA BILATERAL KNEES; ALSO HAS ARTHRITIS IN HANDS AND BACK BUT NO BACK PAIN   Other chronic otitis externa 03/01/2009   Formatting of this note might be different from the original. Qualifier: Diagnosis of  By: Arnoldo Morale MD, John E   BP 129/79   Pulse 72   Ht '5\' 4"'$  (1.626 m)   Wt 174 lb 9.6 oz (79.2 kg)   SpO2 96%   BMI 29.97 kg/m   Opioid Risk Score:   Fall Risk Score:  `1  Depression screen [facility] 2/9     05/23/2022   11:00 AM 07/19/2021    1:04 PM 07/19/2021   12:57 PM 05/12/2021   10:40 AM 04/20/2020   11:28 AM 11/13/2017    1:14 PM  Depression screen PHQ 2/9  Decreased Interest 2 0 0 0 0 0  Down, Depressed, Hopeless 0 0 0 1 0 0  PHQ - 2 Score 2 0 0 1 0 0  Altered sleeping 0       Tired, decreased energy 2       Change in appetite 2       Feeling bad or failure about yourself  1       Trouble concentrating 2        Moving slowly or fidgety/restless 0       Suicidal thoughts 0       PHQ-9 Score 9       Difficult doing work/chores Somewhat difficult          Review of Systems  Musculoskeletal:        Left chest pain area  All other systems reviewed and are negative.     Objective:   Physical Exam Gen: no distress, normal appearing HEENT: oral mucosa pink and moist, NCAT Chest: normal effort, normal rate of breathing Abd: soft, non-distended Ext: no edema Psych: pleasant, normal affect Skin: intact Neuro: Awake and alert, cranial nerves II through XII grossly intact, follows commands Sensation intact light touch in all 4 extremities Strength 5 out of 5 in all 4 extremities Tremor with movements in bilateral upper extremities Musculoskeletal: No joint swelling or tenderness noted, no paraspinal tenderness in the cervical lumbar thoracic spine Allodynia over anterior L chest wall       Assessment & Plan:   Intercostal neuralgia -She did not tolerate gabapentin or capsaicin cream.  Not sure that she will tolerate Qutenza well -Internal shingles or zoster sine herpete likely cause this condition is often hard to diagnose due to absence of shingles rash.  Other causes of nerve inflammation or compression could be possible -Pain has been improving gradually and she reports response to amitriptyline and CBD cream.   -She has stopped amitriptyline  50 mg at bedtime, she thinks it might be causing sleepiness  -Will start duloxetine '20mg'$ , she would like to start with low dose -hold off on interventional techniques such as intercostal  nerve block for now -Consider Lyrica at a later time if needed

## 2022-07-02 ENCOUNTER — Other Ambulatory Visit: Payer: Self-pay | Admitting: Family Medicine

## 2022-07-02 DIAGNOSIS — M5414 Radiculopathy, thoracic region: Secondary | ICD-10-CM

## 2022-07-07 ENCOUNTER — Other Ambulatory Visit: Payer: Self-pay | Admitting: Family Medicine

## 2022-07-07 DIAGNOSIS — G250 Essential tremor: Secondary | ICD-10-CM

## 2022-07-21 ENCOUNTER — Ambulatory Visit
Admission: RE | Admit: 2022-07-21 | Discharge: 2022-07-21 | Disposition: A | Discharge status: Home / Self Care | Payer: Medicare Other | Source: Ambulatory Visit | Attending: Nurse Practitioner | Admitting: Nurse Practitioner

## 2022-07-21 ENCOUNTER — Other Ambulatory Visit: Payer: Self-pay | Admitting: Nurse Practitioner

## 2022-07-21 DIAGNOSIS — Z170 Estrogen receptor positive status [ER+]: Secondary | ICD-10-CM

## 2022-07-21 DIAGNOSIS — Z853 Personal history of malignant neoplasm of breast: Secondary | ICD-10-CM | POA: Diagnosis not present

## 2022-07-21 DIAGNOSIS — N644 Mastodynia: Secondary | ICD-10-CM | POA: Diagnosis not present

## 2022-07-26 DIAGNOSIS — M5451 Vertebrogenic low back pain: Secondary | ICD-10-CM | POA: Diagnosis not present

## 2022-07-28 ENCOUNTER — Encounter: Payer: Self-pay | Admitting: Physical Medicine & Rehabilitation

## 2022-07-28 ENCOUNTER — Encounter: Payer: Medicare Other | Attending: Physical Medicine & Rehabilitation | Admitting: Physical Medicine & Rehabilitation

## 2022-07-28 VITALS — BP 168/80 | HR 70 | Ht 64.0 in | Wt 177.0 lb

## 2022-07-28 DIAGNOSIS — G588 Other specified mononeuropathies: Secondary | ICD-10-CM | POA: Insufficient documentation

## 2022-07-28 DIAGNOSIS — M545 Low back pain, unspecified: Secondary | ICD-10-CM | POA: Insufficient documentation

## 2022-07-28 MED ORDER — DICLOFENAC SODIUM 1 % EX GEL: 4.0000 g | Freq: Four times a day (QID) | CUTANEOUS | 3 refills | Status: DC

## 2022-07-28 MED ORDER — METHOCARBAMOL 500 MG PO TABS: 500.0000 mg | ORAL_TABLET | Freq: Four times a day (QID) | ORAL | 1 refills | Status: DC | PRN

## 2022-07-28 NOTE — Progress Notes (Signed)
4  Subjective:    Patient ID: [name], female    DOB: 09-13-1933, 89 y.o.   MRN: [id number]  HPI HPI Rani Idler is a very pleasant 89 year old woman with past medical history of IBS, essential tremor, osteoarthritis of the hands, depression, breast cancer status post right lumpectomy who is here for R lumbectompy who is here for left chest wall pain and tenderness related to suspected thoracic neuritis.  Patient reports she developed pain in her left back and chest wall about 2 to 3 months ago.  She reports a stinging pain in this area that was tender to palpation.  Several weeks ago the pain moved more anteriorly and this hurts primarily from her left chest wall to under her left breast ending at her sternum.  Pain has been gradually improving.  She has allodynia in this area.  Her PCP suspected this was related to internal shingles.  She had chest and rib x-rays that did not show significant abnormality.  She also had a CT chest about a year ago 04/11/2021 that did not show disease in this area.  She was started on amitriptyline which has improved her pain.  She has not had any side effects with this medication.  CBD cream is also helpful.  Interval history Ms. Gauer is here for follow-up regarding her chronic pain.  She reports the pain around her chest continues to improve.  At this point she only has a small area on her breast that continues to be sensitive to touch.  She is not able to tolerate duloxetine because it made her shake.  She also did not tolerate amitriptyline or gabapentin in the past.  Lyrica made her "feel strange " when she took it previously.  Over the past few weeks she has developed pain in her back.  On 07/26/2022 about 2 days ago she was seen by [facility].  Had an x-ray of bilateral spine taken and was reported to have some degenerative disc disease L5-S1, spondylolisthesis? L4-L5.  On that day she also had right SI joint injection.  She has not been using any  over-the-counter medications such as Tylenol.  Pain is better when she sits on the left side.  She has not yet noted significant improvement in her pain.  Pain is worse when bending.   Pain Inventory Average Pain 2 Pain Right Now 2 My pain is intermittent and burning  In the last 24 hours, has pain interfered with the following? General activity 0 Relation with others 0 Enjoyment of life 0 What TIME of day is your pain at its worst? daytime Sleep (in general) Good  Pain is worse with: unsure Pain improves with:  . Relief from Meds: 0  Family History  Problem Relation Age of Onset   Stroke Mother    Kidney disease Father    Cancer Maternal Grandmother        Leukemia   Kidney failure Other        renal disease   Social History   Socioeconomic History   Marital status: Widowed    Spouse name: Not on file   Number of children: 3   Years of education: Not on file   Highest education level: Not on file  Occupational History   Not on file  Tobacco Use   Smoking status: Never   Smokeless tobacco: Never  Vaping Use   Vaping Use: Never used  Substance and Sexual Activity   Alcohol use: Yes    Comment:  Rare   Drug use: No   Sexual activity: Not Currently  Other Topics Concern   Not on file  Social History Narrative   Not on file   Social Determinants of Health   Financial Resource Strain: Low Risk  (07/19/2021)   Overall Financial Resource Strain (CARDIA)    Difficulty of Paying Living Expenses: Not hard at all  Food Insecurity: No Food Insecurity (03/30/2021)   Hunger Vital Sign    Worried About Running Out of Food in the Last Year: Never true    Ran Out of Food in the Last Year: Never true  Transportation Needs: No Transportation Needs (07/19/2021)   PRAPARE - Hydrologist (Medical): No    Lack of Transportation (Non-Medical): No  Physical Activity: Inactive (07/19/2021)   Exercise Vital Sign    Days of Exercise per Week: 0 days     Minutes of Exercise per Session: 0 min  Stress: No Stress Concern Present (04/20/2020)   [place]    Feeling of Stress : Only a little  Social Connections: Moderately Isolated (07/19/2021)   Social Connection and Isolation Panel [NHANES]    Frequency of Communication with Friends and Family: Twice a week    Frequency of Social Gatherings with Friends and Family: Twice a week    Attends Religious Services: 1 to 4 times per year    Active Member of [employer] or Organizations: No    Attends Archivist Meetings: Never    Marital Status: Widowed   Past Surgical History:  Procedure Laterality Date   arthroscopy left knee     arthroscopy right knee     bmp  07/17/2000   BREAST BIOPSY Right 03/23/2021   x2   BREAST LUMPECTOMY Right 04/20/2021   BREAST LUMPECTOMY WITH RADIOACTIVE SEED AND SENTINEL LYMPH NODE BIOPSY Right 04/20/2021   Procedure: RIGHT BREAST LUMPECTOMY WITH RADIOACTIVE SEED AND SENTINEL LYMPH NODE BIOPSY;  Surgeon: Erroll Luna, MD;  Location: [place];  Service: General;  Laterality: Right;   CATARACT EXTRACTION, BILATERAL     COLONOSCOPY W/ POLYPECTOMY     DILATION AND CURETTAGE OF UTERUS     RIGHT FOOT SURGERIES X 2     TOTAL KNEE ARTHROPLASTY  03/13/2012   Procedure: TOTAL KNEE ARTHROPLASTY;  Surgeon: Gearlean Alf, MD;  [facility];  Service: Orthopedics;  Laterality: Left;  steroid injection right knee   TOTAL KNEE ARTHROPLASTY Right 08/04/2013   Procedure: RIGHT TOTAL KNEE ARTHROPLASTY;  Surgeon: Gearlean Alf, MD;  Location[facility];  Service: Orthopedics;  Laterality: Right;   Past Surgical History:  Procedure Laterality Date   arthroscopy left knee     arthroscopy right knee     bmp  07/17/2000   BREAST BIOPSY Right 03/23/2021   x2   BREAST LUMPECTOMY Right 04/20/2021   BREAST LUMPECTOMY WITH RADIOACTIVE SEED AND SENTINEL LYMPH NODE BIOPSY Right 04/20/2021    Procedure: RIGHT BREAST LUMPECTOMY WITH RADIOACTIVE SEED AND SENTINEL LYMPH NODE BIOPSY;  Surgeon: Erroll Luna, MD;  Location: [place];  Service: General;  Laterality: Right;   CATARACT EXTRACTION, BILATERAL     COLONOSCOPY W/ POLYPECTOMY     DILATION AND CURETTAGE OF UTERUS     RIGHT FOOT SURGERIES X 2     TOTAL KNEE ARTHROPLASTY  03/13/2012   Procedure: TOTAL KNEE ARTHROPLASTY;  Surgeon: Gearlean Alf, MD;  [facility];  Service: Orthopedics;  Laterality: Left;  steroid injection right knee   TOTAL KNEE ARTHROPLASTY Right 08/04/2013   Procedure: RIGHT TOTAL KNEE ARTHROPLASTY;  Surgeon: Gearlean Alf, MD;  [facility];  Service: Orthopedics;  Laterality: Right;   Past Medical History:  Diagnosis Date   Abnormal results of liver function studies 05/15/2007   Allergy    Anal fissure 09/26/2007   Formatting of this note might be different from the original. Qualifier: Diagnosis of  By: Nils Pyle CMA (AAMA), Leisha   Anxiety    B12 deficiency 05/05/2020   Chicken pox    Essential tremor    of head, sometimes hands.  pt takes primidone to help the tremors-DR. MILLER-NEUROLOGIST IN [place].  PT WAS STARTED ON VALUIM MANY YEARS AGO FOR HER TREMORS--AND CONTINUES TO TAKE.   Fracture of humerus, proximal, left, closed 10/28/2011   pt has finished physical therapy-limited ROM reaching to her back and unable to lift heavy things with left hand/arm   GERD (gastroesophageal reflux disease)    H/O hiatal hernia    Hereditary and idiopathic peripheral neuropathy 03/27/2014   Hx of colonic polyps    Osteoarthritis    PAIN AND OA BILATERAL KNEES; ALSO HAS ARTHRITIS IN HANDS AND BACK BUT NO BACK PAIN   Other chronic otitis externa 03/01/2009   Formatting of this note might be different from the original. Qualifier: Diagnosis of  By: Arnoldo Morale MD, John E   BP (!) 168/80   Pulse 70   Ht '5\' 4"'$  (1.626 m)   Wt 177 lb (80.3 kg)   SpO2 97%   BMI 30.38 kg/m   Opioid  Risk Score:   Fall Risk Score:  `1  Depression screen PHQ 2/9     05/23/2022   11:00 AM 07/19/2021    1:04 PM 07/19/2021   12:57 PM 05/12/2021   10:40 AM 04/20/2020   11:28 AM 11/13/2017    1:14 PM  Depression screen PHQ 2/9  Decreased Interest 2 0 0 0 0 0  Down, Depressed, Hopeless 0 0 0 1 0 0  PHQ - 2 Score 2 0 0 1 0 0  Altered sleeping 0       Tired, decreased energy 2       Change in appetite 2       Feeling bad or failure about yourself  1       Trouble concentrating 2       Moving slowly or fidgety/restless 0       Suicidal thoughts 0       PHQ-9 Score 9       Difficult doing work/chores Somewhat difficult          Review of Systems  Musculoskeletal:        Left breast  All other systems reviewed and are negative.     Objective:   Physical Exam  Physical Exam Gen: no distress, normal appearing HEENT: oral mucosa pink and moist, NCAT Abd: soft, non-distended Ext: no edema Psych: pleasant, normal affect Skin: intact Neuro: Awake and alert, cranial nerves II through XII grossly intact, follows commands Sensation intact light touch in all 4 extremities Strength 5 out of 5 in all 4 extremities Tremor with movements in bilateral upper extremities Musculoskeletal: No joint swelling or tenderness noted,      Xray report from [facility] 07/26/22  Three-view x-rays of the lumbar spine demonstrates a mild levorotatory scoliosis. Moderate disc degeneration L5-S1. Moderately severe at L2-3 calcification of the order a slight  spinal listhesis at L4-5 no fracture. Thoracic spondylosis.       Assessment & Plan:   Intercostal neuralgia -Pain continues to improve locally.  She has not been able to tolerate a medications for neuropathic pain -Has not tolerated duloxetine, amitriptyline, gabapentin, Lyrica -hold off on interventional techniques such as intercostal nerve block for now as pain appears to be improving   Lower back pain, right-sided, lumbar and thoracic  spondylosis noted on imaging from [facility] -Recently had imaging and right SI joint injection by [facility], likely will need a few days to see if this will improve her pain. -Advised to try Tylenol as needed -Advised to try TENS unit, she thinks she has access to one currently -Order Voltaren gel -Will order Robaxin 500 mg as needed -Discussed TENS unit will order Xanax device

## 2022-08-14 ENCOUNTER — Ambulatory Visit: Payer: Medicare Other | Admitting: Family Medicine

## 2022-08-15 ENCOUNTER — Ambulatory Visit (INDEPENDENT_AMBULATORY_CARE_PROVIDER_SITE_OTHER): Payer: Medicare Other | Admitting: Family Medicine

## 2022-08-15 ENCOUNTER — Encounter: Payer: Self-pay | Admitting: Family Medicine

## 2022-08-15 VITALS — BP 134/76 | HR 86 | Temp 97.4°F | Ht 64.0 in | Wt 175.2 lb

## 2022-08-15 DIAGNOSIS — G8929 Other chronic pain: Secondary | ICD-10-CM

## 2022-08-15 DIAGNOSIS — M5414 Radiculopathy, thoracic region: Secondary | ICD-10-CM

## 2022-08-15 DIAGNOSIS — M5441 Lumbago with sciatica, right side: Secondary | ICD-10-CM

## 2022-08-15 DIAGNOSIS — K219 Gastro-esophageal reflux disease without esophagitis: Secondary | ICD-10-CM

## 2022-08-15 DIAGNOSIS — G250 Essential tremor: Secondary | ICD-10-CM

## 2022-08-15 MED ORDER — PRIMIDONE 250 MG PO TABS: 250.0000 mg | ORAL_TABLET | Freq: Three times a day (TID) | ORAL | 3 refills | Status: DC

## 2022-08-15 MED ORDER — DIAZEPAM 5 MG PO TABS: ORAL_TABLET | ORAL | 1 refills | Status: DC

## 2022-08-15 MED ORDER — OMEPRAZOLE 20 MG PO CPDR: 20.0000 mg | DELAYED_RELEASE_CAPSULE | Freq: Every day | ORAL | 3 refills | Status: DC

## 2022-08-15 NOTE — Assessment & Plan Note (Signed)
Stable on omeprazole 20 mg daily. I will renew her meds.

## 2022-08-15 NOTE — Assessment & Plan Note (Signed)
I recommend she reach back out to Dr. Tonita Cong and let him know that the SI joint injection was not helpful.

## 2022-08-15 NOTE — Assessment & Plan Note (Signed)
Stable. Continue diazepam 5 mg TID and primidone 250 mg TID

## 2022-08-15 NOTE — Progress Notes (Signed)
[facility] [address] [place] [address] Dept: [phone] Dept Fax: [phone]  Chronic Care Office Visit  Subjective:    Patient ID: [name], female    DOB: 1934/04/18, 89 y.o..   MRN: [id number]  Chief Complaint  Patient presents with   Follow-up    3 month FU, concerns with Robaxin interacting with current medications    History of Present Illness:  Patient is in today for reassessment of chronic medical issues.  [name] has a history of essential tremor. She is managed on diazepam 5 mg TID and primidone 250 mg TID. Overall, she feels her tremor is manageable.  [name] had been seeing me this fall/winter with an issue of left flank/chest pain. This has been in painful along an area overlying the 8th-9th ribs. The pain initially started int eh back and gradually shifted around anteriorly. She was tried on gabapentin, but this made her feel drunk (? reaction along with primidone). Topical capsaicin was not tolerable to her. We eventually settled on a regimen of amitriptyline 25 mg daily and CBD ointment. I referred her to physiatry. Dr. Curlene Dolphin felt she may have zoster sin herpete. She was tried on Cymbalta, but had worse shakes with this.  Now [name] tells me her pain has resolved and is no longer bothering her.  [name] has a history of low back pain with sciatica. She was seen by Dr. Tonita Cong and given an SI joint injection. She feels this has not helped at this point.   Past Medical History: Patient Active Problem List   Diagnosis Date Noted   Chronic right-sided low back pain with right-sided sciatica 08/15/2022   Postherpetic neuralgia 04/05/2022   History of breast cancer, right 08/12/2021   Adrenal nodule- likely benign adenoma ([place]) 04/12/2021   Aortic atherosclerosis ([place]) 04/12/2021   Malignant neoplasm of upper-outer quadrant of right breast in female, estrogen receptor positive  ([place]) 03/28/2021   Hyperlipidemia 03/18/2021   Invasive ductal carcinoma of breast, female, right ([place]) 02/28/2021   Vitamin D deficiency 08/05/2019   Elevated blood pressure reading without diagnosis of hypertension 04/30/2019   Allergic rhinitis 10/28/2018   OSA (obstructive sleep apnea) 11/28/2017   Iron overload 02/21/2016   Senile nuclear sclerosis 12/10/2014   Gait difficulty 03/27/2014   Osteopenia 10/31/2012   Gastro-esophageal reflux disease without esophagitis 06/25/2012   Fecal incontinence 06/25/2012   Thoracic radiculitis 07/20/2010   Diverticulosis of colon 09/26/2007   Depression with anxiety 09/26/2007   Enlarged lymph nodes 07/26/2007   History of colonic polyps 05/15/2007   Irritable bowel syndrome 05/15/2007   Tremor, essential 02/11/2007   Osteoarthritis of both hands 02/11/2007   Past Surgical History:  Procedure Laterality Date   arthroscopy left knee     arthroscopy right knee     bmp  07/17/2000   BREAST BIOPSY Right 03/23/2021   x2   BREAST LUMPECTOMY Right 04/20/2021   BREAST LUMPECTOMY WITH RADIOACTIVE SEED AND SENTINEL LYMPH NODE BIOPSY Right 04/20/2021   Procedure: RIGHT BREAST LUMPECTOMY WITH RADIOACTIVE SEED AND SENTINEL LYMPH NODE BIOPSY;  Surgeon: Erroll Luna, MD;  Location: [facility];  Service: General;  Laterality: Right;   CATARACT EXTRACTION, BILATERAL     COLONOSCOPY W/ POLYPECTOMY     DILATION AND CURETTAGE OF UTERUS     RIGHT FOOT SURGERIES X 2     TOTAL KNEE ARTHROPLASTY  03/13/2012   Procedure: TOTAL KNEE ARTHROPLASTY;  Surgeon: Dione Plover  Aluisio, MD;  [facility];  Service: Orthopedics;  Laterality: Left;  steroid injection right knee   TOTAL KNEE ARTHROPLASTY Right 08/04/2013   Procedure: RIGHT TOTAL KNEE ARTHROPLASTY;  Surgeon: Gearlean Alf, MD;  [facility];  Service: Orthopedics;  Laterality: Right;   Family History  Problem Relation Age of Onset   Stroke Mother    Kidney disease Father     Cancer Maternal Grandmother        Leukemia   Kidney failure Other        renal disease   Outpatient Medications Prior to Visit  Medication Sig Dispense Refill   cholecalciferol (VITAMIN D3) 25 MCG (1000 UNIT) tablet Take 1,000 Units by mouth daily.     diclofenac Sodium (VOLTAREN ARTHRITIS PAIN) 1 % GEL Apply 4 g topically 4 (four) times daily. SI joint 150 g 3   hyoscyamine (LEVSIN) 0.125 MG tablet Take 1 tablet (0.125 mg total) by mouth every 4 (four) hours as needed for cramping. 60 tablet 1   lidocaine 4 % Place 1 patch onto the skin daily. 30 patch 2   methocarbamol (ROBAXIN) 500 MG tablet Take 1 tablet (500 mg total) by mouth every 6 (six) hours as needed for muscle spasms. 60 tablet 1   montelukast (SINGULAIR) 10 MG tablet Take 1 tablet (10 mg total) by mouth at bedtime. 30 tablet 11   Multiple Vitamin (MULTIVITAMIN) tablet Take 1 tablet by mouth daily.     diazepam (VALIUM) 5 MG tablet TAKE 1 TABLET(5 MG) BY MOUTH EVERY 8 HOURS AS NEEDED FOR ANXIETY 90 tablet 1   omeprazole (PRILOSEC) 20 MG capsule Take 1 capsule (20 mg total) by mouth daily. 90 capsule 3   primidone (MYSOLINE) 250 MG tablet Take 1 tablet (250 mg total) by mouth 3 (three) times daily. 270 tablet 3   No facility-administered medications prior to visit.   Allergies  Allergen Reactions   Memantine Swelling   Propranolol Other (See Comments)    Made her head feel very funny Made her head feel very funny    Codeine Nausea And Vomiting   Duloxetine     Tremor    Gabapentin (Once-Daily)     Made her feel funny.   Lyrica [Pregabalin]     Made her feel funny   Sulfamethoxazole Rash    REACTION: unspecified     Objective:   Today's Vitals   08/15/22 1338  BP: 134/76  Pulse: 86  Temp: (!) 97.4 F (36.3 C)  TempSrc: Tympanic  SpO2: 95%  Weight: 175 lb 3.2 oz (79.5 kg)  Height: '5\' 4"'$  (1.626 m)   Body mass index is 30.07 kg/m.   General: Well developed, well nourished. No acute distress. Psych: Alert  and oriented. Normal mood and affect.  Health Maintenance Due  Topic Date Due   Zoster Vaccines- Shingrix (1 of 2) Never done   DTaP/Tdap/Td (2 - Tdap) 07/17/2002   COVID-19 Vaccine (3 - Pfizer risk series) 11/24/2019   Medicare Annual Wellness (AWV)  07/19/2022     Assessment & Plan:   Problem List Items Addressed This Visit       Digestive   Gastro-esophageal reflux disease without esophagitis    Stable on omeprazole 20 mg daily. I will renew her meds.      Relevant Medications   omeprazole (PRILOSEC) 20 MG capsule     Nervous and Auditory   Tremor, essential    Stable. Continue diazepam 5 mg TID and primidone 250 mg  TID      Relevant Medications   diazepam (VALIUM) 5 MG tablet   primidone (MYSOLINE) 250 MG tablet   Chronic right-sided low back pain with right-sided sciatica    I recommend she reach back out to Dr. Tonita Cong and let him know that the SI joint injection was not helpful.      Relevant Medications   diazepam (VALIUM) 5 MG tablet   primidone (MYSOLINE) 250 MG tablet   Other Visit Diagnoses     Thoracic neuritis    -  Primary   Resolved. May have been due to zoster sin herpete.   Relevant Medications   diazepam (VALIUM) 5 MG tablet   primidone (MYSOLINE) 250 MG tablet       Return in about 6 months (around 02/13/2023) for Reassessment.   [name], MD

## 2022-08-25 ENCOUNTER — Ambulatory Visit: Payer: Medicare Other | Admitting: Physical Medicine & Rehabilitation

## 2022-09-04 ENCOUNTER — Telehealth: Payer: Self-pay | Admitting: Family Medicine

## 2022-09-04 NOTE — Telephone Encounter (Signed)
Contacted MCKINSLEY GONCE to schedule their annual wellness visit. Appointment made for 09/15/22.  [facility] direct phone # [phone]

## 2022-09-11 ENCOUNTER — Ambulatory Visit: Payer: Medicare Other

## 2022-09-24 NOTE — Therapy (Signed)
OUTPATIENT PHYSICAL THERAPY SOZO SCREENING NOTE   Patient Name: [name] MRN: [id number] DOB:Mar 30, 1934, 89 y.o., female Today's Date: 09/25/2022  PCP: [name], MD REFERRING PROVIDER: Erroll Luna, MD     Past Medical History:  Diagnosis Date   Abnormal results of liver function studies 05/15/2007   Allergy    Anal fissure 09/26/2007   Formatting of this note might be different from the original. Qualifier: Diagnosis of  By: Nils Pyle CMA (AAMA), Leisha   Anxiety    B12 deficiency 05/05/2020   Chicken pox    Essential tremor    of head, sometimes hands.  pt takes primidone to help the tremors-DR. MILLER-NEUROLOGIST IN [place].  PT WAS STARTED ON VALUIM MANY YEARS AGO FOR HER TREMORS--AND CONTINUES TO TAKE.   Fracture of humerus, proximal, left, closed 10/28/2011   pt has finished physical therapy-limited ROM reaching to her back and unable to lift heavy things with left hand/arm   GERD (gastroesophageal reflux disease)    H/O hiatal hernia    Hereditary and idiopathic peripheral neuropathy 03/27/2014   Hx of colonic polyps    Osteoarthritis    PAIN AND OA BILATERAL KNEES; ALSO HAS ARTHRITIS IN HANDS AND BACK BUT NO BACK PAIN   Other chronic otitis externa 03/01/2009   Formatting of this note might be different from the original. Qualifier: Diagnosis of  By: Arnoldo Morale MD, Balinda Quails   Past Surgical History:  Procedure Laterality Date   arthroscopy left knee     arthroscopy right knee     bmp  07/17/2000   BREAST BIOPSY Right 03/23/2021   x2   BREAST LUMPECTOMY Right 04/20/2021   BREAST LUMPECTOMY WITH RADIOACTIVE SEED AND SENTINEL LYMPH NODE BIOPSY Right 04/20/2021   Procedure: RIGHT BREAST LUMPECTOMY WITH RADIOACTIVE SEED AND SENTINEL LYMPH NODE BIOPSY;  Surgeon: Erroll Luna, MD;  Location: [facility];  Service: General;  Laterality: Right;   CATARACT EXTRACTION, BILATERAL     COLONOSCOPY W/ POLYPECTOMY     DILATION AND CURETTAGE OF  UTERUS     RIGHT FOOT SURGERIES X 2     TOTAL KNEE ARTHROPLASTY  03/13/2012   Procedure: TOTAL KNEE ARTHROPLASTY;  Surgeon: Gearlean Alf, MD;  [facility];  Service: Orthopedics;  Laterality: Left;  steroid injection right knee   TOTAL KNEE ARTHROPLASTY Right 08/04/2013   Procedure: RIGHT TOTAL KNEE ARTHROPLASTY;  Surgeon: Gearlean Alf, MD;  [facility];  Service: Orthopedics;  Laterality: Right;   Patient Active Problem List   Diagnosis Date Noted   Chronic right-sided low back pain with right-sided sciatica 08/15/2022   Postherpetic neuralgia 04/05/2022   History of breast cancer, right 08/12/2021   Adrenal nodule- likely benign adenoma ([place]) 04/12/2021   Aortic atherosclerosis ([place]) 04/12/2021   Malignant neoplasm of upper-outer quadrant of right breast in female, estrogen receptor positive ([place]) 03/28/2021   Hyperlipidemia 03/18/2021   Invasive ductal carcinoma of breast, female, right ([place]) 02/28/2021   Vitamin D deficiency 08/05/2019   Elevated blood pressure reading without diagnosis of hypertension 04/30/2019   Allergic rhinitis 10/28/2018   OSA (obstructive sleep apnea) 11/28/2017   Iron overload 02/21/2016   Senile nuclear sclerosis 12/10/2014   Gait difficulty 03/27/2014   Osteopenia 10/31/2012   Gastro-esophageal reflux disease without esophagitis 06/25/2012   Fecal incontinence 06/25/2012   Thoracic radiculitis 07/20/2010   Diverticulosis of colon 09/26/2007   Depression with anxiety 09/26/2007   Enlarged lymph nodes 07/26/2007   History of colonic polyps  05/15/2007   Irritable bowel syndrome 05/15/2007   Tremor, essential 02/11/2007   Osteoarthritis of both hands 02/11/2007    REFERRING DIAG: right breast cancer at risk for lymphedema  THERAPY DIAG: No diagnosis found.  PERTINENT HISTORY: Patient was diagnosed on 02/28/2021 with right grade II invasive ductal carcinoma breast cancer. She underwent a right lumpectomy and sentinel node biopsy (5  negative nodes) on 04/20/2021. It is ER/PR positive and HER2 negative with a Ki67 of 1%. She had bilateral knee replacements 10 years ago.   PRECAUTIONS: right UE Lymphedema risk, None  SUBJECTIVE: Pt returns for her 3 month L-Dex screen.   PAIN:  Are you having pain? No  SOZO SCREENING: Patient was assessed today using the SOZO machine to determine the lymphedema index score. This was compared to her baseline score. It was determined that she is within the recommended range when compared to her baseline and no further action is needed at this time. She will continue SOZO screenings. These are done every 3 months for 2 years post operatively followed by every 6 months for 2 years, and then annually.  Every 3 months until 04/21/23.     Stark Bray, PT 09/25/2022, 3:29 PM

## 2022-09-25 ENCOUNTER — Ambulatory Visit: Payer: Medicare Other | Attending: Surgery | Admitting: Rehabilitation

## 2022-09-25 DIAGNOSIS — Z170 Estrogen receptor positive status [ER+]: Secondary | ICD-10-CM | POA: Insufficient documentation

## 2022-09-25 DIAGNOSIS — Z483 Aftercare following surgery for neoplasm: Secondary | ICD-10-CM | POA: Insufficient documentation

## 2022-09-25 DIAGNOSIS — R293 Abnormal posture: Secondary | ICD-10-CM | POA: Insufficient documentation

## 2022-09-25 DIAGNOSIS — C50411 Malignant neoplasm of upper-outer quadrant of right female breast: Secondary | ICD-10-CM | POA: Insufficient documentation

## 2022-09-26 ENCOUNTER — Telehealth: Payer: Self-pay | Admitting: Family Medicine

## 2022-09-26 NOTE — Telephone Encounter (Signed)
Contacted [name] to schedule their annual wellness visit. Call back at later date: June 2024   [place] direct phone # [phone]

## 2022-09-27 IMAGING — MG DIGITAL DIAGNOSTIC BILAT W/ TOMO W/ CAD
8 series · 8 of 24 positions shown · non-contrast
Comparison: Previous exam(s).

CLINICAL DATA: Diffuse, nonfocal pain across the upper left breast,
outer left breast and extending into the upper back.



[R MLO synth-2D]
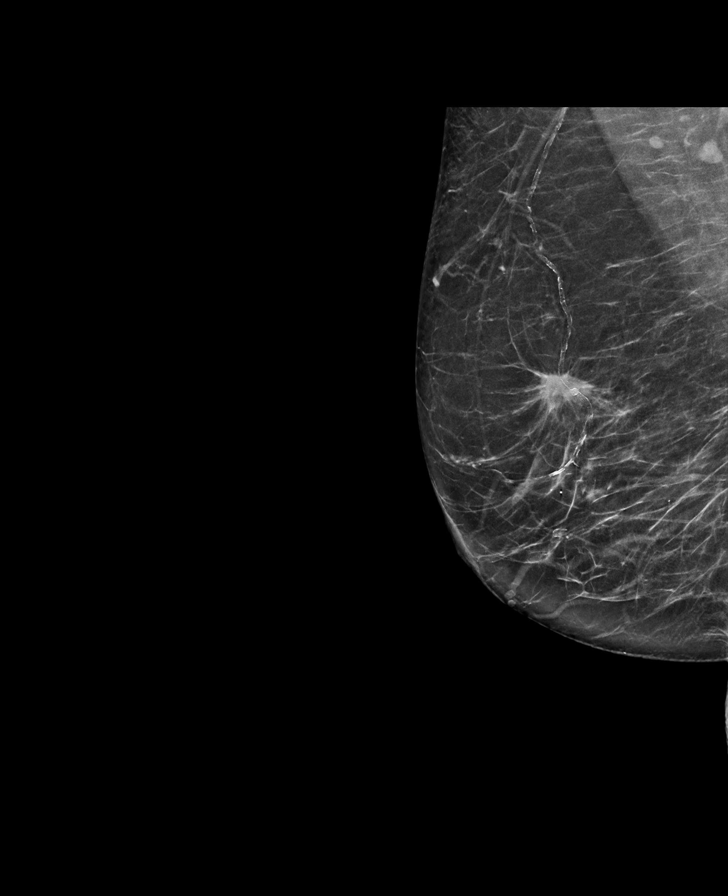

[L CC synth-2D]
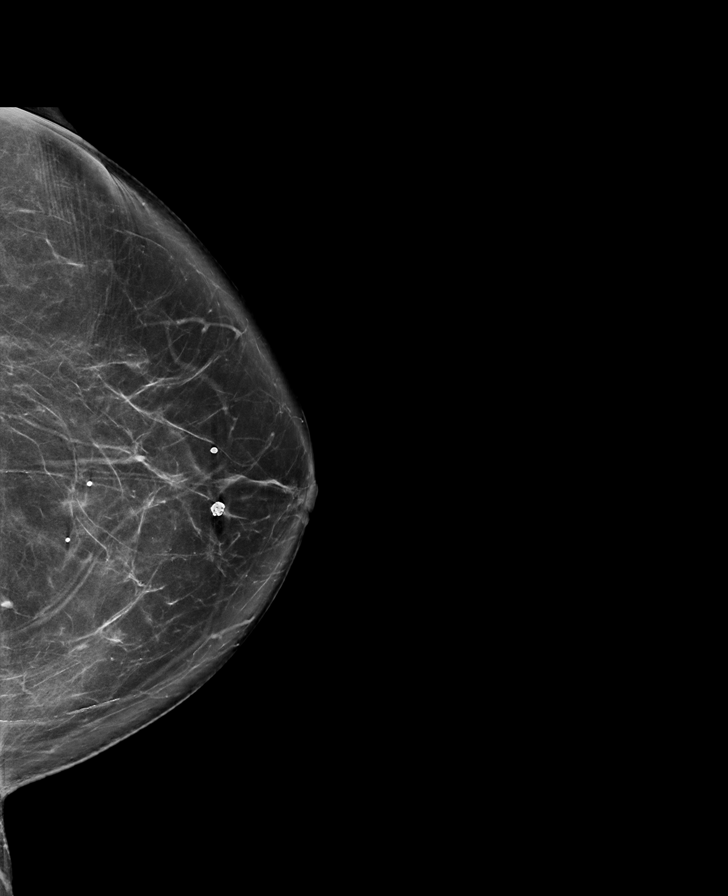

[L MLO synth-2D]
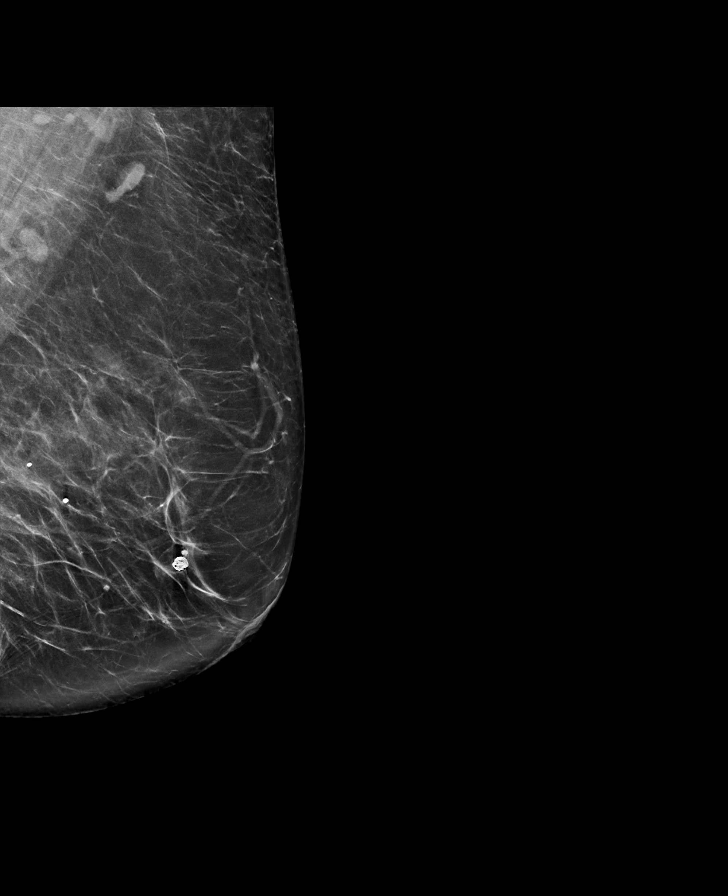

[R CC synth-2D]
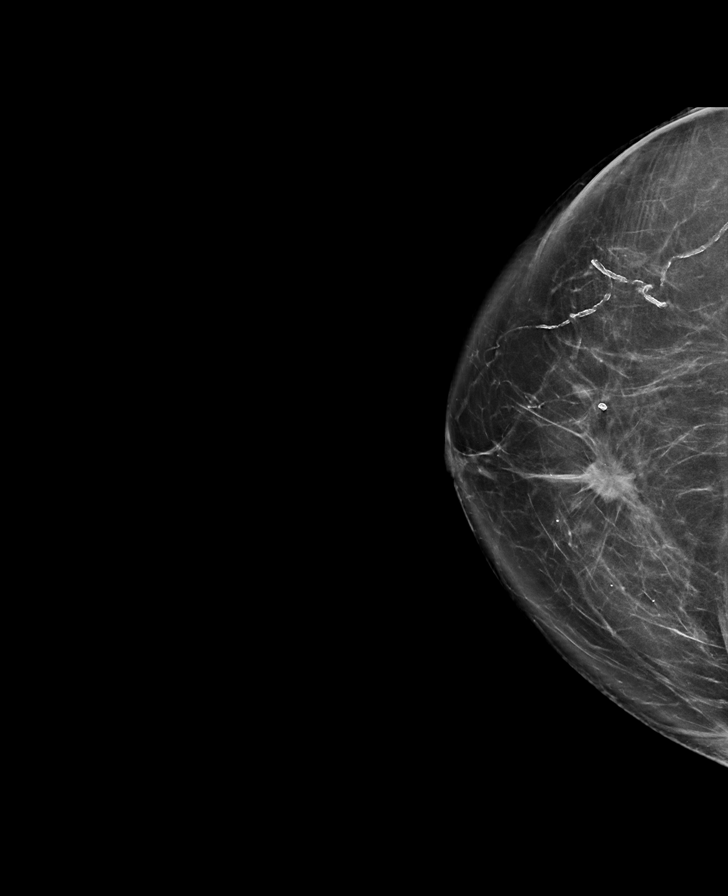

[L CC tomo · tomo slice 39/78.0]
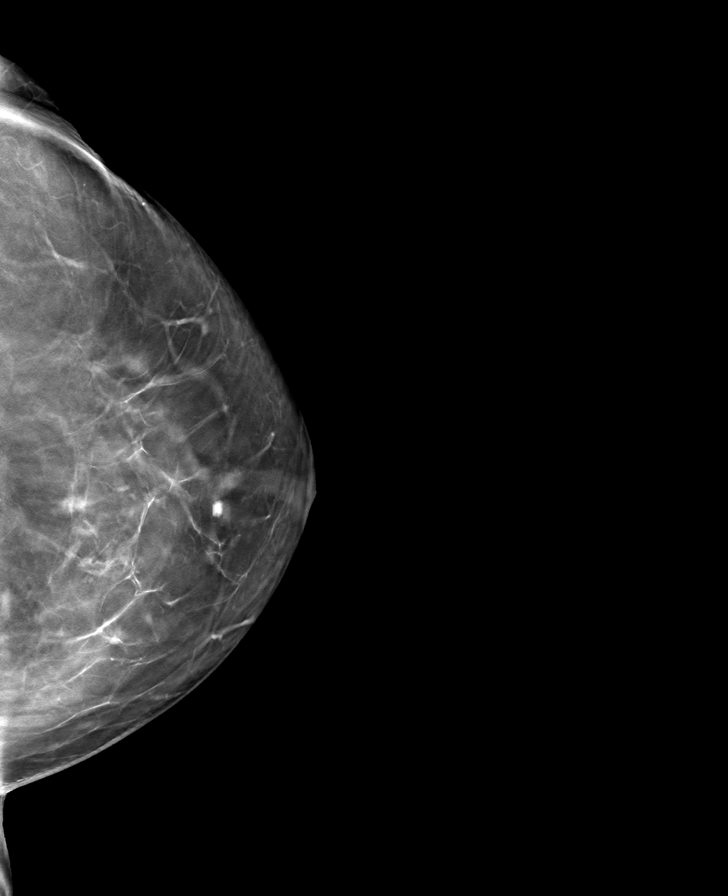

[R MLO tomo · tomo slice 36/71.0]
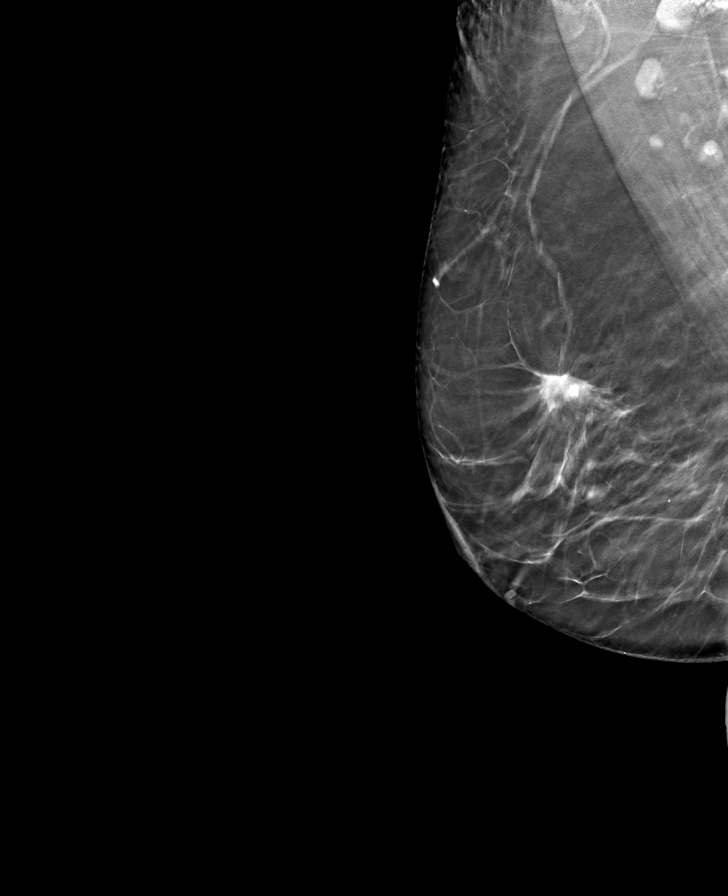

[L MLO tomo · tomo slice 39/78.0]
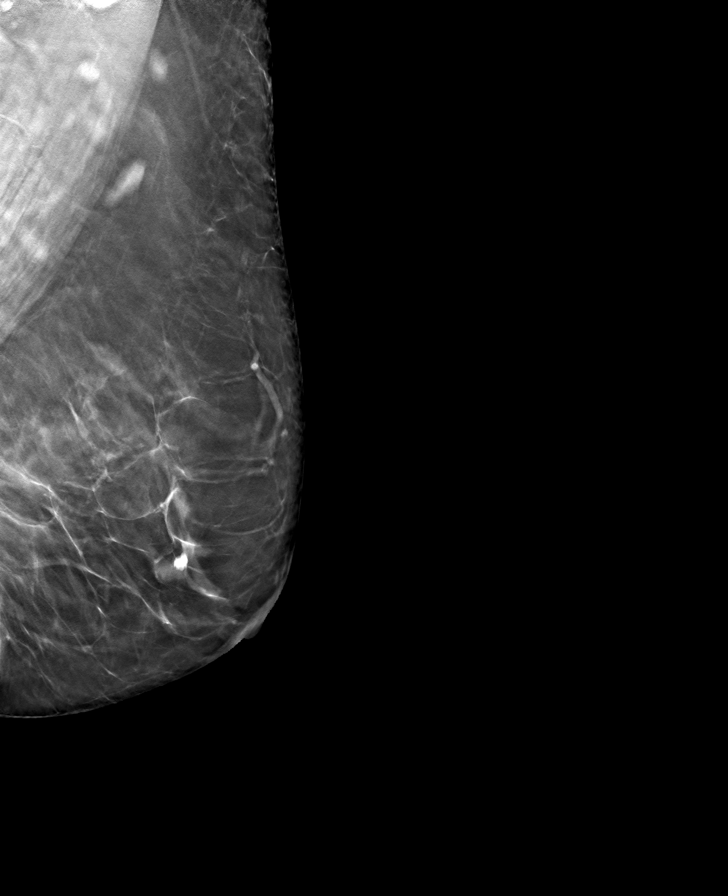

[R CC tomo · tomo slice 39/78.0]
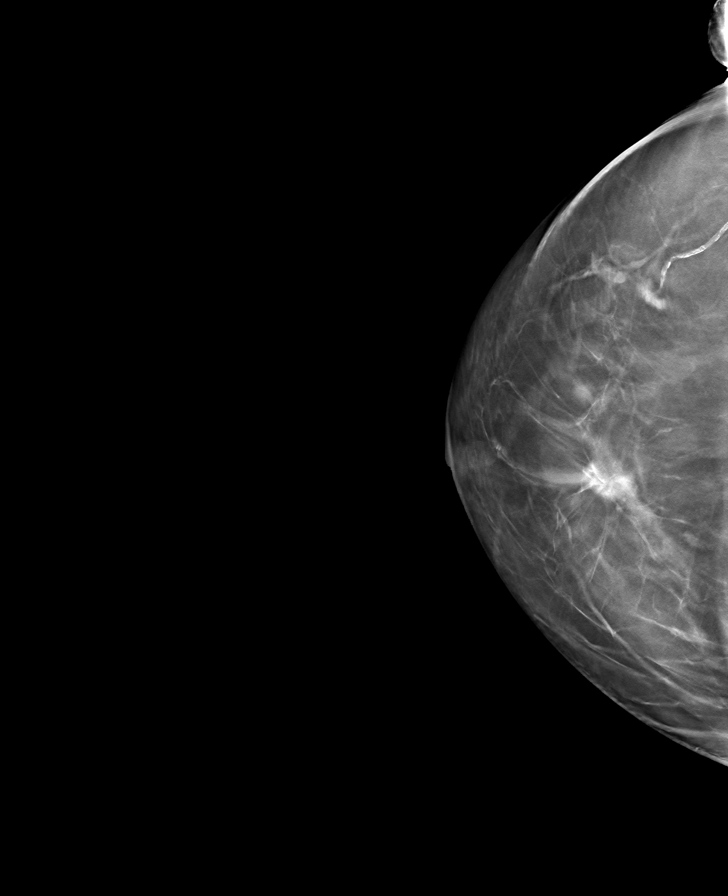

[8 of 24 positions shown; findings below may reference images not displayed]

ACR Breast Density Category b: There are scattered areas of
fibroglandular density.
FINDINGS: There is an elongated, oval asymmetry in posterior left breast in
the 12 o'clock position. There is a spiculated mass in the mid right
breast in the 12:30 o'clock position. The previously demonstrated
normal intramammary lymph node in the outer right breast is
unchanged since 10/06/2011. There are several right axillary lymph
nodes which are mildly larger with mildly thicker cortices compared
to the left axillary nodes.

On physical exam, there is an approximately 2 cm firm, palpable mass
in the 12 o'clock position of the right breast, 3 cm from the
nipple. No mass or palpable thickening in the upper left breast. No
palpable lymph nodes in either axilla.

Targeted ultrasound is performed, showing normal appearing breast
tissue throughout the upper left breast, including elongated
fibroglandular tissue, corresponding to the mammographic asymmetry.

There is a 1.5 x 1.5 x 1.1 cm irregular, hypoechoic mass with dense
posterior acoustical shadowing in the 12 o'clock position of the
right breast, 3 cm from nipple. This corresponds to the palpable and
mammographic mass.

Ultrasound of the right axilla demonstrated 2 lymph nodes with
borderline eccentric cortical thickening with a maximum thickness of
3.4 mm.
IMPRESSION: 1. 1.5 cm mass with imaging features highly suspicious for
malignancy in the 12 o'clock position of the right breast.
2. 2 right axillary lymph nodes with borderline eccentric cortical
thickening, suspicious for possible metastatic nodes.
3. No evidence of malignancy on the left.

RECOMMENDATION:
1. Ultrasound-guided core needle biopsy of the 1.5 cm mass in the 12
o'clock position of the right breast.
2. Ultrasound-guided core needle biopsy of 1 of the right axillary
lymph nodes with borderline eccentric cortical thickening. This has
been discussed with the patient and the biopsies have been scheduled
at [DATE] a.m. on 03/08/2021.

I have discussed the findings and recommendations with the patient.
If applicable, a reminder letter will be sent to the patient
regarding the next appointment.

BI-RADS CATEGORY  5: Highly suggestive of malignancy.

## 2022-09-27 IMAGING — US US BREAST*L* LIMITED INC AXILLA
1 series · 4 of 4 positions shown · non-contrast
Comparison: Previous exam(s).

CLINICAL DATA: Diffuse, nonfocal pain across the upper left breast,
outer left breast and extending into the upper back.



[Series 1: us breast*left* limited inc axilla · 0.07mm/px · 4 of 4 slices shown]
[im 1/4]
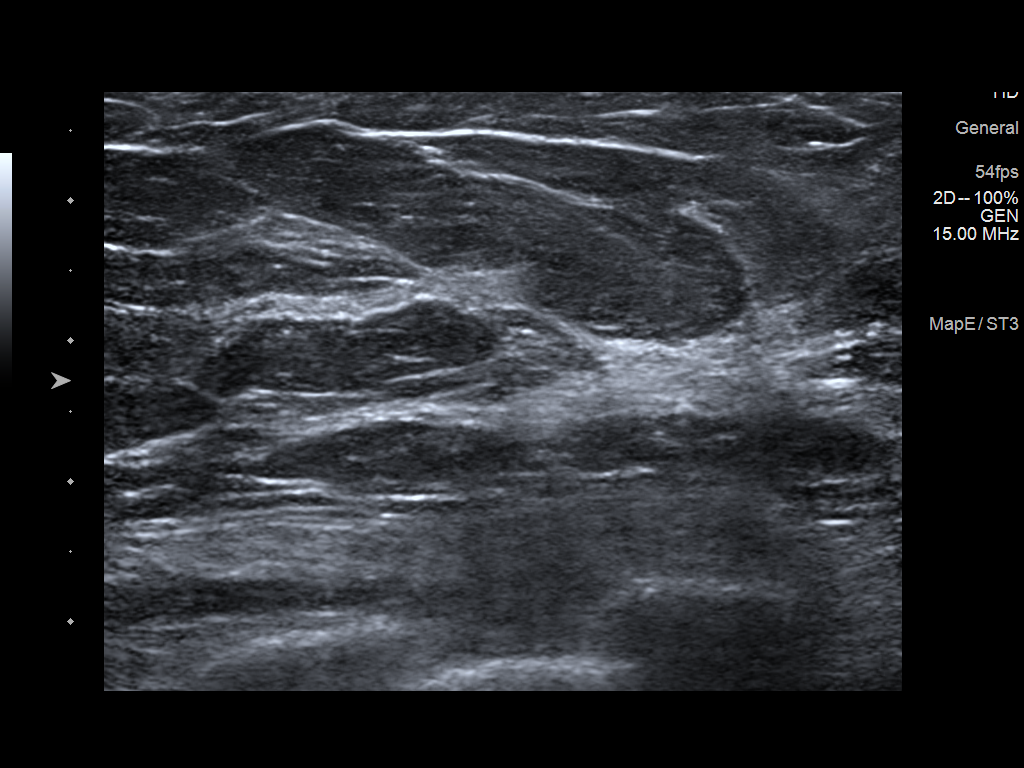
[im 2/4]
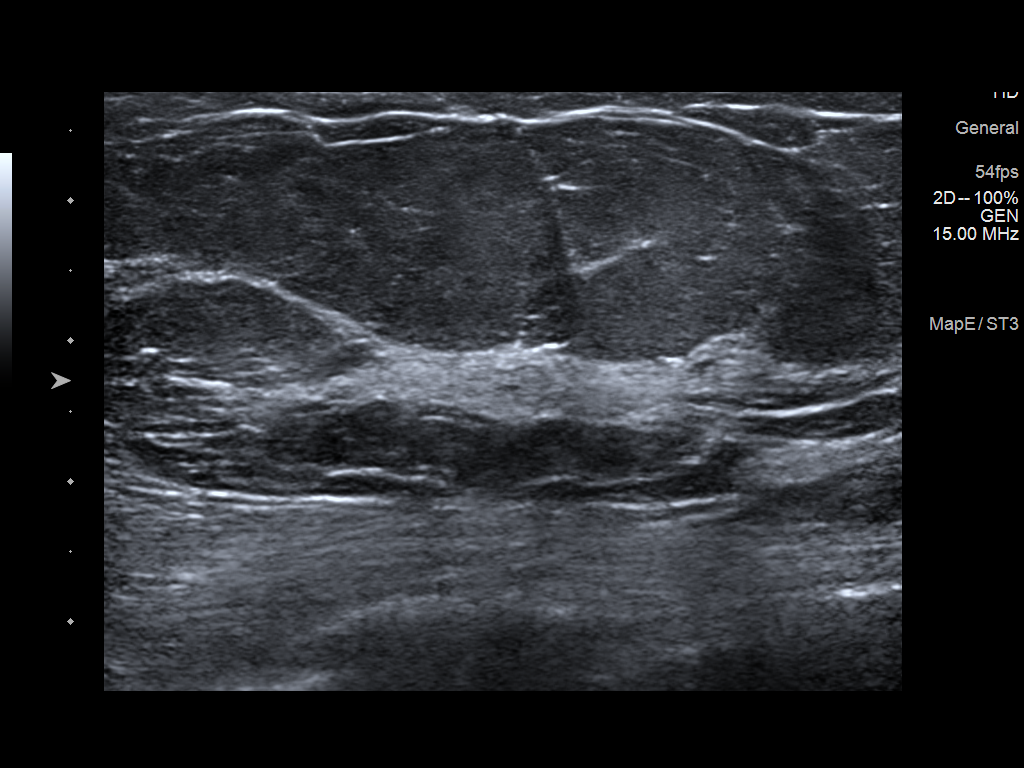
[im 3/4]
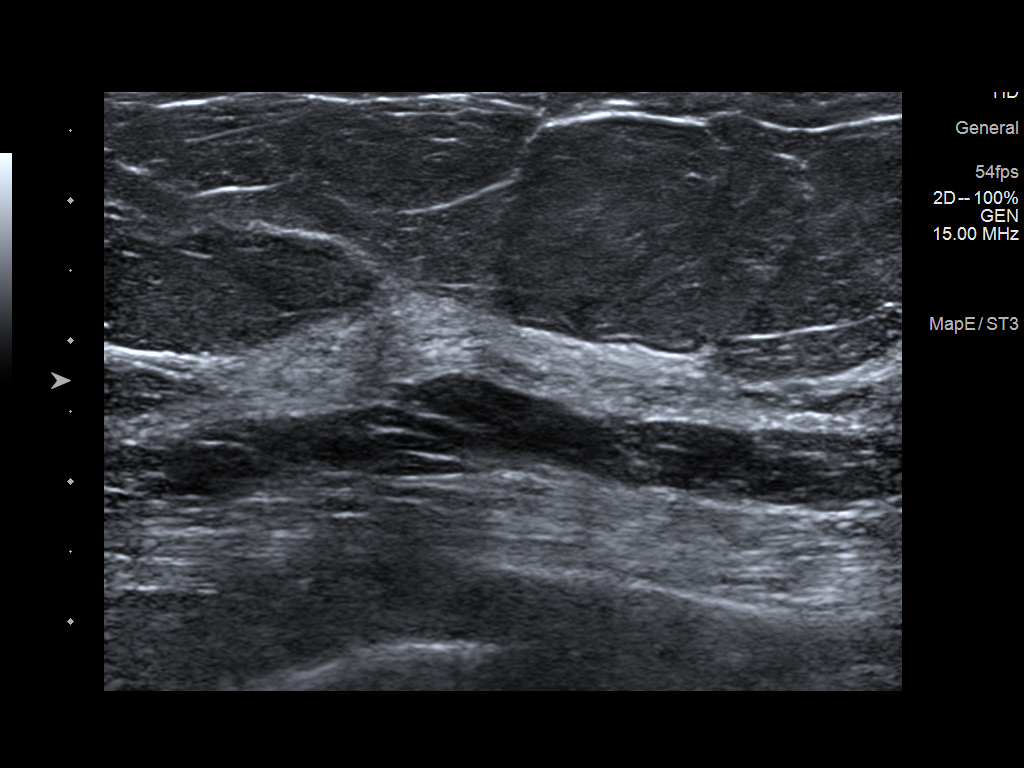
[im 4/4]
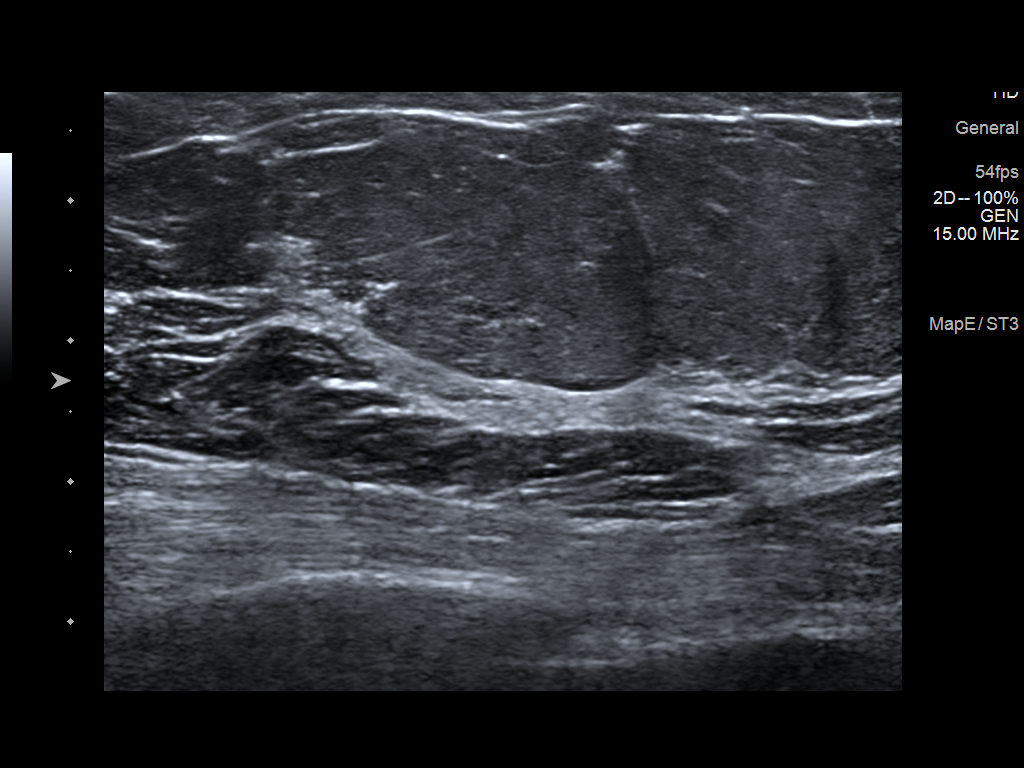

[4 of 4 positions shown; findings below may reference images not displayed]

ACR Breast Density Category b: There are scattered areas of
fibroglandular density.
FINDINGS: There is an elongated, oval asymmetry in posterior left breast in
the 12 o'clock position. There is a spiculated mass in the mid right
breast in the 12:30 o'clock position. The previously demonstrated
normal intramammary lymph node in the outer right breast is
unchanged since 10/06/2011. There are several right axillary lymph
nodes which are mildly larger with mildly thicker cortices compared
to the left axillary nodes.

On physical exam, there is an approximately 2 cm firm, palpable mass
in the 12 o'clock position of the right breast, 3 cm from the
nipple. No mass or palpable thickening in the upper left breast. No
palpable lymph nodes in either axilla.

Targeted ultrasound is performed, showing normal appearing breast
tissue throughout the upper left breast, including elongated
fibroglandular tissue, corresponding to the mammographic asymmetry.

There is a 1.5 x 1.5 x 1.1 cm irregular, hypoechoic mass with dense
posterior acoustical shadowing in the 12 o'clock position of the
right breast, 3 cm from nipple. This corresponds to the palpable and
mammographic mass.

Ultrasound of the right axilla demonstrated 2 lymph nodes with
borderline eccentric cortical thickening with a maximum thickness of
3.4 mm.
IMPRESSION: 1. 1.5 cm mass with imaging features highly suspicious for
malignancy in the 12 o'clock position of the right breast.
2. 2 right axillary lymph nodes with borderline eccentric cortical
thickening, suspicious for possible metastatic nodes.
3. No evidence of malignancy on the left.

RECOMMENDATION:
1. Ultrasound-guided core needle biopsy of the 1.5 cm mass in the 12
o'clock position of the right breast.
2. Ultrasound-guided core needle biopsy of 1 of the right axillary
lymph nodes with borderline eccentric cortical thickening. This has
been discussed with the patient and the biopsies have been scheduled
at [DATE] a.m. on 03/08/2021.

I have discussed the findings and recommendations with the patient.
If applicable, a reminder letter will be sent to the patient
regarding the next appointment.

BI-RADS CATEGORY  5: Highly suggestive of malignancy.

## 2022-10-20 ENCOUNTER — Other Ambulatory Visit: Payer: Self-pay | Admitting: Family Medicine

## 2022-10-20 DIAGNOSIS — G250 Essential tremor: Secondary | ICD-10-CM

## 2022-11-19 NOTE — Assessment & Plan Note (Signed)
Stage IA, p(T1c, N0), ER+/PR+/HER2-, Grade 2 -presented with left breast pain. S/p right lumpectomy on 04/20/21, pathology showed IDC, grade 2, 1.7 cm; DCIS; negative margins and lymph nodes. -Pt declined adjuvant tamoxifen, not a good candidate for AI.  -she is clinically doing well from a breast cancer standpoint. Labs reviewed, WNL. Physical exam was unremarkable. There is no clinical concern for recurrence. -Continue breast cancer surveillance

## 2022-11-20 ENCOUNTER — Inpatient Hospital Stay: Payer: Medicare Other | Attending: Hematology

## 2022-11-20 ENCOUNTER — Other Ambulatory Visit: Payer: Self-pay

## 2022-11-20 ENCOUNTER — Encounter: Payer: Self-pay | Admitting: Hematology

## 2022-11-20 ENCOUNTER — Inpatient Hospital Stay (HOSPITAL_BASED_OUTPATIENT_CLINIC_OR_DEPARTMENT_OTHER): Payer: Medicare Other | Admitting: Hematology

## 2022-11-20 VITALS — BP 149/57 | HR 76 | Temp 98.2°F | Resp 18 | Ht 64.0 in | Wt 179.1 lb

## 2022-11-20 DIAGNOSIS — Z170 Estrogen receptor positive status [ER+]: Secondary | ICD-10-CM

## 2022-11-20 DIAGNOSIS — C50411 Malignant neoplasm of upper-outer quadrant of right female breast: Secondary | ICD-10-CM

## 2022-11-20 DIAGNOSIS — Z853 Personal history of malignant neoplasm of breast: Secondary | ICD-10-CM | POA: Diagnosis not present

## 2022-11-20 LAB — CBC WITH DIFFERENTIAL (CANCER CENTER ONLY)
Abs Immature Granulocytes: 0.02 10*3/uL (ref 0.00–0.07)
Basophils Absolute: 0.1 10*3/uL (ref 0.0–0.1)
Basophils Relative: 1 %
Eosinophils Absolute: 0.1 10*3/uL (ref 0.0–0.5)
Eosinophils Relative: 2 %
HCT: 38.8 % (ref 36.0–46.0)
Hemoglobin: 13.6 g/dL (ref 12.0–15.0)
Immature Granulocytes: 0 %
Lymphocytes Relative: 53 %
Lymphs Abs: 2.7 10*3/uL (ref 0.7–4.0)
MCH: 34.2 pg — ABNORMAL HIGH (ref 26.0–34.0)
MCHC: 35.1 g/dL (ref 30.0–36.0)
MCV: 97.5 fL (ref 80.0–100.0)
Monocytes Absolute: 0.4 10*3/uL (ref 0.1–1.0)
Monocytes Relative: 9 %
Neutro Abs: 1.8 10*3/uL (ref 1.7–7.7)
Neutrophils Relative %: 35 %
Platelet Count: 164 10*3/uL (ref 150–400)
RBC: 3.98 MIL/uL (ref 3.87–5.11)
RDW: 12.3 % (ref 11.5–15.5)
WBC Count: 5 10*3/uL (ref 4.0–10.5)
nRBC: 0 % (ref 0.0–0.2)

## 2022-11-20 LAB — CMP (CANCER CENTER ONLY)
ALT: 17 U/L (ref 0–44)
AST: 19 U/L (ref 15–41)
Albumin: 4.4 g/dL (ref 3.5–5.0)
Alkaline Phosphatase: 64 U/L (ref 38–126)
Anion gap: 7 (ref 5–15)
BUN: 17 mg/dL (ref 8–23)
CO2: 27 mmol/L (ref 22–32)
Calcium: 9.2 mg/dL (ref 8.9–10.3)
Chloride: 104 mmol/L (ref 98–111)
Creatinine: 0.86 mg/dL (ref 0.44–1.00)
GFR, Estimated: >60 mL/min (ref >60)
Glucose, Bld: 87 mg/dL (ref 70–99)
Potassium: 4.9 mmol/L (ref 3.5–5.1)
Sodium: 138 mmol/L (ref 135–145)
Total Bilirubin: 0.4 mg/dL (ref 0.3–1.2)
Total Protein: 7.5 g/dL (ref 6.5–8.1)

## 2022-11-20 NOTE — Progress Notes (Signed)
[facility]   [phone] [phone]   Clinic Follow up Note   Patient Care Team: Loyola Mast, MD as PCP - General (Family Medicine) Romie Levee, MD as Referring Physician (Neurology) Lovenia Shuck, MD as Referring Physician (Ophthalmology) Harriette Bouillon, MD as Consulting Physician (General Surgery) Malachy Mood, MD as Consulting Physician (Hematology) Antony Blackbird, MD as Consulting Physician (Radiation Oncology) Pershing Proud, RN as Oncology Nurse Navigator Donnelly Angelica, RN as Oncology Nurse Navigator Pollyann Samples, NP as Nurse Practitioner (Nurse Practitioner) Fanny Dance, MD as Consulting Physician (Physical Medicine and Rehabilitation) Jene Every, MD as Consulting Physician (Orthopedic Surgery)  Date of Service:  11/20/2022  CHIEF COMPLAINT: f/u of right breast cancer   CURRENT THERAPY:  Surveillance   ASSESSMENT:  [name] is a 89 y.o. female with   Malignant neoplasm of upper-outer quadrant of right breast in female, estrogen receptor positive (HCC) Stage IA, p(T1c, N0), ER+/PR+/HER2-, Grade 2 -presented with left breast pain. S/p right lumpectomy on 04/20/21, pathology showed IDC, grade 2, 1.7 cm; DCIS; negative margins and lymph nodes. -Pt declined adjuvant tamoxifen, not a good candidate for AI.  -she is clinically doing well from a breast cancer standpoint. Labs reviewed, WNL. Physical exam was unremarkable. There is no clinical concern for recurrence. -Continue breast cancer surveillance    PLAN: -lab reviewed -CMP-normal -Next Surveillance visit in 1 year or sooner if needed -next Diagnostic Mammogram in Jan 2025  SUMMARY OF ONCOLOGIC HISTORY: Oncology History Overview Note  Cancer Staging Malignant neoplasm of upper-outer quadrant of right breast in female, estrogen receptor positive (HCC) Staging form: Breast, AJCC 8th Edition - Clinical stage from 03/23/2021: Stage IA (cT1c, cN0,  cM0, G2, ER+, PR+, HER2-) - Signed by Malachy Mood, MD on 03/30/2021 - Pathologic stage from 04/20/2021: Stage IA (pT1c, pN0, cM0, G2, ER+, PR+, HER2-) - Signed by Malachy Mood, MD on 04/28/2021     Malignant neoplasm of upper-outer quadrant of right breast in female, estrogen receptor positive (HCC)  02/28/2021 Mammogram   Bilateral Diagnostic and Bilateral Breast Ultrasound  IMPRESSION: 1. 1.5 cm mass with imaging features highly suspicious for malignancy in the 12 o'clock position of the right breast. 2. 2 right axillary lymph nodes with borderline eccentric cortical thickening, suspicious for possible metastatic nodes. 3. No evidence of malignancy on the left.   03/23/2021 Cancer Staging   Staging form: Breast, AJCC 8th Edition - Clinical stage from 03/23/2021: Stage IA (cT1c, cN0, cM0, G2, ER+, PR+, HER2-) - Signed by Malachy Mood, MD on 03/30/2021 Stage prefix: Initial diagnosis Histologic grading system: 3 grade system   03/23/2021 Pathology Results   Diagnosis 1. Breast, right, needle core biopsy, 12:00 3cm fn, ribbon clip - INVASIVE DUCTAL CARCINOMA. SEE NOTE - DUCTAL CARCINOMA IN SITU, LOW-GRADE 2. Lymph node, needle/core biopsy, right axilla, q clip - BENIGN LYMPH NODE Diagnosis Note 1. Carcinoma measures 1.2 cm in greatest linear dimension and appears grade 2.  Prognostic panel pending.   03/28/2021 Initial Diagnosis   Malignant neoplasm of upper-outer quadrant of right breast in female, estrogen receptor positive (HCC)   04/20/2021 Cancer Staging   Staging form: Breast, AJCC 8th Edition - Pathologic stage from 04/20/2021: Stage IA (pT1c, pN0, cM0, G2, ER+, PR+, HER2-) - Signed by Malachy Mood, MD on 04/28/2021 Stage prefix: Initial diagnosis Multigene prognostic tests performed: None Histologic grading system: 3 grade system Residual tumor (R): R0 - None   04/20/2021 Pathology Results   FINAL MICROSCOPIC DIAGNOSIS:  A. BREAST, RIGHT, LUMPECTOMY:  -  Invasive ductal carcinoma,  Nottingham grade 2 and of 3, 1.7 cm  -  Ductal carcinoma in-situ, low to intermediate grade  -  Margins uninvolved by carcinoma (see part B below)  -  Previous biopsy site changes present  -  See oncology table and comment below   B. BREAST, RIGHT ADDITIONAL ANTERIOR-LATERAL MARGIN, EXCISION:  -  No residual carcinoma identified   C. LYMPH NODE, RIGHT AXILLARY, SENTINEL, EXCISION:  -  No carcinoma identified in one lymph node (0/1)   D. LYMPH NODE, RIGHT AXILLARY, SENTINEL, EXCISION:  -  No carcinoma identified in one lymph node (0/1)       INTERVAL HISTORY:  [name] is here for a follow up of  right breast cancer .She was last seen by NP Lacie on 05/22/2022. She presents to the clinic alone. Pt stated she has no concerns. Pt reported of having "internal shingles" in the interm.         All other systems were reviewed with the patient and are negative.  MEDICAL HISTORY:  Past Medical History:  Diagnosis Date   Abnormal results of liver function studies 05/15/2007   Allergy    Anal fissure 09/26/2007   Formatting of this note might be different from the original. Qualifier: Diagnosis of  By: Koleen Distance CMA (AAMA), Leisha   Anxiety    B12 deficiency 05/05/2020   Chicken pox    Essential tremor    of head, sometimes hands.  pt takes primidone to help the tremors-DR. MILLER-NEUROLOGIST IN [place].  PT WAS STARTED ON VALUIM MANY YEARS AGO FOR HER TREMORS--AND CONTINUES TO TAKE.   Fracture of humerus, proximal, left, closed 10/28/2011   pt has finished physical therapy-limited ROM reaching to her back and unable to lift heavy things with left hand/arm   GERD (gastroesophageal reflux disease)    H/O hiatal hernia    Hereditary and idiopathic peripheral neuropathy 03/27/2014   Hx of colonic polyps    Osteoarthritis    PAIN AND OA BILATERAL KNEES; ALSO HAS ARTHRITIS IN HANDS AND BACK BUT NO BACK PAIN   Other chronic otitis externa 03/01/2009   Formatting of this note  might be different from the original. Qualifier: Diagnosis of  By: Lovell Sheehan MD, Balinda Quails    SURGICAL HISTORY: Past Surgical History:  Procedure Laterality Date   arthroscopy left knee     arthroscopy right knee     bmp  07/17/2000   BREAST BIOPSY Right 03/23/2021   x2   BREAST LUMPECTOMY Right 04/20/2021   BREAST LUMPECTOMY WITH RADIOACTIVE SEED AND SENTINEL LYMPH NODE BIOPSY Right 04/20/2021   Procedure: RIGHT BREAST LUMPECTOMY WITH RADIOACTIVE SEED AND SENTINEL LYMPH NODE BIOPSY;  Surgeon: Harriette Bouillon, MD;  Location: [facility];  Service: General;  Laterality: Right;   CATARACT EXTRACTION, BILATERAL     COLONOSCOPY W/ POLYPECTOMY     DILATION AND CURETTAGE OF UTERUS     RIGHT FOOT SURGERIES X 2     TOTAL KNEE ARTHROPLASTY  03/13/2012   Procedure: TOTAL KNEE ARTHROPLASTY;  Surgeon: Loanne Drilling, MD;  Location: [facility];  Service: Orthopedics;  Laterality: Left;  steroid injection right knee   TOTAL KNEE ARTHROPLASTY Right 08/04/2013   Procedure: RIGHT TOTAL KNEE ARTHROPLASTY;  Surgeon: Loanne Drilling, MD;  Location: [facility];  Service: Orthopedics;  Laterality: Right;    I have reviewed the social history and family history with the patient and they are  unchanged from previous note.  ALLERGIES:  is allergic to memantine, propranolol, codeine, duloxetine, gabapentin (once-daily), lyrica [pregabalin], and sulfamethoxazole.  MEDICATIONS:  Current Outpatient Medications  Medication Sig Dispense Refill   cholecalciferol (VITAMIN D3) 25 MCG (1000 UNIT) tablet Take 1,000 Units by mouth daily.     diazepam (VALIUM) 5 MG tablet TAKE 1 TABLET(5 MG) BY MOUTH EVERY 8 HOURS AS NEEDED FOR ANXIETY 90 tablet 2   diclofenac Sodium (VOLTAREN ARTHRITIS PAIN) 1 % GEL Apply 4 g topically 4 (four) times daily. SI joint 150 g 3   hyoscyamine (LEVSIN) 0.125 MG tablet Take 1 tablet (0.125 mg total) by mouth every 4 (four) hours as needed for cramping. 60 tablet 1   lidocaine 4 % Place 1  patch onto the skin daily. 30 patch 2   methocarbamol (ROBAXIN) 500 MG tablet Take 1 tablet (500 mg total) by mouth every 6 (six) hours as needed for muscle spasms. 60 tablet 1   montelukast (SINGULAIR) 10 MG tablet Take 1 tablet (10 mg total) by mouth at bedtime. 30 tablet 11   Multiple Vitamin (MULTIVITAMIN) tablet Take 1 tablet by mouth daily.     omeprazole (PRILOSEC) 20 MG capsule Take 1 capsule (20 mg total) by mouth daily. 90 capsule 3   primidone (MYSOLINE) 250 MG tablet Take 1 tablet (250 mg total) by mouth 3 (three) times daily. 270 each 3   No current facility-administered medications for this visit.    PHYSICAL EXAMINATION: ECOG PERFORMANCE STATUS: 0 - Asymptomatic  Vitals:   11/20/22 1253  BP: (!) 149/57  Pulse: 76  Resp: 18  Temp: 98.2 F (36.8 C)  SpO2: 97%   Wt Readings from Last 3 Encounters:  11/20/22 179 lb 1.6 oz (81.2 kg)  08/15/22 175 lb 3.2 oz (79.5 kg)  07/28/22 177 lb (80.3 kg)   GENERAL:alert, no distress and comfortable SKIN: skin color normal, no rashes or significant lesions EYES: normal, Conjunctiva are pink and non-injected, sclera clear  NEURO: alert & oriented x 3 with fluent speech NECK:(-) supple, thyroid normal size, non-tender, without nodularity LYMPH:(-)  no palpable lymphadenopathy in the cervical, axillary  LUNGS: (-)clear to auscultation and percussion with normal breathing effort HEART: (-)regular rate & rhythm and no murmurs and(-)  no lower extremity edema ABDOMEN:(-) abdomen soft, non-tender and normal bowel sounds BREAST: RT breast no no scar tissues , no palpable mass, LT breast no palpable mass breast exam benign. LABORATORY DATA:  I have reviewed the data as listed    Latest Ref Rng & Units 11/20/2022   11:51 AM 05/22/2022   11:46 AM 02/08/2022    4:29 PM  CBC  WBC 4.0 - 10.5 K/uL 5.0  3.7  5.4   Hemoglobin 12.0 - 15.0 g/dL 40.9  81.1  91.4   Hematocrit 36.0 - 46.0 % 38.8  37.7  39.5   Platelets 150 - 400 K/uL 164  171   173.0         Latest Ref Rng & Units 11/20/2022   11:51 AM 05/22/2022   11:46 AM 02/08/2022    4:29 PM  CMP  Glucose 70 - 99 mg/dL 87  85  782   BUN 8 - 23 mg/dL 17  14  14    Creatinine 0.44 - 1.00 mg/dL 9.56  2.13  0.86   Sodium 135 - 145 mmol/L 138  139  138   Potassium 3.5 - 5.1 mmol/L 4.9  4.6  3.9   Chloride 98 - 111 mmol/L 104  105  104   CO2 22 - 32 mmol/L 27  28  27    Calcium 8.9 - 10.3 mg/dL 9.2  9.2  9.1   Total Protein 6.5 - 8.1 g/dL 7.5  7.3  7.2   Total Bilirubin 0.3 - 1.2 mg/dL 0.4  0.5  0.3   Alkaline Phos 38 - 126 U/L 64  69  64   AST 15 - 41 U/L 19  19  20    ALT 0 - 44 U/L 17  14  16        RADIOGRAPHIC STUDIES: I have personally reviewed the radiological images as listed and agreed with the findings in the report. No results found.    Orders Placed This Encounter  Procedures   MM 3D DIAGNOSTIC MAMMOGRAM BILATERAL BREAST    Standing Status:   Future    Standing Expiration Date:   11/20/2023    Order Specific Question:   Reason for Exam (SYMPTOM  OR DIAGNOSIS REQUIRED)    Answer:   screening    Order Specific Question:   Preferred imaging location?    Answer:   [facility]   All questions were answered. The patient knows to call the clinic with any problems, questions or concerns. No barriers to learning was detected. The total time spent in the appointment was 20 minutes.     Malachy Mood, MD 11/20/2022   Carolin Coy, CMA, am acting as scribe for Malachy Mood, MD.   I have reviewed the above documentation for accuracy and completeness, and I agree with the above.

## 2023-01-01 ENCOUNTER — Ambulatory Visit: Payer: Medicare Other | Attending: Surgery

## 2023-01-01 VITALS — Wt 180.0 lb

## 2023-01-01 DIAGNOSIS — Z483 Aftercare following surgery for neoplasm: Secondary | ICD-10-CM | POA: Insufficient documentation

## 2023-01-01 NOTE — Therapy (Signed)
OUTPATIENT PHYSICAL THERAPY SOZO SCREENING NOTE   Patient Name: [name] MRN: [id number] DOB:Dec 07, 1933, 89 y.o., female Today's Date: 01/01/2023  PCP: Loyola Mast, MD REFERRING PROVIDER: Harriette Bouillon, MD   PT End of Session - 01/01/23 1512     Visit Number 1   # unchanged due to screen only   PT Start Time 1511    PT Stop Time 1515    PT Time Calculation (min) 4 min    Activity Tolerance Patient tolerated treatment well    Behavior During Therapy [facility] for tasks assessed/performed              Past Medical History:  Diagnosis Date   Abnormal results of liver function studies 05/15/2007   Allergy    Anal fissure 09/26/2007   Formatting of this note might be different from the original. Qualifier: Diagnosis of  By: Koleen Distance CMA (AAMA), Leisha   Anxiety    B12 deficiency 05/05/2020   Chicken pox    Essential tremor    of head, sometimes hands.  pt takes primidone to help the tremors-DR. MILLER-NEUROLOGIST IN [place].  PT WAS STARTED ON VALUIM MANY YEARS AGO FOR HER TREMORS--AND CONTINUES TO TAKE.   Fracture of humerus, proximal, left, closed 10/28/2011   pt has finished physical therapy-limited ROM reaching to her back and unable to lift heavy things with left hand/arm   GERD (gastroesophageal reflux disease)    H/O hiatal hernia    Hereditary and idiopathic peripheral neuropathy 03/27/2014   Hx of colonic polyps    Osteoarthritis    PAIN AND OA BILATERAL KNEES; ALSO HAS ARTHRITIS IN HANDS AND BACK BUT NO BACK PAIN   Other chronic otitis externa 03/01/2009   Formatting of this note might be different from the original. Qualifier: Diagnosis of  By: Lovell Sheehan MD, Balinda Quails   Past Surgical History:  Procedure Laterality Date   arthroscopy left knee     arthroscopy right knee     bmp  07/17/2000   BREAST BIOPSY Right 03/23/2021   x2   BREAST LUMPECTOMY Right 04/20/2021   BREAST LUMPECTOMY WITH RADIOACTIVE SEED AND SENTINEL LYMPH NODE BIOPSY Right 04/20/2021    Procedure: RIGHT BREAST LUMPECTOMY WITH RADIOACTIVE SEED AND SENTINEL LYMPH NODE BIOPSY;  Surgeon: Harriette Bouillon, MD;  Location: [facility];  Service: General;  Laterality: Right;   CATARACT EXTRACTION, BILATERAL     COLONOSCOPY W/ POLYPECTOMY     DILATION AND CURETTAGE OF UTERUS     RIGHT FOOT SURGERIES X 2     TOTAL KNEE ARTHROPLASTY  03/13/2012   Procedure: TOTAL KNEE ARTHROPLASTY;  Surgeon: Loanne Drilling, MD;  Location: [facility];  Service: Orthopedics;  Laterality: Left;  steroid injection right knee   TOTAL KNEE ARTHROPLASTY Right 08/04/2013   Procedure: RIGHT TOTAL KNEE ARTHROPLASTY;  Surgeon: Loanne Drilling, MD;  Location: [facility];  Service: Orthopedics;  Laterality: Right;   Patient Active Problem List   Diagnosis Date Noted   Chronic right-sided low back pain with right-sided sciatica 08/15/2022   Postherpetic neuralgia 04/05/2022   History of breast cancer, right 08/12/2021   Adrenal nodule- likely benign adenoma (HCC) 04/12/2021   Aortic atherosclerosis (HCC) 04/12/2021   Malignant neoplasm of upper-outer quadrant of right breast in female, estrogen receptor positive (HCC) 03/28/2021   Hyperlipidemia 03/18/2021   Invasive ductal carcinoma of breast, female, right (HCC) 02/28/2021   Vitamin D deficiency 08/05/2019   Elevated blood pressure reading without diagnosis of hypertension 04/30/2019  Allergic rhinitis 10/28/2018   OSA (obstructive sleep apnea) 11/28/2017   Iron overload 02/21/2016   Senile nuclear sclerosis 12/10/2014   Gait difficulty 03/27/2014   Osteopenia 10/31/2012   Gastro-esophageal reflux disease without esophagitis 06/25/2012   Fecal incontinence 06/25/2012   Thoracic radiculitis 07/20/2010   Diverticulosis of colon 09/26/2007   Depression with anxiety 09/26/2007   Enlarged lymph nodes 07/26/2007   History of colonic polyps 05/15/2007   Irritable bowel syndrome 05/15/2007   Tremor, essential 02/11/2007   Osteoarthritis of both  hands 02/11/2007    REFERRING DIAG: right breast cancer at risk for lymphedema  THERAPY DIAG: Aftercare following surgery for neoplasm  PERTINENT HISTORY: Patient was diagnosed on 02/28/2021 with right grade II invasive ductal carcinoma breast cancer. She underwent a right lumpectomy and sentinel node biopsy (5 negative nodes) on 04/20/2021. It is ER/PR positive and HER2 negative with a Ki67 of 1%. She had bilateral knee replacements 10 years ago.   PRECAUTIONS: right UE Lymphedema risk, None  SUBJECTIVE: Pt returns for her 3 month L-Dex screen.   PAIN:  Are you having pain? No  SOZO SCREENING: Patient was assessed today using the SOZO machine to determine the lymphedema index score. This was compared to her baseline score. It was determined that she is within the recommended range when compared to her baseline and no further action is needed at this time. She will continue SOZO screenings. These are done every 3 months for 2 years post operatively followed by every 6 months for 2 years, and then annually.  P: Pt would like to begin 6 month L-Dex screens next.    L-DEX FLOWSHEETS - 01/01/23 1500       L-DEX LYMPHEDEMA SCREENING   Measurement Type Unilateral    L-DEX MEASUREMENT EXTREMITY Upper Extremity    POSITION  Standing    DOMINANT SIDE Right    At Risk Side Right    BASELINE SCORE (UNILATERAL) 3.9    L-DEX SCORE (UNILATERAL) 3.9    VALUE CHANGE (UNILAT) 0                 Hermenia Bers, PTA 01/01/2023, 3:17 PM

## 2023-01-08 ENCOUNTER — Ambulatory Visit: Payer: Medicare Other

## 2023-02-05 ENCOUNTER — Other Ambulatory Visit: Payer: Self-pay | Admitting: Family Medicine

## 2023-02-05 DIAGNOSIS — G250 Essential tremor: Secondary | ICD-10-CM

## 2023-02-13 ENCOUNTER — Ambulatory Visit (INDEPENDENT_AMBULATORY_CARE_PROVIDER_SITE_OTHER): Payer: Medicare Other | Admitting: Family Medicine

## 2023-02-13 ENCOUNTER — Encounter: Payer: Self-pay | Admitting: Family Medicine

## 2023-02-13 VITALS — BP 132/70 | HR 74 | Temp 97.1°F | Ht 64.0 in | Wt 178.8 lb

## 2023-02-13 DIAGNOSIS — E559 Vitamin D deficiency, unspecified: Secondary | ICD-10-CM | POA: Diagnosis not present

## 2023-02-13 DIAGNOSIS — E782 Mixed hyperlipidemia: Secondary | ICD-10-CM | POA: Diagnosis not present

## 2023-02-13 DIAGNOSIS — G250 Essential tremor: Secondary | ICD-10-CM | POA: Diagnosis not present

## 2023-02-13 DIAGNOSIS — N3281 Overactive bladder: Secondary | ICD-10-CM

## 2023-02-13 LAB — URINALYSIS, ROUTINE W REFLEX MICROSCOPIC
Bilirubin Urine: NEGATIVE
Hgb urine dipstick: NEGATIVE
Ketones, ur: NEGATIVE
Leukocytes,Ua: NEGATIVE
Nitrite: NEGATIVE
RBC / HPF: NONE SEEN (ref 0–?)
Specific Gravity, Urine: <=1.005 — AB (ref 1.000–1.030)
Total Protein, Urine: NEGATIVE
Urine Glucose: NEGATIVE
Urobilinogen, UA: 0.2 (ref 0.0–1.0)
WBC, UA: NONE SEEN (ref 0–?)
pH: 7 (ref 5.0–8.0)

## 2023-02-13 LAB — LDL CHOLESTEROL, DIRECT: Direct LDL: 136 mg/dL

## 2023-02-13 LAB — LIPID PANEL
Cholesterol: 281 mg/dL — ABNORMAL HIGH (ref 0–200)
HDL: 51.5 mg/dL (ref >39.00)
NonHDL: 229.24
Total CHOL/HDL Ratio: 5
Triglycerides: 359 mg/dL — ABNORMAL HIGH (ref 0.0–149.0)
VLDL: 71.8 mg/dL — ABNORMAL HIGH (ref 0.0–40.0)

## 2023-02-13 LAB — VITAMIN D 25 HYDROXY (VIT D DEFICIENCY, FRACTURES): VITD: 20.3 ng/mL — ABNORMAL LOW (ref 30.00–100.00)

## 2023-02-13 MED ORDER — ROSUVASTATIN CALCIUM 5 MG PO TABS
5.0000 mg | ORAL_TABLET | Freq: Every day | ORAL | 3 refills | Status: DC
Start: 2023-02-13 — End: 2023-08-14

## 2023-02-13 MED ORDER — VITAMIN D (ERGOCALCIFEROL) 1.25 MG (50000 UNIT) PO CAPS
50000.0000 [IU] | ORAL_CAPSULE | ORAL | 0 refills | Status: DC
Start: 2023-02-13 — End: 2023-05-12

## 2023-02-13 MED ORDER — OXYBUTYNIN CHLORIDE ER 5 MG PO TB24
5.0000 mg | ORAL_TABLET | Freq: Every day | ORAL | 11 refills | Status: DC
Start: 2023-02-13 — End: 2023-08-14

## 2023-02-13 NOTE — Assessment & Plan Note (Signed)
Stable. Continue diazepam 5 mg TID and primidone 250 mg TID. We did discuss that some of her symptoms are consistent with side effects of the diazepam. For now, they are tolerable/manageable, so will monitor this.

## 2023-02-13 NOTE — Addendum Note (Signed)
Addended by: Loyola Mast on: 02/13/2023 04:42 PM   Modules accepted: Orders

## 2023-02-13 NOTE — Assessment & Plan Note (Signed)
I will assess her current iron stores to see if this issue remains.

## 2023-02-13 NOTE — Assessment & Plan Note (Signed)
I will reassess ehr Vitamin D level to see if this remains an issue.

## 2023-02-13 NOTE — Progress Notes (Signed)
[facility] [address] [place] [address] Dept: [phone] Dept Fax: [phone]  Chronic Care Office Visit  Subjective:    Patient ID: [name], female    DOB: 1933/09/09, 89 y.o..   MRN: [id number]  Chief Complaint  Patient presents with   Follow-up    6 month f/u. C/o having some dizziness off/on x months,  also feels sleepy all the time   History of Present Illness:  Patient is in today for reassessment of chronic medical issues.  [name] has a history of essential tremor. She is managed on diazepam 5 mg TID and primidone 250 mg TID. Overall, she feels her tremor is manageable. She does complain of a generalized sense of drowsiness/sleepiness during the day. She also has occasional sensations of lightheadedness with postural changes.   [name] has had an issue over the past year with urinary urgency and incontinence. She notes at times, she is unable to make it to the bathroom, including episodes she has at night.  Past Medical History: Patient Active Problem List   Diagnosis Date Noted   Chronic right-sided low back pain with right-sided sciatica 08/15/2022   Postherpetic neuralgia 04/05/2022   History of breast cancer, right 08/12/2021   Adrenal nodule- likely benign adenoma (HCC) 04/12/2021   Aortic atherosclerosis (HCC) 04/12/2021   Malignant neoplasm of upper-outer quadrant of right breast in female, estrogen receptor positive (HCC) 03/28/2021   Hyperlipidemia 03/18/2021   Invasive ductal carcinoma of breast, female, right (HCC) 02/28/2021   Vitamin D deficiency 08/05/2019   Elevated blood pressure reading without diagnosis of hypertension 04/30/2019   Allergic rhinitis 10/28/2018   OSA (obstructive sleep apnea) 11/28/2017   Iron overload 02/21/2016   Senile nuclear sclerosis 12/10/2014   Gait difficulty 03/27/2014   Osteopenia 10/31/2012   Gastro-esophageal reflux disease without  esophagitis 06/25/2012   Fecal incontinence 06/25/2012   Thoracic radiculitis 07/20/2010   Diverticulosis of colon 09/26/2007   Depression with anxiety 09/26/2007   Enlarged lymph nodes 07/26/2007   History of colonic polyps 05/15/2007   Irritable bowel syndrome 05/15/2007   Tremor, essential 02/11/2007   Osteoarthritis of both hands 02/11/2007   Past Surgical History:  Procedure Laterality Date   arthroscopy left knee     arthroscopy right knee     bmp  07/17/2000   BREAST BIOPSY Right 03/23/2021   x2   BREAST LUMPECTOMY Right 04/20/2021   BREAST LUMPECTOMY WITH RADIOACTIVE SEED AND SENTINEL LYMPH NODE BIOPSY Right 04/20/2021   Procedure: RIGHT BREAST LUMPECTOMY WITH RADIOACTIVE SEED AND SENTINEL LYMPH NODE BIOPSY;  Surgeon: Harriette Bouillon, MD;  Location: [facility];  Service: General;  Laterality: Right;   CATARACT EXTRACTION, BILATERAL     COLONOSCOPY W/ POLYPECTOMY     DILATION AND CURETTAGE OF UTERUS     RIGHT FOOT SURGERIES X 2     TOTAL KNEE ARTHROPLASTY  03/13/2012   Procedure: TOTAL KNEE ARTHROPLASTY;  Surgeon: Loanne Drilling, MD;  Location: [facility];  Service: Orthopedics;  Laterality: Left;  steroid injection right knee   TOTAL KNEE ARTHROPLASTY Right 08/04/2013   Procedure: RIGHT TOTAL KNEE ARTHROPLASTY;  Surgeon: Loanne Drilling, MD;  Location: [facility];  Service: Orthopedics;  Laterality: Right;   Family History  Problem Relation Age of Onset   Stroke Mother    Kidney disease Father    Cancer Maternal Grandmother        Leukemia   Kidney failure Other  renal disease   Outpatient Medications Prior to Visit  Medication Sig Dispense Refill   cetirizine (ZYRTEC) 10 MG tablet Take by mouth.     cholecalciferol (VITAMIN D3) 25 MCG (1000 UNIT) tablet Take 1,000 Units by mouth daily.     diazepam (VALIUM) 5 MG tablet TAKE 1 TABLET(5 MG) BY MOUTH EVERY 8 HOURS AS NEEDED FOR ANXIETY 90 tablet 3   hyoscyamine (LEVSIN) 0.125 MG tablet Take 1 tablet  (0.125 mg total) by mouth every 4 (four) hours as needed for cramping. 60 tablet 1   Multiple Vitamin (MULTIVITAMIN) tablet Take 1 tablet by mouth daily.     omeprazole (PRILOSEC) 20 MG capsule Take 1 capsule (20 mg total) by mouth daily. 90 capsule 3   primidone (MYSOLINE) 250 MG tablet Take 1 tablet (250 mg total) by mouth 3 (three) times daily. 270 each 3   diclofenac Sodium (VOLTAREN ARTHRITIS PAIN) 1 % GEL Apply 4 g topically 4 (four) times daily. SI joint (Patient not taking: Reported on 02/13/2023) 150 g 3   methocarbamol (ROBAXIN) 500 MG tablet Take 1 tablet (500 mg total) by mouth every 6 (six) hours as needed for muscle spasms. (Patient not taking: Reported on 02/13/2023) 60 tablet 1   montelukast (SINGULAIR) 10 MG tablet Take 1 tablet (10 mg total) by mouth at bedtime. (Patient not taking: Reported on 02/13/2023) 30 tablet 11   lidocaine 4 % Place 1 patch onto the skin daily. 30 patch 2   No facility-administered medications prior to visit.   Allergies  Allergen Reactions   Memantine Swelling   Propranolol Other (See Comments)    Made her head feel very funny Made her head feel very funny    Codeine Nausea And Vomiting   Duloxetine     Tremor    Gabapentin (Once-Daily)     Made her feel funny.   Lyrica [Pregabalin]     Made her feel funny   Sulfamethoxazole Rash    REACTION: unspecified   Objective:   Today's Vitals   02/13/23 1250  BP: 132/70  Pulse: 74  Temp: (!) 97.1 F (36.2 C)  TempSrc: Temporal  SpO2: 96%  Weight: 178 lb 12.8 oz (81.1 kg)  Height: 5\' 4"  (1.626 m)   Body mass index is 30.69 kg/m.   General: Well developed, well nourished. No acute distress. HEENT: Normocephalic, non-traumatic. External ears normal. EAC and TMs normal bilaterally. PERRL, EOMI.   Conjunctiva clear. Nose clear without congestion or rhinorrhea. Mucous membranes moist. Oropharynx clear.   Good dentition. Neck: Supple. No lymphadenopathy. No thyromegaly. Lungs: Clear to  auscultation bilaterally. No wheezing, rales or rhonchi. CV: RRR without murmurs or rubs. Pulses 2+ bilaterally. Neuro: No focal neurological deficits. Psych: Alert and oriented. Normal mood and affect.  Health Maintenance Due  Topic Date Due   Zoster Vaccines- Shingrix (1 of 2) Never done   DTaP/Tdap/Td (2 - Tdap) 07/17/2002   Medicare Annual Wellness (AWV)  07/19/2022     Assessment & Plan:   Problem List Items Addressed This Visit       Nervous and Auditory   Tremor, essential - Primary    Stable. Continue diazepam 5 mg TID and primidone 250 mg TID. We did discuss that some of her symptoms are consistent with side effects of the diazepam. For now, they are tolerable/manageable, so will monitor this.        Genitourinary   Overactive bladder    Symptoms are consistent with urge incontinence/OAB. I will try her  on a trial of oxybutynin and see if we can improve her symptoms.      Relevant Medications   oxybutynin (DITROPAN-XL) 5 MG 24 hr tablet   Other Relevant Orders   Urinalysis, Routine w reflex microscopic     Other   Hyperlipidemia    I will reassess lipids. Not currently on medication for this condition.      Relevant Orders   Lipid panel   Iron overload    I will assess her current iron stores to see if this issue remains.      Relevant Orders   Iron, TIBC and Ferritin Panel   Vitamin D deficiency    I will reassess ehr Vitamin D level to see if this remains an issue.      Relevant Orders   VITAMIN D 25 Hydroxy (Vit-D Deficiency, Fractures)    Return in about 4 weeks (around 03/13/2023) for Reassessment.   Loyola Mast, MD

## 2023-02-13 NOTE — Assessment & Plan Note (Signed)
I will reassess lipids. Not currently on medication for this condition.

## 2023-02-13 NOTE — Assessment & Plan Note (Signed)
Symptoms are consistent with urge incontinence/OAB. I will try her on a trial of oxybutynin and see if we can improve her symptoms.

## 2023-03-02 DIAGNOSIS — Z96651 Presence of right artificial knee joint: Secondary | ICD-10-CM | POA: Diagnosis not present

## 2023-03-26 DIAGNOSIS — M7918 Myalgia, other site: Secondary | ICD-10-CM | POA: Diagnosis not present

## 2023-03-26 DIAGNOSIS — M5451 Vertebrogenic low back pain: Secondary | ICD-10-CM | POA: Diagnosis not present

## 2023-05-09 ENCOUNTER — Other Ambulatory Visit: Payer: Self-pay | Admitting: Family Medicine

## 2023-05-09 ENCOUNTER — Telehealth: Payer: Self-pay | Admitting: Family Medicine

## 2023-05-09 ENCOUNTER — Ambulatory Visit: Payer: Medicare Other | Admitting: Family Medicine

## 2023-05-09 ENCOUNTER — Encounter: Payer: Self-pay | Admitting: Family Medicine

## 2023-05-09 VITALS — BP 134/70 | HR 84 | Temp 97.8°F | Ht 64.0 in | Wt 180.2 lb

## 2023-05-09 DIAGNOSIS — Z23 Encounter for immunization: Secondary | ICD-10-CM

## 2023-05-09 DIAGNOSIS — N644 Mastodynia: Secondary | ICD-10-CM

## 2023-05-09 LAB — CBC WITH DIFFERENTIAL/PLATELET
Basophils Absolute: 0.1 10*3/uL (ref 0.0–0.1)
Basophils Relative: 1 % (ref 0.0–3.0)
Eosinophils Absolute: 0.1 10*3/uL (ref 0.0–0.7)
Eosinophils Relative: 2.1 % (ref 0.0–5.0)
HCT: 41.7 % (ref 36.0–46.0)
Hemoglobin: 13.8 g/dL (ref 12.0–15.0)
Lymphocytes Relative: 51 % — ABNORMAL HIGH (ref 12.0–46.0)
Lymphs Abs: 2.7 10*3/uL (ref 0.7–4.0)
MCHC: 33 g/dL (ref 30.0–36.0)
MCV: 99.7 fL (ref 78.0–100.0)
Monocytes Absolute: 0.5 10*3/uL (ref 0.1–1.0)
Monocytes Relative: 9.7 % (ref 3.0–12.0)
Neutro Abs: 1.9 10*3/uL (ref 1.4–7.7)
Neutrophils Relative %: 36.2 % — ABNORMAL LOW (ref 43.0–77.0)
Platelets: 184 10*3/uL (ref 150.0–400.0)
RBC: 4.18 Mil/uL (ref 3.87–5.11)
RDW: 12.9 % (ref 11.5–15.5)
WBC: 5.2 10*3/uL (ref 4.0–10.5)

## 2023-05-09 NOTE — Telephone Encounter (Signed)
[name] [phone]   Pt called to say that the referral to  [facility]  Can not be scheduled until the 19th and Dr Veto Kemps has asked that she come back in 10 days for a reassessment. She sounds a little anxious, is there another office to send her to?

## 2023-05-09 NOTE — Assessment & Plan Note (Signed)
Etiology is uncertain. I will order a CBC to rule-out infection. I will order an ultrasound oft he left breast and axilla to assess for potential causes. [name] feels her breast would be too tender for her to tolerate a mammogram at this time. I also recommend she continue Aleve 220 mg bid for 7 days.

## 2023-05-09 NOTE — Patient Instructions (Signed)
Apply heating pad or a hot towel to the left breast and axilla for 20 min., twice a day. Take Aleve 220 mg 1 tablet twice a day for 7 days.

## 2023-05-09 NOTE — Progress Notes (Signed)
[facility] [address] [place] [address] Dept: [phone] Dept Fax: [phone]  Office Visit  Subjective:    Patient ID: [name], female    DOB: 04/03/1934, 89 y.o..   MRN: [id number]  Chief Complaint  Patient presents with   Breast Pain    C/o having LT breast swelling/burning x 4 weeks & swollen gland under LT armpit x 2 weeks.  Has taken Aleve.    History of Present Illness:  Patient is in today complaining of at least a 1 month history of left breast swelling and tenderness. This has been painful enough that she has stopped wearing a bra. She notes over the past 2 weeks that she is also experiencing pain and swelling into the left axilla. She has not had fever. She denies any nipple drainage. She has not noted any specific mass or redness to the breast. She has a past history of a right localized breast cancer that was managed with lumpectomy. Her last mammogram and breast ultrasound was in Jan.  Past Medical History: Patient Active Problem List   Diagnosis Date Noted   Overactive bladder 02/13/2023   Chronic right-sided low back pain with right-sided sciatica 08/15/2022   Postherpetic neuralgia 04/05/2022   History of breast cancer, right 08/12/2021   Adrenal nodule- likely benign adenoma (HCC) 04/12/2021   Aortic atherosclerosis (HCC) 04/12/2021   Malignant neoplasm of upper-outer quadrant of right breast in female, estrogen receptor positive (HCC) 03/28/2021   Hyperlipidemia 03/18/2021   Invasive ductal carcinoma of breast, female, right (HCC) 02/28/2021   Vitamin D deficiency 08/05/2019   Elevated blood pressure reading without diagnosis of hypertension 04/30/2019   Allergic rhinitis 10/28/2018   OSA (obstructive sleep apnea) 11/28/2017   Senile nuclear sclerosis 12/10/2014   Gait difficulty 03/27/2014   Osteopenia 10/31/2012   Gastro-esophageal reflux disease without esophagitis 06/25/2012    Fecal incontinence 06/25/2012   Thoracic radiculitis 07/20/2010   Diverticulosis of colon 09/26/2007   Depression with anxiety 09/26/2007   Enlarged lymph nodes 07/26/2007   History of colonic polyps 05/15/2007   Irritable bowel syndrome 05/15/2007   Tremor, essential 02/11/2007   Osteoarthritis of both hands 02/11/2007   Past Surgical History:  Procedure Laterality Date   arthroscopy left knee     arthroscopy right knee     bmp  07/17/2000   BREAST BIOPSY Right 03/23/2021   x2   BREAST LUMPECTOMY Right 04/20/2021   BREAST LUMPECTOMY WITH RADIOACTIVE SEED AND SENTINEL LYMPH NODE BIOPSY Right 04/20/2021   Procedure: RIGHT BREAST LUMPECTOMY WITH RADIOACTIVE SEED AND SENTINEL LYMPH NODE BIOPSY;  Surgeon: Krista Bouillon, MD;  Location: [facility];  Service: General;  Laterality: Right;   CATARACT EXTRACTION, BILATERAL     COLONOSCOPY W/ POLYPECTOMY     DILATION AND CURETTAGE OF UTERUS     RIGHT FOOT SURGERIES X 2     TOTAL KNEE ARTHROPLASTY  03/13/2012   Procedure: TOTAL KNEE ARTHROPLASTY;  Surgeon: Krista Drilling, MD;  Location: [facility];  Service: Orthopedics;  Laterality: Left;  steroid injection right knee   TOTAL KNEE ARTHROPLASTY Right 08/04/2013   Procedure: RIGHT TOTAL KNEE ARTHROPLASTY;  Surgeon: Krista Drilling, MD;  Location: [facility];  Service: Orthopedics;  Laterality: Right;   Family History  Problem Relation Age of Onset   Stroke Mother    Kidney disease Father    Cancer Maternal Grandmother        Leukemia   Kidney failure  Other        renal disease   Outpatient Medications Prior to Visit  Medication Sig Dispense Refill   cetirizine (ZYRTEC) 10 MG tablet Take by mouth.     diazepam (VALIUM) 5 MG tablet TAKE 1 TABLET(5 MG) BY MOUTH EVERY 8 HOURS AS NEEDED FOR ANXIETY 90 tablet 3   hyoscyamine (LEVSIN) 0.125 MG tablet Take 1 tablet (0.125 mg total) by mouth every 4 (four) hours as needed for cramping. 60 tablet 1   Multiple Vitamin (MULTIVITAMIN)  tablet Take 1 tablet by mouth daily.     omeprazole (PRILOSEC) 20 MG capsule Take 1 capsule (20 mg total) by mouth daily. 90 capsule 3   oxybutynin (DITROPAN-XL) 5 MG 24 hr tablet Take 1 tablet (5 mg total) by mouth at bedtime. 30 tablet 11   primidone (MYSOLINE) 250 MG tablet Take 1 tablet (250 mg total) by mouth 3 (three) times daily. 270 each 3   rosuvastatin (CRESTOR) 5 MG tablet Take 1 tablet (5 mg total) by mouth daily. 90 tablet 3   Vitamin D, Ergocalciferol, (DRISDOL) 1.25 MG (50000 UNIT) CAPS capsule Take 1 capsule (50,000 Units total) by mouth every 7 (seven) days. 12 capsule 0   No facility-administered medications prior to visit.   Allergies  Allergen Reactions   Memantine Swelling   Propranolol Other (See Comments)    Made her head feel very funny Made her head feel very funny    Codeine Nausea And Vomiting   Duloxetine     Tremor    Gabapentin (Once-Daily)     Made her feel funny.   Lyrica [Pregabalin]     Made her feel funny   Sulfamethoxazole Rash    REACTION: unspecified     Objective:   Today's Vitals   05/09/23 1310  BP: 134/70  Pulse: 84  Temp: 97.8 F (36.6 C)  TempSrc: Temporal  SpO2: 94%  Weight: 180 lb 3.2 oz (81.7 kg)  Height: 5\' 4"  (1.626 m)   Body mass index is 30.93 kg/m.   General: Well developed, well nourished. No acute distress. Breast: There is a mild enlargement of the right breast. No sign of redness and no skin or nipple changes. Tehre is a   fullness in the tail of the breast extending up into the axilla. Psych: Alert and oriented. Normal mood and affect.  Health Maintenance Due  Topic Date Due   Zoster Vaccines- Shingrix (1 of 2) Never done   DTaP/Tdap/Td (2 - Tdap) 07/17/2002   Medicare Annual Wellness (AWV)  07/19/2022   INFLUENZA VACCINE  02/15/2023     Assessment & Plan:   Problem List Items Addressed This Visit       Other   Mastalgia - Primary    Etiology is uncertain. I will order a CBC to rule-out infection. I  will order an ultrasound oft he left breast and axilla to assess for potential causes. [name] feels her breast would be too tender for her to tolerate a mammogram at this time. I also recommend she continue Aleve 220 mg bid for 7 days.      Relevant Orders   CBC with Differential/Platelet (Completed)   [place] LIMITED ULTRASOUND INCLUDING AXILLA LEFT BREAST    Other Visit Diagnoses     Need for immunization against influenza       Relevant Orders   Flu Vaccine Trivalent High Dose (Fluad) (Completed)       Return in about 10 days (around 05/19/2023) for Reassessment.  Loyola Mast, MD

## 2023-05-10 NOTE — Telephone Encounter (Signed)
Pt moved to [facility]

## 2023-05-10 NOTE — Telephone Encounter (Signed)
Patient notified VIA phone and given their number to call if she doesn't hear anything.  Dm/cma

## 2023-05-12 ENCOUNTER — Other Ambulatory Visit: Payer: Self-pay | Admitting: Family Medicine

## 2023-05-12 DIAGNOSIS — E559 Vitamin D deficiency, unspecified: Secondary | ICD-10-CM

## 2023-05-14 ENCOUNTER — Telehealth: Payer: Self-pay | Admitting: Family Medicine

## 2023-05-14 NOTE — Addendum Note (Signed)
Addended by: Waymond Cera on: 05/14/2023 11:23 AM   Modules accepted: Orders

## 2023-05-14 NOTE — Telephone Encounter (Signed)
Called patient and she stated that she called [facility] and made an apportionment for 05/23/23 for the US Breast.  I sent both the mammogram and [place] orders to them at [phone]. Dm/cma

## 2023-05-14 NOTE — Telephone Encounter (Signed)
Pt would like for you to give her a call

## 2023-05-21 ENCOUNTER — Ambulatory Visit: Payer: Medicare Other | Admitting: Family Medicine

## 2023-05-23 DIAGNOSIS — Z853 Personal history of malignant neoplasm of breast: Secondary | ICD-10-CM | POA: Diagnosis not present

## 2023-05-23 DIAGNOSIS — N6325 Unspecified lump in the left breast, overlapping quadrants: Secondary | ICD-10-CM | POA: Diagnosis not present

## 2023-05-23 DIAGNOSIS — N644 Mastodynia: Secondary | ICD-10-CM | POA: Diagnosis not present

## 2023-05-23 LAB — HM MAMMOGRAPHY

## 2023-05-28 ENCOUNTER — Ambulatory Visit (INDEPENDENT_AMBULATORY_CARE_PROVIDER_SITE_OTHER): Payer: Medicare Other | Admitting: Family Medicine

## 2023-05-28 VITALS — BP 138/76 | HR 74 | Temp 97.2°F | Ht 64.0 in | Wt 180.4 lb

## 2023-05-28 DIAGNOSIS — M792 Neuralgia and neuritis, unspecified: Secondary | ICD-10-CM | POA: Insufficient documentation

## 2023-05-28 MED ORDER — PREGABALIN 25 MG PO CAPS: 25.0000 mg | ORAL_CAPSULE | Freq: Two times a day (BID) | ORAL | 0 refills | Status: DC

## 2023-05-28 NOTE — Assessment & Plan Note (Signed)
Reviewed left breast mammogram and ultrasound, which were normal. [name] appears to be having a neuritis of the T1-T2 nerve of the left chest. This appears to be similar to an issue she had a year ago in the left T6-T7 level. I will try her on pregabalin for pain management. She has an unused Medrol dosepak at home. I recommend she go ahead and take this course. I will see her back in 3 weeks.

## 2023-05-28 NOTE — Progress Notes (Signed)
[facility] [address] [place] [address] Dept: [phone] Dept Fax: [phone]  Office Visit  Subjective:    Patient ID: [name], female    DOB: 25-Jul-1933, 89 y.o..   MRN: [id number]  Chief Complaint  Patient presents with   Follow-up    10 day f/u. Still having severe pain (feels like 1000 bee stings) under the LT armpit/breast.     History of Present Illness:  Patient is in today for reassessment of left-sided breast pain. I saw her on 10/23 with a month's history of left breast pain radiating into the axilla. She has a past history of a right localized breast cancer that was managed with lumpectomy. I ordered labs, a breast ultrasound, and recommended she use Aleve for breast pain.   Ms. Massoth continues to have pins and needles type pain in the left breast, axilla, and now down the medial aspect of the left upper arm. She had a history of an acute pain around her left flank/chest last year. This has been painful along an area overlying the 8th-9th ribs. The pain initially started in the back and gradually shifted around anteriorly. She tried gabapentin, but this made her feel drunk (? reaction along with primidone). Topical capsaicin was not tolerable to her. We eventually settled on a regimen of amitriptyline 25 mg daily and CBD ointment. I referred her to physiatry. Dr. 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 She was tried on Cymbalta, but had worse shakes with this. By January, the pain resolved.  Past Medical History: Patient Active Problem List   Diagnosis Date Noted   Mastalgia 05/09/2023   Overactive bladder 02/13/2023   Chronic right-sided low back pain with right-sided sciatica 08/15/2022   Postherpetic neuralgia 04/05/2022   History of breast cancer, right 08/12/2021   Adrenal nodule- likely benign adenoma (HCC) 04/12/2021   Aortic atherosclerosis (HCC) 04/12/2021   Malignant neoplasm  of upper-outer quadrant of right breast in female, estrogen receptor positive (HCC) 03/28/2021   Hyperlipidemia 03/18/2021   Invasive ductal carcinoma of breast, female, right (HCC) 02/28/2021   Vitamin D deficiency 08/05/2019   Elevated blood pressure reading without diagnosis of hypertension 04/30/2019   Allergic rhinitis 10/28/2018   OSA (obstructive sleep apnea) 11/28/2017   Senile nuclear sclerosis 12/10/2014   Gait difficulty 03/27/2014   Osteopenia 10/31/2012   Gastro-esophageal reflux disease without esophagitis 06/25/2012   Fecal incontinence 06/25/2012   Thoracic radiculitis 07/20/2010   Diverticulosis of colon 09/26/2007   Depression with anxiety 09/26/2007   Enlarged lymph nodes 07/26/2007   History of colonic polyps 05/15/2007   Irritable bowel syndrome 05/15/2007   Tremor, essential 02/11/2007   Osteoarthritis of both hands 02/11/2007   Past Surgical History:  Procedure Laterality Date   arthroscopy left knee     arthroscopy right knee     bmp  07/17/2000   BREAST BIOPSY Right 03/23/2021   x2   BREAST LUMPECTOMY Right 04/20/2021   BREAST LUMPECTOMY WITH RADIOACTIVE SEED AND SENTINEL LYMPH NODE BIOPSY Right 04/20/2021   Procedure: RIGHT BREAST LUMPECTOMY WITH RADIOACTIVE SEED AND SENTINEL LYMPH NODE BIOPSY;  Surgeon: Harriette Bouillon, MD;  Location: [facility];  Service: General;  Laterality: Right;   CATARACT EXTRACTION, BILATERAL     COLONOSCOPY W/ POLYPECTOMY     DILATION AND CURETTAGE OF UTERUS     RIGHT FOOT SURGERIES X 2     TOTAL KNEE ARTHROPLASTY  03/13/2012   Procedure: TOTAL KNEE  ARTHROPLASTY;  Surgeon: Loanne Drilling, MD;  Location: [facility];  Service: Orthopedics;  Laterality: Left;  steroid injection right knee   TOTAL KNEE ARTHROPLASTY Right 08/04/2013   Procedure: RIGHT TOTAL KNEE ARTHROPLASTY;  Surgeon: Loanne Drilling, MD;  Location[facility];  Service: Orthopedics;  Laterality: Right;   Family History  Problem Relation Age of Onset    Stroke Mother    Kidney disease Father    Cancer Maternal Grandmother        Leukemia   Kidney failure Other        renal disease   Outpatient Medications Prior to Visit  Medication Sig Dispense Refill   cetirizine (ZYRTEC) 10 MG tablet Take by mouth.     diazepam (VALIUM) 5 MG tablet TAKE 1 TABLET(5 MG) BY MOUTH EVERY 8 HOURS AS NEEDED FOR ANXIETY 90 tablet 3   hyoscyamine (LEVSIN) 0.125 MG tablet Take 1 tablet (0.125 mg total) by mouth every 4 (four) hours as needed for cramping. 60 tablet 1   Multiple Vitamin (MULTIVITAMIN) tablet Take 1 tablet by mouth daily.     omeprazole (PRILOSEC) 20 MG capsule Take 1 capsule (20 mg total) by mouth daily. 90 capsule 3   oxybutynin (DITROPAN-XL) 5 MG 24 hr tablet Take 1 tablet (5 mg total) by mouth at bedtime. 30 tablet 11   primidone (MYSOLINE) 250 MG tablet Take 1 tablet (250 mg total) by mouth 3 (three) times daily. 270 each 3   rosuvastatin (CRESTOR) 5 MG tablet Take 1 tablet (5 mg total) by mouth daily. 90 tablet 3   Vitamin D, Ergocalciferol, (DRISDOL) 1.25 MG (50000 UNIT) CAPS capsule TAKE 1 CAPSULE BY MOUTH EVERY 7 DAYS 12 capsule 0   No facility-administered medications prior to visit.   Allergies  Allergen Reactions   Memantine Swelling   Propranolol Other (See Comments)    Made her head feel very funny Made her head feel very funny    Codeine Nausea And Vomiting   Duloxetine     Tremor    Gabapentin (Once-Daily)     Made her feel funny.   Lyrica [Pregabalin]     Made her feel funny   Sulfamethoxazole Rash    REACTION: unspecified     Objective:   Today's Vitals   05/28/23 1321  BP: 138/76  Pulse: 74  Temp: (!) 97.2 F (36.2 C)  TempSrc: Temporal  SpO2: 96%  Weight: 180 lb 6.4 oz (81.8 kg)  Height: 5\' 4"  (1.626 m)   Body mass index is 30.97 kg/m.   General: Well developed, well nourished. No acute distress. Psych: Alert and oriented. Normal mood and affect.  Health Maintenance Due  Topic Date Due   Zoster  Vaccines- Shingrix (1 of 2) Never done   DTaP/Tdap/Td (2 - Tdap) 07/17/2002   Medicare Annual Wellness (AWV)  07/19/2022   Lab Results    Latest Ref Rng & Units 05/09/2023    1:53 PM 11/20/2022   11:51 AM 05/22/2022   11:46 AM  CBC  WBC 4.0 - 10.5 K/uL 5.2  5.0  3.7   Hemoglobin 12.0 - 15.0 g/dL 29.5  62.1  30.8   Hematocrit 36.0 - 46.0 % 41.7  38.8  37.7   Platelets 150.0 - 400.0 K/uL 184.0  164  171      Assessment & Plan:   Problem List Items Addressed This Visit       Nervous and Auditory   Left Chest Neuritis (T1-T2) - Primary  Reviewed left breast mammogram and ultrasound, which were normal. Ms. Hession appears to be having a neuritis of the T1-T2 nerve of the left chest. This appears to be similar to an issue she had a year ago in the left T6-T7 level. I will try her on pregabalin for pain management. She has an unused Medrol dosepak at home. I recommend she go ahead and take this course. I will see her back in 3 weeks.      Relevant Medications   pregabalin (LYRICA) 25 MG capsule    Return in about 3 weeks (around 06/18/2023) for Reassessment.   Loyola Mast, MD

## 2023-06-05 ENCOUNTER — Other Ambulatory Visit: Payer: Medicare Other

## 2023-06-15 ENCOUNTER — Other Ambulatory Visit: Payer: Self-pay | Admitting: Family Medicine

## 2023-06-15 DIAGNOSIS — G250 Essential tremor: Secondary | ICD-10-CM

## 2023-06-18 ENCOUNTER — Encounter: Payer: Self-pay | Admitting: Family Medicine

## 2023-06-18 ENCOUNTER — Ambulatory Visit (INDEPENDENT_AMBULATORY_CARE_PROVIDER_SITE_OTHER): Payer: Medicare Other | Admitting: Family Medicine

## 2023-06-18 VITALS — BP 134/72 | HR 62 | Temp 97.5°F | Ht 64.0 in | Wt 179.8 lb

## 2023-06-18 DIAGNOSIS — M792 Neuralgia and neuritis, unspecified: Secondary | ICD-10-CM | POA: Diagnosis not present

## 2023-06-18 MED ORDER — PREGABALIN 25 MG PO CAPS: 25.0000 mg | ORAL_CAPSULE | Freq: Two times a day (BID) | ORAL | 2 refills | Status: DC

## 2023-06-18 NOTE — Progress Notes (Signed)
[facility] [address] [place] [address] Dept: [phone] Dept Fax: [phone]  Office Visit  Subjective:    Patient ID: [name], female    DOB: Oct 18, 1933, 89 y.o..   MRN: [id number]  Chief Complaint  Patient presents with   Follow-up    3 month f/u.  C/o still having the pain in the LT breast/arm pain.      History of Present Illness:  Patient is in today for reassessment of left-sided breast pain. I saw her on 10/23 with a month's history of left breast pain radiating into the axilla. She has a past history of a right localized breast cancer that was managed with lumpectomy. I ordered labs, a breast ultrasound, and recommended she use Aleve for breast pain. The labs and x-ray returned normal.    I saw Ms. Palo back on 11/11 with continued pins and needles type pain in the left breast, axilla, and now down the medial aspect of the left upper arm. She had a history of an acute pain around her left flank/chest last year. This had been painful along an area overlying the 8th-9th ribs. The pain initially started in the back and gradually shifted around anteriorly. She tried gabapentin, but this made her feel drunk (? reaction along with primidone). Topical capsaicin was not tolerable to her. We eventually settled on a regimen of amitriptyline 25 mg daily and CBD ointment. I referred her to physiatry. Dr. 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 She was tried on Cymbalta, but had worse shakes with this. By January, the pain resolved.  At her last visit, I had her take a course of prednisone she had at home. I also started her on a low dose of pregabalin. She notes today, her pain is 60% better, but that it remains present. She has a degree of allodynia of the left upper arm. She feels the pregabalin may be helping, but it does contribute to some drowsiness.  Past Medical History: Patient Active Problem List    Diagnosis Date Noted   Left Chest Neuritis (T1-T2) 05/28/2023   Mastalgia 05/09/2023   Overactive bladder 02/13/2023   Chronic right-sided low back pain with right-sided sciatica 08/15/2022   Postherpetic neuralgia 04/05/2022   History of breast cancer, right 08/12/2021   Adrenal nodule- likely benign adenoma (HCC) 04/12/2021   Aortic atherosclerosis (HCC) 04/12/2021   Malignant neoplasm of upper-outer quadrant of right breast in female, estrogen receptor positive (HCC) 03/28/2021   Hyperlipidemia 03/18/2021   Invasive ductal carcinoma of breast, female, right (HCC) 02/28/2021   Vitamin D deficiency 08/05/2019   Elevated blood pressure reading without diagnosis of hypertension 04/30/2019   Allergic rhinitis 10/28/2018   OSA (obstructive sleep apnea) 11/28/2017   Senile nuclear sclerosis 12/10/2014   Gait difficulty 03/27/2014   Osteopenia 10/31/2012   Gastro-esophageal reflux disease without esophagitis 06/25/2012   Fecal incontinence 06/25/2012   Thoracic radiculitis 07/20/2010   Diverticulosis of colon 09/26/2007   Depression with anxiety 09/26/2007   Enlarged lymph nodes 07/26/2007   History of colonic polyps 05/15/2007   Irritable bowel syndrome 05/15/2007   Tremor, essential 02/11/2007   Osteoarthritis of both hands 02/11/2007   Past Surgical History:  Procedure Laterality Date   arthroscopy left knee     arthroscopy right knee     bmp  07/17/2000   BREAST BIOPSY Right 03/23/2021   x2   BREAST LUMPECTOMY Right 04/20/2021   BREAST LUMPECTOMY WITH RADIOACTIVE SEED  AND SENTINEL LYMPH NODE BIOPSY Right 04/20/2021   Procedure: RIGHT BREAST LUMPECTOMY WITH RADIOACTIVE SEED AND SENTINEL LYMPH NODE BIOPSY;  Surgeon: Harriette Bouillon, MD;  Location: [facility];  Service: General;  Laterality: Right;   CATARACT EXTRACTION, BILATERAL     COLONOSCOPY W/ POLYPECTOMY     DILATION AND CURETTAGE OF UTERUS     RIGHT FOOT SURGERIES X 2     TOTAL KNEE ARTHROPLASTY   03/13/2012   Procedure: TOTAL KNEE ARTHROPLASTY;  Surgeon: Loanne Drilling, MD;  Location: [facility];  Service: Orthopedics;  Laterality: Left;  steroid injection right knee   TOTAL KNEE ARTHROPLASTY Right 08/04/2013   Procedure: RIGHT TOTAL KNEE ARTHROPLASTY;  Surgeon: Loanne Drilling, MD;  Location: [facility];  Service: Orthopedics;  Laterality: Right;   Family History  Problem Relation Age of Onset   Stroke Mother    Kidney disease Father    Cancer Maternal Grandmother        Leukemia   Kidney failure Other        renal disease   Outpatient Medications Prior to Visit  Medication Sig Dispense Refill   cetirizine (ZYRTEC) 10 MG tablet Take by mouth.     diazepam (VALIUM) 5 MG tablet TAKE 1 TABLET(5 MG) BY MOUTH EVERY 8 HOURS AS NEEDED FOR ANXIETY 90 tablet 0   hyoscyamine (LEVSIN) 0.125 MG tablet Take 1 tablet (0.125 mg total) by mouth every 4 (four) hours as needed for cramping. 60 tablet 1   Multiple Vitamin (MULTIVITAMIN) tablet Take 1 tablet by mouth daily.     omeprazole (PRILOSEC) 20 MG capsule Take 1 capsule (20 mg total) by mouth daily. 90 capsule 3   oxybutynin (DITROPAN-XL) 5 MG 24 hr tablet Take 1 tablet (5 mg total) by mouth at bedtime. 30 tablet 11   primidone (MYSOLINE) 250 MG tablet Take 1 tablet (250 mg total) by mouth 3 (three) times daily. 270 each 3   rosuvastatin (CRESTOR) 5 MG tablet Take 1 tablet (5 mg total) by mouth daily. 90 tablet 3   Vitamin D, Ergocalciferol, (DRISDOL) 1.25 MG (50000 UNIT) CAPS capsule TAKE 1 CAPSULE BY MOUTH EVERY 7 DAYS 12 capsule 0   pregabalin (LYRICA) 25 MG capsule Take 1 capsule (25 mg total) by mouth 2 (two) times daily. 60 capsule 0   No facility-administered medications prior to visit.   Allergies  Allergen Reactions   Memantine Swelling   Propranolol Other (See Comments)    Made her head feel very funny Made her head feel very funny    Codeine Nausea And Vomiting   Duloxetine     Tremor    Gabapentin (Once-Daily)     Made her  feel funny.   Lyrica [Pregabalin]     Made her feel funny   Sulfamethoxazole Rash    REACTION: unspecified     Objective:   Today's Vitals   06/18/23 1334  BP: 134/72  Pulse: 62  Temp: (!) 97.5 F (36.4 C)  TempSrc: Temporal  SpO2: 96%  Weight: 179 lb 12.8 oz (81.6 kg)  Height: 5\' 4"  (1.626 m)   Body mass index is 30.86 kg/m.   General: Well developed, well nourished. No acute distress. Psych: Alert and oriented. Normal mood and affect.  Health Maintenance Due  Topic Date Due   Zoster Vaccines- Shingrix (1 of 2) Never done   DTaP/Tdap/Td (2 - Tdap) 07/17/2002   Medicare Annual Wellness (AWV)  07/19/2022     Assessment & Plan:  Problem List Items Addressed This Visit       Nervous and Auditory   Left Chest Neuritis (T1-T2) - Primary    Ms. Babel appears to be having a neuritis of the T1-T2 nerve of the left chest. This is similar to an issue she had a year ago in the left T6-T7 level. I will continue pregabalin for pain management. I would consider increasing her dose, but she is already having some drowsiness with this. I will refer her to neurosurgery for an evaluation to see if she might be a candidate for a steroid injection or nerve ablation to provide relief.      Relevant Medications   pregabalin (LYRICA) 25 MG capsule   Other Relevant Orders   Ambulatory referral to Neurosurgery    Return for Follow-up after neurosurgical evaluation.Loyola Mast, MD

## 2023-06-18 NOTE — Progress Notes (Deleted)
[facility] [address] [place] [address] Dept: [phone] Dept Fax: [phone]  Chronic Care Office Visit  Subjective:    Patient ID: [name], female    DOB: 03-11-1934, 89 y.o..   MRN: [id number]  Chief Complaint  Patient presents with   Follow-up    3 month f/u.  C/o still having the pain in the LT breast/arm pain.      History of Present Illness:  Patient is in today for reassessment of chronic medical issues.  Past Medical History: Patient Active Problem List   Diagnosis Date Noted   Left Chest Neuritis (T1-T2) 05/28/2023   Mastalgia 05/09/2023   Overactive bladder 02/13/2023   Chronic right-sided low back pain with right-sided sciatica 08/15/2022   Postherpetic neuralgia 04/05/2022   History of breast cancer, right 08/12/2021   Adrenal nodule- likely benign adenoma (HCC) 04/12/2021   Aortic atherosclerosis (HCC) 04/12/2021   Malignant neoplasm of upper-outer quadrant of right breast in female, estrogen receptor positive (HCC) 03/28/2021   Hyperlipidemia 03/18/2021   Invasive ductal carcinoma of breast, female, right (HCC) 02/28/2021   Vitamin D deficiency 08/05/2019   Elevated blood pressure reading without diagnosis of hypertension 04/30/2019   Allergic rhinitis 10/28/2018   OSA (obstructive sleep apnea) 11/28/2017   Senile nuclear sclerosis 12/10/2014   Gait difficulty 03/27/2014   Osteopenia 10/31/2012   Gastro-esophageal reflux disease without esophagitis 06/25/2012   Fecal incontinence 06/25/2012   Thoracic radiculitis 07/20/2010   Diverticulosis of colon 09/26/2007   Depression with anxiety 09/26/2007   Enlarged lymph nodes 07/26/2007   History of colonic polyps 05/15/2007   Irritable bowel syndrome 05/15/2007   Tremor, essential 02/11/2007   Osteoarthritis of both hands 02/11/2007   Past Surgical History:  Procedure Laterality Date   arthroscopy left knee     arthroscopy right  knee     bmp  07/17/2000   BREAST BIOPSY Right 03/23/2021   x2   BREAST LUMPECTOMY Right 04/20/2021   BREAST LUMPECTOMY WITH RADIOACTIVE SEED AND SENTINEL LYMPH NODE BIOPSY Right 04/20/2021   Procedure: RIGHT BREAST LUMPECTOMY WITH RADIOACTIVE SEED AND SENTINEL LYMPH NODE BIOPSY;  Surgeon: Harriette Bouillon, MD;  Location: [facility];  Service: General;  Laterality: Right;   CATARACT EXTRACTION, BILATERAL     COLONOSCOPY W/ POLYPECTOMY     DILATION AND CURETTAGE OF UTERUS     RIGHT FOOT SURGERIES X 2     TOTAL KNEE ARTHROPLASTY  03/13/2012   Procedure: TOTAL KNEE ARTHROPLASTY;  Surgeon: Loanne Drilling, MD;  Location: [facility];  Service: Orthopedics;  Laterality: Left;  steroid injection right knee   TOTAL KNEE ARTHROPLASTY Right 08/04/2013   Procedure: RIGHT TOTAL KNEE ARTHROPLASTY;  Surgeon: Loanne Drilling, MD;  Location: [facility];  Service: Orthopedics;  Laterality: Right;   Family History  Problem Relation Age of Onset   Stroke Mother    Kidney disease Father    Cancer Maternal Grandmother        Leukemia   Kidney failure Other        renal disease   Outpatient Medications Prior to Visit  Medication Sig Dispense Refill   cetirizine (ZYRTEC) 10 MG tablet Take by mouth.     diazepam (VALIUM) 5 MG tablet TAKE 1 TABLET(5 MG) BY MOUTH EVERY 8 HOURS AS NEEDED FOR ANXIETY 90 tablet 0   hyoscyamine (LEVSIN) 0.125 MG tablet Take 1 tablet (0.125 mg total) by mouth every 4 (four) hours as needed for  cramping. 60 tablet 1   Multiple Vitamin (MULTIVITAMIN) tablet Take 1 tablet by mouth daily.     omeprazole (PRILOSEC) 20 MG capsule Take 1 capsule (20 mg total) by mouth daily. 90 capsule 3   oxybutynin (DITROPAN-XL) 5 MG 24 hr tablet Take 1 tablet (5 mg total) by mouth at bedtime. 30 tablet 11   primidone (MYSOLINE) 250 MG tablet Take 1 tablet (250 mg total) by mouth 3 (three) times daily. 270 each 3   rosuvastatin (CRESTOR) 5 MG tablet Take 1 tablet (5 mg total) by mouth daily. 90  tablet 3   Vitamin D, Ergocalciferol, (DRISDOL) 1.25 MG (50000 UNIT) CAPS capsule TAKE 1 CAPSULE BY MOUTH EVERY 7 DAYS 12 capsule 0   pregabalin (LYRICA) 25 MG capsule Take 1 capsule (25 mg total) by mouth 2 (two) times daily. 60 capsule 0   No facility-administered medications prior to visit.   Allergies  Allergen Reactions   Memantine Swelling   Propranolol Other (See Comments)    Made her head feel very funny Made her head feel very funny    Codeine Nausea And Vomiting   Duloxetine     Tremor    Gabapentin (Once-Daily)     Made her feel funny.   Lyrica [Pregabalin]     Made her feel funny   Sulfamethoxazole Rash    REACTION: unspecified   Objective:   Today's Vitals   06/18/23 1334  BP: 134/72  Pulse: 62  Temp: (!) 97.5 F (36.4 C)  TempSrc: Temporal  SpO2: 96%  Weight: 179 lb 12.8 oz (81.6 kg)  Height: 5\' 4"  (1.626 m)   Body mass index is 30.86 kg/m.   General: Well developed, well nourished. No acute distress. HEENT: Normocephalic, non-traumatic. External ears normal. EAC and TMs normal bilaterally. PERRL, EOMI. Conjunctiva clear. Nose   clear without congestion or rhinorrhea. Mucous membranes moist. Oropharynx clear. Good dentition. Neck: Supple. No lymphadenopathy. No thyromegaly. Lungs: Clear to auscultation bilaterally. No wheezing, rales or rhonchi. CV: RRR without murmurs or rubs. Pulses 2+ bilaterally. Abdomen: Soft, non-tender. Bowel sounds positive, normal pitch and frequency. No hepatosplenomegaly. No rebound or guarding. Back: Straight. No CVA tenderness bilaterally. Extremities: Full ROM. No joint swelling or tenderness. No edema noted. Skin: Warm and dry. No rashes. Neuro: CN II-XII intact. Normal sensation and DTR bilaterally. Psych: Alert and oriented. Normal mood and affect.  Health Maintenance Due  Topic Date Due   Zoster Vaccines- Shingrix (1 of 2) Never done   DTaP/Tdap/Td (2 - Tdap) 07/17/2002   Medicare Annual Wellness (AWV)   07/19/2022    Lab Results {Labs (Optional):29002}    Assessment & Plan:   Problem List Items Addressed This Visit       Nervous and Auditory   Left Chest Neuritis (T1-T2) - Primary   Relevant Medications   pregabalin (LYRICA) 25 MG capsule   Other Relevant Orders   Ambulatory referral to Neurosurgery    Return for Follow-up after neurosurgical evaluation.Loyola Mast, MD

## 2023-06-18 NOTE — Assessment & Plan Note (Signed)
[name] appears to be having a neuritis of the T1-T2 nerve of the left chest. This is similar to an issue she had a year ago in the left T6-T7 level. I will continue pregabalin for pain management. I would consider increasing her dose, but she is already having some drowsiness with this. I will refer her to neurosurgery for an evaluation to see if she might be a candidate for a steroid injection or nerve ablation to provide relief.

## 2023-06-25 ENCOUNTER — Ambulatory Visit: Payer: Medicare Other | Attending: Surgery

## 2023-06-25 VITALS — Wt 179.2 lb

## 2023-06-25 DIAGNOSIS — Z483 Aftercare following surgery for neoplasm: Secondary | ICD-10-CM | POA: Insufficient documentation

## 2023-06-25 NOTE — Therapy (Addendum)
OUTPATIENT PHYSICAL THERAPY SOZO SCREENING NOTE   Patient Name: [name] MRN: [id number] DOB:[birth year], 89 y.o., female Today's Date: 06/25/2023  PCP: Loyola Mast, MD REFERRING PROVIDER: Harriette Bouillon, MD   PT End of Session - 06/25/23 1542     Visit Number 1   # unchanged due to screen only   PT Start Time 1541    PT Stop Time 1545    PT Time Calculation (min) 4 min    Activity Tolerance Patient tolerated treatment well    Behavior During Therapy [facility] for tasks assessed/performed              Past Medical History:  Diagnosis Date   Abnormal results of liver function studies 05/15/2007   Allergy    Anal fissure 09/26/2007   Formatting of this note might be different from the original. Qualifier: Diagnosis of  By: Koleen Distance CMA (AAMA), Leisha   Anxiety    B12 deficiency 05/05/2020   Chicken pox    Essential tremor    of head, sometimes hands.  pt takes primidone to help the tremors-DR. MILLER-NEUROLOGIST IN [place].  PT WAS STARTED ON VALUIM MANY YEARS AGO FOR HER TREMORS--AND CONTINUES TO TAKE.   Fracture of humerus, proximal, left, closed 10/28/2011   pt has finished physical therapy-limited ROM reaching to her back and unable to lift heavy things with left hand/arm   GERD (gastroesophageal reflux disease)    H/O hiatal hernia    Hereditary and idiopathic peripheral neuropathy 03/27/2014   Hx of colonic polyps    Osteoarthritis    PAIN AND OA BILATERAL KNEES; ALSO HAS ARTHRITIS IN HANDS AND BACK BUT NO BACK PAIN   Other chronic otitis externa 03/01/2009   Formatting of this note might be different from the original. Qualifier: Diagnosis of  By: Lovell Sheehan MD, Balinda Quails   Past Surgical History:  Procedure Laterality Date   arthroscopy left knee     arthroscopy right knee     bmp  07/17/2000   BREAST BIOPSY Right 03/23/2021   x2   BREAST LUMPECTOMY Right 04/20/2021   BREAST LUMPECTOMY WITH RADIOACTIVE SEED AND SENTINEL LYMPH NODE BIOPSY Right 04/20/2021    Procedure: RIGHT BREAST LUMPECTOMY WITH RADIOACTIVE SEED AND SENTINEL LYMPH NODE BIOPSY;  Surgeon: Harriette Bouillon, MD;  Location: [facility];  Service: General;  Laterality: Right;   CATARACT EXTRACTION, BILATERAL     COLONOSCOPY W/ POLYPECTOMY     DILATION AND CURETTAGE OF UTERUS     RIGHT FOOT SURGERIES X 2     TOTAL KNEE ARTHROPLASTY  03/13/2012   Procedure: TOTAL KNEE ARTHROPLASTY;  Surgeon: Loanne Drilling, MD;  Location: [facility];  Service: Orthopedics;  Laterality: Left;  steroid injection right knee   TOTAL KNEE ARTHROPLASTY Right 08/04/2013   Procedure: RIGHT TOTAL KNEE ARTHROPLASTY;  Surgeon: Loanne Drilling, MD;  Location: [facility];  Service: Orthopedics;  Laterality: Right;   Patient Active Problem List   Diagnosis Date Noted   Left Chest Neuritis (T1-T2) 05/28/2023   Mastalgia 05/09/2023   Overactive bladder 02/13/2023   Chronic right-sided low back pain with right-sided sciatica 08/15/2022   Postherpetic neuralgia 04/05/2022   History of breast cancer, right 08/12/2021   Adrenal nodule- likely benign adenoma (HCC) 04/12/2021   Aortic atherosclerosis (HCC) 04/12/2021   Malignant neoplasm of upper-outer quadrant of right breast in female, estrogen receptor positive (HCC) 03/28/2021   Hyperlipidemia 03/18/2021   Invasive ductal carcinoma of breast, female, right (HCC) 02/28/2021  Vitamin D deficiency 08/05/2019   Elevated blood pressure reading without diagnosis of hypertension 04/30/2019   Allergic rhinitis 10/28/2018   OSA (obstructive sleep apnea) 11/28/2017   Senile nuclear sclerosis 12/10/2014   Gait difficulty 03/27/2014   Osteopenia 10/31/2012   Gastro-esophageal reflux disease without esophagitis 06/25/2012   Fecal incontinence 06/25/2012   Thoracic radiculitis 07/20/2010   Diverticulosis of colon 09/26/2007   Depression with anxiety 09/26/2007   Enlarged lymph nodes 07/26/2007   History of colonic polyps 05/15/2007   Irritable bowel syndrome  05/15/2007   Tremor, essential 02/11/2007   Osteoarthritis of both hands 02/11/2007    REFERRING DIAG: right breast cancer at risk for lymphedema  THERAPY DIAG: Aftercare following surgery for neoplasm  PERTINENT HISTORY: Patient was diagnosed on 02/28/2021 with right grade II invasive ductal carcinoma breast cancer. She underwent a right lumpectomy and sentinel node biopsy (5 negative nodes) on 04/20/2021. It is ER/PR positive and HER2 negative with a Ki67 of 1%. She had bilateral knee replacements 10 years ago.   PRECAUTIONS: right UE Lymphedema risk, None  SUBJECTIVE: Pt returns for her 3 month L-Dex screen. "I've been feeling like a stinging in my medial upper arm since about 4 weeks ago and my breast has been swollen but it's on my Lt side. My oncologist ran some tests and it's not cancer so he is sending me to a neurologist."   PAIN:  Are you having pain? No  SOZO SCREENING: Patient was assessed today using the SOZO machine to determine the lymphedema index score. This was compared to her baseline score. It was determined that she is within the recommended range when compared to her baseline and no further action is needed at this time. She will continue SOZO screenings. These are done every 3 months for 2 years post operatively followed by every 6 months for 2 years, and then annually.  P: Pt would like to begin 6 month L-Dex screens next.    L-DEX FLOWSHEETS - 06/25/23 1500       L-DEX LYMPHEDEMA SCREENING   Measurement Type Unilateral    L-DEX MEASUREMENT EXTREMITY Upper Extremity    POSITION  Standing    DOMINANT SIDE Right    At Risk Side Right    BASELINE SCORE (UNILATERAL) 3.9    L-DEX SCORE (UNILATERAL) -0.1    VALUE CHANGE (UNILAT) -4                 Hermenia Bers, PTA 06/25/2023, 3:43 PM

## 2023-06-29 ENCOUNTER — Telehealth: Payer: Self-pay

## 2023-06-29 NOTE — Telephone Encounter (Signed)
Spoke to [place] with Neurosurgery.  Answered questions regarding as to why she was referred to them .  Advised of patients nerve symptoms and she will speak to someone to see if this is something they can see or if it needs to go to another office?  She will call back and let us know. Dm/cma

## 2023-06-29 NOTE — Telephone Encounter (Signed)
Rene Kocher called back today @ 2:27 and said she spoke to the neuro dr and they think this is a pain management issue so sent the referral to dr Lorrine Kin at there office

## 2023-07-17 ENCOUNTER — Other Ambulatory Visit: Payer: Self-pay | Admitting: Family

## 2023-07-17 DIAGNOSIS — G250 Essential tremor: Secondary | ICD-10-CM

## 2023-07-24 ENCOUNTER — Other Ambulatory Visit: Payer: Self-pay | Admitting: Family

## 2023-07-24 ENCOUNTER — Other Ambulatory Visit: Payer: Self-pay | Admitting: Family Medicine

## 2023-07-24 DIAGNOSIS — G250 Essential tremor: Secondary | ICD-10-CM

## 2023-08-03 ENCOUNTER — Other Ambulatory Visit: Payer: Self-pay | Admitting: Family Medicine

## 2023-08-03 DIAGNOSIS — E559 Vitamin D deficiency, unspecified: Secondary | ICD-10-CM

## 2023-08-08 ENCOUNTER — Ambulatory Visit: Payer: Medicare Other | Admitting: Family Medicine

## 2023-08-14 ENCOUNTER — Encounter: Payer: Self-pay | Admitting: Family Medicine

## 2023-08-14 ENCOUNTER — Ambulatory Visit: Payer: Medicare Other | Admitting: Family Medicine

## 2023-08-14 VITALS — BP 130/74 | HR 61 | Temp 97.9°F | Ht 64.0 in | Wt 179.4 lb

## 2023-08-14 DIAGNOSIS — Z8739 Personal history of other diseases of the musculoskeletal system and connective tissue: Secondary | ICD-10-CM

## 2023-08-14 DIAGNOSIS — E559 Vitamin D deficiency, unspecified: Secondary | ICD-10-CM | POA: Diagnosis not present

## 2023-08-14 DIAGNOSIS — M792 Neuralgia and neuritis, unspecified: Secondary | ICD-10-CM

## 2023-08-14 LAB — VITAMIN D 25 HYDROXY (VIT D DEFICIENCY, FRACTURES): VITD: 24.78 ng/mL — ABNORMAL LOW (ref 30.00–100.00)

## 2023-08-14 MED ORDER — VITAMIN D (ERGOCALCIFEROL) 1.25 MG (50000 UNIT) PO CAPS: 50000.0000 [IU] | ORAL_CAPSULE | ORAL | 0 refills | Status: DC

## 2023-08-14 NOTE — Assessment & Plan Note (Signed)
Resolved. Now off of pregabalin. We will manage this expectantly for now.

## 2023-08-14 NOTE — Addendum Note (Signed)
Addended by: Loyola Mast on: 08/14/2023 04:50 PM   Modules accepted: Orders

## 2023-08-14 NOTE — Assessment & Plan Note (Signed)
I will reassess her Vitamin D level to see if this remains an issue. If this remains low, I will continue Vitamin D 50,000 units weekly.

## 2023-08-14 NOTE — Progress Notes (Signed)
[facility] PRIMARY CARE [facility] [address] [place] [address] Dept: [phone] Dept Fax: [phone]  Chronic Care Office Visit  Subjective:    Patient ID: [name], female    DOB: 06/02/1934, 89 y.o..   MRN: [id number]  Chief Complaint  Patient presents with   Follow-up    F/u  meds.    History of Present Illness:  Patient is in today for reassessment of chronic medical issues.  [name] returns to follow-up today regarding her left chest neuritis.  I saw her on 10/23 with a month's history of left breast pain radiating into the axilla. She has a past history of a right localized breast cancer that was managed with lumpectomy. I ordered labs, a breast ultrasound, and recommended she use Aleve for breast pain. The labs and x-ray returned normal.    I saw [name] back on 11/11 with continued pins and needles type pain in the left breast, axilla, and then down the medial aspect of the left upper arm. She had a history of an acute pain around her left flank/chest in 2023. This had been painful along an area overlying the 8th-9th ribs. The pain initially started in the back and gradually shifted around anteriorly. She tried gabapentin, but this made her feel drunk (? reaction along with primidone). Topical capsaicin was not tolerable to her. We eventually settled on a regimen of amitriptyline 25 mg daily and CBD ointment. I referred her to physiatry. Dr. Natale Lay felt she may have zoster sin herpete. She was tried on Cymbalta, but had worse shakes with this. By January, the pain resolved.   In Nov., I had her take a course of prednisone and started her on a low dose of pregabalin. She noted about 60% improvement in her pain, but with a degree of allodynia of the left upper arm. Since then, her pain has resolved. She is no longer taking the pregabalin and feels she is doing well.   [name] has ahd a past history of Vitamin D  deficiency. She has been on a replacement dosage of Vitamin D.  Past Medical History: Patient Active Problem List   Diagnosis Date Noted   Left Chest Neuritis (T1-T2) 05/28/2023   Mastalgia 05/09/2023   Overactive bladder 02/13/2023   Chronic right-sided low back pain with right-sided sciatica 08/15/2022   Postherpetic neuralgia 04/05/2022   History of breast cancer, right 08/12/2021   Adrenal nodule- likely benign adenoma (HCC) 04/12/2021   Aortic atherosclerosis (HCC) 04/12/2021   Malignant neoplasm of upper-outer quadrant of right breast in female, estrogen receptor positive (HCC) 03/28/2021   Hyperlipidemia 03/18/2021   Invasive ductal carcinoma of breast, female, right (HCC) 02/28/2021   Vitamin D deficiency 08/05/2019   Elevated blood pressure reading without diagnosis of hypertension 04/30/2019   Allergic rhinitis 10/28/2018   OSA (obstructive sleep apnea) 11/28/2017   Senile nuclear sclerosis 12/10/2014   Gait difficulty 03/27/2014   Osteopenia 10/31/2012   Gastro-esophageal reflux disease without esophagitis 06/25/2012   Fecal incontinence 06/25/2012   Thoracic radiculitis 07/20/2010   Diverticulosis of colon 09/26/2007   Depression with anxiety 09/26/2007   Enlarged lymph nodes 07/26/2007   History of colonic polyps 05/15/2007   Irritable bowel syndrome 05/15/2007   Tremor, essential 02/11/2007   Osteoarthritis of both hands 02/11/2007   Past Surgical History:  Procedure Laterality Date   arthroscopy left knee     arthroscopy right knee     bmp  07/17/2000   BREAST  BIOPSY Right 03/23/2021   x2   BREAST LUMPECTOMY Right 04/20/2021   BREAST LUMPECTOMY WITH RADIOACTIVE SEED AND SENTINEL LYMPH NODE BIOPSY Right 04/20/2021   Procedure: RIGHT BREAST LUMPECTOMY WITH RADIOACTIVE SEED AND SENTINEL LYMPH NODE BIOPSY;  Surgeon: Harriette Bouillon, MD;  Location: [facility];  Service: General;  Laterality: Right;   CATARACT EXTRACTION, BILATERAL     COLONOSCOPY  W/ POLYPECTOMY     DILATION AND CURETTAGE OF UTERUS     RIGHT FOOT SURGERIES X 2     TOTAL KNEE ARTHROPLASTY  03/13/2012   Procedure: TOTAL KNEE ARTHROPLASTY;  Surgeon: Loanne Drilling, MD;  Location: [facility];  Service: Orthopedics;  Laterality: Left;  steroid injection right knee   TOTAL KNEE ARTHROPLASTY Right 08/04/2013   Procedure: RIGHT TOTAL KNEE ARTHROPLASTY;  Surgeon: Loanne Drilling, MD;  Location: [facility];  Service: Orthopedics;  Laterality: Right;   Family History  Problem Relation Age of Onset   Stroke Mother    Kidney disease Father    Cancer Maternal Grandmother        Leukemia   Kidney failure Other        renal disease   Outpatient Medications Prior to Visit  Medication Sig Dispense Refill   cetirizine (ZYRTEC) 10 MG tablet Take by mouth.     diazepam (VALIUM) 5 MG tablet TAKE 1 TABLET(5 MG) BY MOUTH EVERY 8 HOURS AS NEEDED FOR ANXIETY 90 tablet 2   hyoscyamine (LEVSIN) 0.125 MG tablet Take 1 tablet (0.125 mg total) by mouth every 4 (four) hours as needed for cramping. 60 tablet 1   Multiple Vitamin (MULTIVITAMIN) tablet Take 1 tablet by mouth daily.     omeprazole (PRILOSEC) 20 MG capsule Take 1 capsule (20 mg total) by mouth daily. 90 capsule 3   primidone (MYSOLINE) 250 MG tablet Take 1 tablet (250 mg total) by mouth 3 (three) times daily. 270 each 3   Vitamin D, Ergocalciferol, (DRISDOL) 1.25 MG (50000 UNIT) CAPS capsule TAKE 1 CAPSULE BY MOUTH EVERY 7 DAYS (Patient not taking: Reported on 08/14/2023) 12 capsule 0   oxybutynin (DITROPAN-XL) 5 MG 24 hr tablet Take 1 tablet (5 mg total) by mouth at bedtime. (Patient not taking: Reported on 08/14/2023) 30 tablet 11   pregabalin (LYRICA) 25 MG capsule Take 1 capsule (25 mg total) by mouth 2 (two) times daily. (Patient not taking: Reported on 08/14/2023) 60 capsule 2   rosuvastatin (CRESTOR) 5 MG tablet Take 1 tablet (5 mg total) by mouth daily. (Patient not taking: Reported on 08/14/2023) 90 tablet 3   No facility-administered  medications prior to visit.   Allergies  Allergen Reactions   Memantine Swelling   Propranolol Other (See Comments)    Made her head feel very funny Made her head feel very funny    Codeine Nausea And Vomiting   Duloxetine     Tremor    Gabapentin (Once-Daily)     Made her feel funny.   Lyrica [Pregabalin]     Made her feel funny   Sulfamethoxazole Rash    REACTION: unspecified   Objective:   Today's Vitals   08/14/23 1319  BP: 130/74  Pulse: 61  Temp: 97.9 F (36.6 C)  TempSrc: Temporal  SpO2: 95%  Weight: 179 lb 6.4 oz (81.4 kg)  Height: 5\' 4"  (1.626 m)   Body mass index is 30.79 kg/m.   General: Well developed, well nourished. No acute distress. Psych: Alert and oriented. Normal mood and affect.  Health  Maintenance Due  Topic Date Due   Zoster Vaccines- Shingrix (1 of 2) Never done   DTaP/Tdap/Td (2 - Tdap) 07/17/2002   Medicare Annual Wellness (AWV)  07/19/2022     Assessment & Plan:   Problem List Items Addressed This Visit       Nervous and Auditory   Left Chest Neuritis (T1-T2) - Primary   Resolved. Now off of pregabalin. We will manage this expectantly for now.        Other   Vitamin D deficiency   I will reassess her Vitamin D level to see if this remains an issue. If this remains low, I will continue Vitamin D 50,000 units weekly.      Relevant Orders   VITAMIN D 25 Hydroxy (Vit-D Deficiency, Fractures)    Return in about 4 months (around 12/12/2023) for Reassessment.   Loyola Mast, MD

## 2023-08-15 ENCOUNTER — Other Ambulatory Visit: Payer: Self-pay | Admitting: Family Medicine

## 2023-08-15 DIAGNOSIS — K219 Gastro-esophageal reflux disease without esophagitis: Secondary | ICD-10-CM

## 2023-09-05 ENCOUNTER — Ambulatory Visit: Payer: Self-pay | Admitting: Family Medicine

## 2023-09-05 NOTE — Telephone Encounter (Signed)
 Noted. Dm/cma

## 2023-09-05 NOTE — Telephone Encounter (Signed)
  Chief Complaint: cough Symptoms: cough, runny nose, yellow sputum Frequency: started Friday Pertinent Negatives: Patient denies fever, sob Disposition: [] ED /[] Urgent Care (no appt availability in office) / [x] Appointment(In office/virtual)/ []  [facility]/ [] Home Care/ [] Refused Recommended Disposition /[] [facility]/ []  Follow-up with PCP Additional Notes: Instructed to go to the UC for treatment due to no apt today; care advice given denies questions, pcp office updated, instructed to go to the er if becomes worse.   Reason for Disposition  [1] Continuous (nonstop) coughing interferes with work or school AND [2] no improvement using cough treatment per Care Advice  Answer Assessment - Initial Assessment Questions 1. ONSET: "When did the cough begin?"     Friday 2. SEVERITY: "How bad is the cough today?"      severe 3. SPUTUM: "Describe the color of your sputum" (none, dry cough; clear, white, yellow, green)     yellow 4. HEMOPTYSIS: "Are you coughing up any blood?" If so ask: "How much?" (flecks, streaks, tablespoons, etc.)     denies 5. DIFFICULTY BREATHING: "Are you having difficulty breathing?" If Yes, ask: "How bad is it?" (e.g., mild, moderate, severe)    - MILD: No SOB at rest, mild SOB with walking, speaks normally in sentences, can lie down, no retractions, pulse < 100.    - MODERATE: SOB at rest, SOB with minimal exertion and prefers to sit, cannot lie down flat, speaks in phrases, mild retractions, audible wheezing, pulse 100-120.    - SEVERE: Very SOB at rest, speaks in single words, struggling to breathe, sitting hunched forward, retractions, pulse > 120      Sometimes, "I have moments" 6. FEVER: "Do you have a fever?" If Yes, ask: "What is your temperature, how was it measured, and when did it start?"     denies 7. CARDIAC HISTORY: "Do you have any history of heart disease?" (e.g., heart attack, congestive heart failure)      Heart disease 8. LUNG  HISTORY: "Do you have any history of lung disease?"  (e.g., pulmonary embolus, asthma, emphysema)     denies 9. PE RISK FACTORS: "Do you have a history of blood clots?" (or: recent major surgery, recent prolonged travel, bedridden)     denies 10. OTHER SYMPTOMS: "Do you have any other symptoms?" (e.g., runny nose, wheezing, chest pain)       Runny nose,  11. PREGNANCY: "Is there any chance you are pregnant?" "When was your last menstrual period?"       na 12. TRAVEL: "Have you traveled out of the country in the last month?" (e.g., travel history, exposures)       na  Protocols used: Cough - Acute Productive-A-AH

## 2023-09-07 ENCOUNTER — Telehealth: Payer: Self-pay | Admitting: Family Medicine

## 2023-09-07 NOTE — Telephone Encounter (Signed)
Patient Is wanting a call back from denise today

## 2023-09-07 NOTE — Telephone Encounter (Signed)
Returned call to patient informing her [name] and Dr. Veto Kemps are out of the office.  The patient stated she was not feeling well and wanted an Rx sent to the pharmacy,  I offered a virtual appt but patient declined and said that she would seek treatment if symptoms got worse.

## 2023-09-14 ENCOUNTER — Ambulatory Visit: Payer: Medicare Other | Admitting: Family Medicine

## 2023-09-14 ENCOUNTER — Other Ambulatory Visit: Payer: Self-pay

## 2023-09-14 ENCOUNTER — Ambulatory Visit (HOSPITAL_BASED_OUTPATIENT_CLINIC_OR_DEPARTMENT_OTHER)
Admission: RE | Admit: 2023-09-14 | Discharge: 2023-09-14 | Disposition: A | Discharge status: Home / Self Care | Payer: Medicare Other | Source: Ambulatory Visit | Attending: Internal Medicine | Admitting: Internal Medicine

## 2023-09-14 ENCOUNTER — Encounter: Payer: Self-pay | Admitting: Emergency Medicine

## 2023-09-14 ENCOUNTER — Ambulatory Visit
Admission: EM | Admit: 2023-09-14 | Discharge: 2023-09-14 | Disposition: A | Discharge status: Home / Self Care | Payer: Medicare Other | Attending: Internal Medicine | Admitting: Internal Medicine

## 2023-09-14 DIAGNOSIS — J019 Acute sinusitis, unspecified: Secondary | ICD-10-CM

## 2023-09-14 DIAGNOSIS — R059 Cough, unspecified: Secondary | ICD-10-CM | POA: Insufficient documentation

## 2023-09-14 DIAGNOSIS — R051 Acute cough: Secondary | ICD-10-CM

## 2023-09-14 DIAGNOSIS — R0602 Shortness of breath: Secondary | ICD-10-CM | POA: Insufficient documentation

## 2023-09-14 MED ORDER — AMOXICILLIN-POT CLAVULANATE 875-125 MG PO TABS: 1.0000 | ORAL_TABLET | Freq: Two times a day (BID) | ORAL | 0 refills | Status: DC

## 2023-09-14 NOTE — Discharge Instructions (Addendum)
 Please go to med [facility] and have x-ray performed. Do not check in to the ER. Go to imaging department, get images performed, then go home. You will receive a phone call if the x-ray shows any abnormal results requiring further treatment. If the x-ray results do not change our treatment plan, you will not receive a phone call.  Also see these results on MyChart.  Med [facility] [address] [place], [place]  Your evaluation shows you have a bacterial sinus infection. - Take antibiotic sent to pharmacy as directed to treat sinus infection. - Purchase Mucinex over the counter and take this every 12 hours as needed for nasal congestion and cough. - Warm compresses to the cheeks and forehead as needed to help with sinus headaches as well as tylenol as needed.  If you develop any new or worsening symptoms or if your symptoms do not start to improve, please return here or follow-up with your primary care provider. If your symptoms are severe, please go to the emergency room.

## 2023-09-14 NOTE — ED Provider Notes (Signed)
 [name]    CSN: [id number] Arrival date & time: 09/14/23  1208      History   Chief Complaint Chief Complaint  Patient presents with   Cough    HPI [name] is a 89 y.o. female.   [name] is a 89 y.o. female presenting for chief complaint of cough, nasal congestion, and generalized fatigue that started 2 weeks ago. Cough is productive with clear/cloudy sputum. Cough has since improved but she continues to have sinus congestion and "tickle cough". Sinus congestion is yellow, thick, and sometimes clear. Reports bilateral maxillary sinus pressure. Denies recent fever, chills, chest pain, heart palpitations, leg swelling, orthopnea, dizziness, N/V/D, abdominal pain, and rash. Her children are sick with similar symptoms. Denies recent antibiotic/steroid use. No history of chronic respiratory problems. She has been taking dayquil for symptoms with some relief.    Cough   Past Medical History:  Diagnosis Date   Abnormal results of liver function studies 05/15/2007   Allergy    Anal fissure 09/26/2007   Formatting of this note might be different from the original. Qualifier: Diagnosis of  By: Koleen Distance CMA (AAMA), Leisha   Anxiety    B12 deficiency 05/05/2020   Chicken pox    Essential tremor    of head, sometimes hands.  pt takes primidone to help the tremors-DR. MILLER-NEUROLOGIST IN [place].  PT WAS STARTED ON VALUIM MANY YEARS AGO FOR HER TREMORS--AND CONTINUES TO TAKE.   Fracture of humerus, proximal, left, closed 10/28/2011   pt has finished physical therapy-limited ROM reaching to her back and unable to lift heavy things with left hand/arm   GERD (gastroesophageal reflux disease)    H/O hiatal hernia    Hereditary and idiopathic peripheral neuropathy 03/27/2014   Hx of colonic polyps    Osteoarthritis    PAIN AND OA BILATERAL KNEES; ALSO HAS ARTHRITIS IN HANDS AND BACK BUT NO BACK PAIN   Other chronic otitis externa 03/01/2009   Formatting  of this note might be different from the original. Qualifier: Diagnosis of  By: Lovell Sheehan MD, Balinda Quails    Patient Active Problem List   Diagnosis Date Noted   Left Chest Neuritis (T1-T2) 05/28/2023   Mastalgia 05/09/2023   Overactive bladder 02/13/2023   Chronic right-sided low back pain with right-sided sciatica 08/15/2022   Postherpetic neuralgia 04/05/2022   History of breast cancer, right 08/12/2021   Adrenal nodule- likely benign adenoma (HCC) 04/12/2021   Aortic atherosclerosis (HCC) 04/12/2021   Malignant neoplasm of upper-outer quadrant of right breast in female, estrogen receptor positive (HCC) 03/28/2021   Hyperlipidemia 03/18/2021   Invasive ductal carcinoma of breast, female, right (HCC) 02/28/2021   Vitamin D deficiency 08/05/2019   Elevated blood pressure reading without diagnosis of hypertension 04/30/2019   Allergic rhinitis 10/28/2018   OSA (obstructive sleep apnea) 11/28/2017   Senile nuclear sclerosis 12/10/2014   Gait difficulty 03/27/2014   Osteopenia 10/31/2012   Gastro-esophageal reflux disease without esophagitis 06/25/2012   Fecal incontinence 06/25/2012   Thoracic radiculitis 07/20/2010   Diverticulosis of colon 09/26/2007   Depression with anxiety 09/26/2007   Enlarged lymph nodes 07/26/2007   History of colonic polyps 05/15/2007   Irritable bowel syndrome 05/15/2007   Tremor, essential 02/11/2007   Osteoarthritis of both hands 02/11/2007    Past Surgical History:  Procedure Laterality Date   arthroscopy left knee     arthroscopy right knee     bmp  07/17/2000   BREAST BIOPSY Right 03/23/2021  x2   BREAST LUMPECTOMY Right 04/20/2021   BREAST LUMPECTOMY WITH RADIOACTIVE SEED AND SENTINEL LYMPH NODE BIOPSY Right 04/20/2021   Procedure: RIGHT BREAST LUMPECTOMY WITH RADIOACTIVE SEED AND SENTINEL LYMPH NODE BIOPSY;  Surgeon: Harriette Bouillon, MD;  Location: [facility];  Service: General;  Laterality: Right;   CATARACT EXTRACTION, BILATERAL      COLONOSCOPY W/ POLYPECTOMY     DILATION AND CURETTAGE OF UTERUS     RIGHT FOOT SURGERIES X 2     TOTAL KNEE ARTHROPLASTY  03/13/2012   Procedure: TOTAL KNEE ARTHROPLASTY;  Surgeon: Loanne Drilling, MD;  Location: [facility];  Service: Orthopedics;  Laterality: Left;  steroid injection right knee   TOTAL KNEE ARTHROPLASTY Right 08/04/2013   Procedure: RIGHT TOTAL KNEE ARTHROPLASTY;  Surgeon: Loanne Drilling, MD;  Location: [facility];  Service: Orthopedics;  Laterality: Right;    OB History   No obstetric history on file.      Home Medications    Prior to Admission medications   Medication Sig Start Date End Date Taking? Authorizing Provider  amoxicillin-clavulanate (AUGMENTIN) 875-125 MG tablet Take 1 tablet by mouth every 12 (twelve) hours. 09/14/23  Yes Carlisle Beers, FNP  cetirizine (ZYRTEC) 10 MG tablet Take by mouth. 09/20/15   [provider]  diazepam (VALIUM) 5 MG tablet TAKE 1 TABLET(5 MG) BY MOUTH EVERY 8 HOURS AS NEEDED FOR ANXIETY 07/24/23   Loyola Mast, MD  hyoscyamine (LEVSIN) 0.125 MG tablet Take 1 tablet (0.125 mg total) by mouth every 4 (four) hours as needed for cramping. 10/24/19   CiriglianoJearld Lesch, DO  Multiple Vitamin (MULTIVITAMIN) tablet Take 1 tablet by mouth daily.    [provider]  omeprazole (PRILOSEC) 20 MG capsule TAKE 1 CAPSULE(20 MG) BY MOUTH DAILY 08/15/23   Loyola Mast, MD  primidone (MYSOLINE) 250 MG tablet Take 1 tablet (250 mg total) by mouth 3 (three) times daily. 08/15/22   Loyola Mast, MD  Vitamin D, Ergocalciferol, (DRISDOL) 1.25 MG (50000 UNIT) CAPS capsule Take 1 capsule (50,000 Units total) by mouth every 7 (seven) days. 08/14/23   Loyola Mast, MD    Family History Family History  Problem Relation Age of Onset   Stroke Mother    Kidney disease Father    Cancer Maternal Grandmother        Leukemia   Kidney failure Other        renal disease    Social History Social History   Tobacco Use   Smoking  status: Never   Smokeless tobacco: Never  Vaping Use   Vaping status: Never Used  Substance Use Topics   Alcohol use: Yes    Comment: Rare   Drug use: No     Allergies   Memantine, Propranolol, Codeine, Duloxetine, Gabapentin (once-daily), Lyrica [pregabalin], and Sulfamethoxazole   Review of Systems Review of Systems  Respiratory:  Positive for cough.   Per HPI   Physical Exam Triage Vital Signs ED Triage Vitals  Encounter Vitals Group     BP 09/14/23 1213 (!) 156/80     Systolic BP Percentile --      Diastolic BP Percentile --      Pulse Rate 09/14/23 1213 74     Resp 09/14/23 1213 19     Temp 09/14/23 1213 98.2 F (36.8 C)     Temp Source 09/14/23 1213 Oral     SpO2 09/14/23 1213 95 %     Weight --  Height --      Head Circumference --      Peak Flow --      Pain Score 09/14/23 1217 0     Pain Loc --      Pain Education --      Exclude from Growth Chart --    No data found.  Updated Vital Signs BP (!) 156/80 (BP Location: Right Arm)   Pulse 74   Temp 98.2 F (36.8 C) (Oral)   Resp 19   SpO2 95%   Visual Acuity Right Eye Distance:   Left Eye Distance:   Bilateral Distance:    Right Eye Near:   Left Eye Near:    Bilateral Near:     Physical Exam Vitals and nursing note reviewed.  Constitutional:      Appearance: She is not ill-appearing or toxic-appearing.  HENT:     Head: Normocephalic and atraumatic.     Right Ear: Hearing, tympanic membrane, ear canal and external ear normal.     Left Ear: Hearing, tympanic membrane, ear canal and external ear normal.     Nose: Congestion present.     Right Sinus: Maxillary sinus tenderness present.     Left Sinus: Maxillary sinus tenderness present.     Mouth/Throat:     Lips: Pink.     Mouth: Mucous membranes are moist. No injury or oral lesions.     Dentition: Normal dentition.     Tongue: No lesions.     Pharynx: Oropharynx is clear. Uvula midline. Posterior oropharyngeal erythema present. No  pharyngeal swelling, oropharyngeal exudate, uvula swelling or postnasal drip.     Tonsils: No tonsillar exudate.  Eyes:     General: Lids are normal. Vision grossly intact. Gaze aligned appropriately.     Extraocular Movements: Extraocular movements intact.     Conjunctiva/sclera: Conjunctivae normal.  Neck:     Trachea: Trachea and phonation normal.  Cardiovascular:     Rate and Rhythm: Normal rate and regular rhythm.     Heart sounds: Normal heart sounds, S1 normal and S2 normal.  Pulmonary:     Effort: Pulmonary effort is normal. No tachypnea or respiratory distress.     Breath sounds: Normal breath sounds and air entry. No decreased breath sounds, wheezing, rhonchi or rales.     Comments: Speaks in full sentences without difficulty or increased respiratory effort. Normal breath sounds throughout.  Chest:     Chest wall: No tenderness.  Musculoskeletal:     Cervical back: Neck supple.     Right lower leg: No edema.     Left lower leg: No edema.  Lymphadenopathy:     Cervical: No cervical adenopathy.  Skin:    General: Skin is warm and dry.     Capillary Refill: Capillary refill takes less than 2 seconds.     Findings: No rash.  Neurological:     General: No focal deficit present.     Mental Status: She is alert and oriented to person, place, and time. Mental status is at baseline.     Cranial Nerves: No dysarthria or facial asymmetry.  Psychiatric:        Mood and Affect: Mood normal.        Speech: Speech normal.        Behavior: Behavior normal.        Thought Content: Thought content normal.        Judgment: Judgment normal.      UC Treatments / Results  Labs (all labs ordered are listed, but only abnormal results are displayed) Labs Reviewed - No data to display  EKG   Radiology No results found.  Procedures Procedures (including critical care time)  Medications Ordered in UC Medications - No data to display  Initial Impression / Assessment and Plan /  UC Course  I have reviewed the triage vital signs and the nursing notes.  Pertinent labs & imaging results that were available during my care of the patient were reviewed by me and considered in my medical decision making (see chart for details).   1. Acute sinusitis with symptoms > 10 days, acute cough Presentation is consistent with acute postviral bacterial sinusitis.   Symptoms have been present for greater than 10 days and have not responded well to over-the-counter therapies, therefore may have antibiotic.  Recent blood work (CMP May 2025) shows normal renal function and normal liver function. No dosage changes required for Augmentin.   Prescriptions for further symptomatic relief sent, may continue using OTC medications as needed.  Chest x-ray ordered to rule out focal consolidation/pneumonia given length of symptoms.  I will call if chest x-ray results indicate need for change in treatment plan.   Vital signs hemodynamically stable, she is well-appearing.    Counseled patient on potential for adverse effects with medications prescribed/recommended today, strict ER and return-to-clinic precautions discussed, patient verbalized understanding.    Final Clinical Impressions(s) / UC Diagnoses   Final diagnoses:  Acute cough  Acute sinusitis with symptoms > 10 days     Discharge Instructions      Please go to med [facility] and have x-ray performed. Do not check in to the ER. Go to imaging department, get images performed, then go home. You will receive a phone call if the x-ray shows any abnormal results requiring further treatment. If the x-ray results do not change our treatment plan, you will not receive a phone call.  Also see these results on MyChart.  Med [facility] [address] [place], [place]  Your evaluation shows you have a bacterial sinus infection. - Take antibiotic sent to pharmacy as directed to treat sinus infection. - Purchase  Mucinex over the counter and take this every 12 hours as needed for nasal congestion and cough. - Warm compresses to the cheeks and forehead as needed to help with sinus headaches as well as tylenol as needed.  If you develop any new or worsening symptoms or if your symptoms do not start to improve, please return here or follow-up with your primary care provider. If your symptoms are severe, please go to the emergency room.      ED Prescriptions     Medication Sig Dispense Auth. Provider   amoxicillin-clavulanate (AUGMENTIN) 875-125 MG tablet Take 1 tablet by mouth every 12 (twelve) hours. 14 tablet Carlisle Beers, FNP      PDMP not reviewed this encounter.   Carlisle Beers, [place] 09/14/23 1256

## 2023-09-14 NOTE — ED Triage Notes (Addendum)
 Pt c/o cough, sob when coughing, fatigue, and body aches for 2 weeks.  Pt has tremors since she was in 50s but they have been worse since being sick.   She has been taking cold and flu medication

## 2023-11-06 ENCOUNTER — Other Ambulatory Visit: Payer: Self-pay | Admitting: Family Medicine

## 2023-11-06 DIAGNOSIS — G250 Essential tremor: Secondary | ICD-10-CM

## 2023-11-16 ENCOUNTER — Other Ambulatory Visit: Payer: Self-pay

## 2023-11-16 DIAGNOSIS — Z170 Estrogen receptor positive status [ER+]: Secondary | ICD-10-CM

## 2023-11-19 ENCOUNTER — Encounter: Payer: Self-pay | Admitting: Nurse Practitioner

## 2023-11-19 ENCOUNTER — Inpatient Hospital Stay: Payer: Medicare Other | Attending: Nurse Practitioner

## 2023-11-19 ENCOUNTER — Inpatient Hospital Stay (HOSPITAL_BASED_OUTPATIENT_CLINIC_OR_DEPARTMENT_OTHER): Payer: Medicare Other | Admitting: Nurse Practitioner

## 2023-11-19 VITALS — BP 152/62 | HR 71 | Temp 97.0°F | Resp 14 | Wt 181.0 lb

## 2023-11-19 DIAGNOSIS — Z1732 Human epidermal growth factor receptor 2 negative status: Secondary | ICD-10-CM | POA: Insufficient documentation

## 2023-11-19 DIAGNOSIS — Z170 Estrogen receptor positive status [ER+]: Secondary | ICD-10-CM | POA: Diagnosis not present

## 2023-11-19 DIAGNOSIS — Z1721 Progesterone receptor positive status: Secondary | ICD-10-CM | POA: Diagnosis not present

## 2023-11-19 DIAGNOSIS — M8580 Other specified disorders of bone density and structure, unspecified site: Secondary | ICD-10-CM | POA: Diagnosis not present

## 2023-11-19 DIAGNOSIS — C50411 Malignant neoplasm of upper-outer quadrant of right female breast: Secondary | ICD-10-CM

## 2023-11-19 DIAGNOSIS — C50811 Malignant neoplasm of overlapping sites of right female breast: Secondary | ICD-10-CM | POA: Insufficient documentation

## 2023-11-19 LAB — CMP (CANCER CENTER ONLY)
ALT: 19 U/L (ref 0–44)
AST: 21 U/L (ref 15–41)
Albumin: 4.3 g/dL (ref 3.5–5.0)
Alkaline Phosphatase: 73 U/L (ref 38–126)
Anion gap: 5 (ref 5–15)
BUN: 19 mg/dL (ref 8–23)
CO2: 28 mmol/L (ref 22–32)
Calcium: 9.2 mg/dL (ref 8.9–10.3)
Chloride: 104 mmol/L (ref 98–111)
Creatinine: 1.15 mg/dL — ABNORMAL HIGH (ref 0.44–1.00)
GFR, Estimated: 46 mL/min — ABNORMAL LOW (ref >60)
Glucose, Bld: 92 mg/dL (ref 70–99)
Potassium: 5.1 mmol/L (ref 3.5–5.1)
Sodium: 137 mmol/L (ref 135–145)
Total Bilirubin: 0.5 mg/dL (ref 0.0–1.2)
Total Protein: 7.5 g/dL (ref 6.5–8.1)

## 2023-11-19 LAB — CBC WITH DIFFERENTIAL (CANCER CENTER ONLY)
Abs Immature Granulocytes: 0.02 10*3/uL (ref 0.00–0.07)
Basophils Absolute: 0 10*3/uL (ref 0.0–0.1)
Basophils Relative: 1 %
Eosinophils Absolute: 0.1 10*3/uL (ref 0.0–0.5)
Eosinophils Relative: 2 %
HCT: 39.6 % (ref 36.0–46.0)
Hemoglobin: 13.8 g/dL (ref 12.0–15.0)
Immature Granulocytes: 0 %
Lymphocytes Relative: 22 %
Lymphs Abs: 1.2 10*3/uL (ref 0.7–4.0)
MCH: 32.9 pg (ref 26.0–34.0)
MCHC: 34.8 g/dL (ref 30.0–36.0)
MCV: 94.5 fL (ref 80.0–100.0)
Monocytes Absolute: 0.5 10*3/uL (ref 0.1–1.0)
Monocytes Relative: 10 %
Neutro Abs: 3.5 10*3/uL (ref 1.7–7.7)
Neutrophils Relative %: 65 %
Platelet Count: 166 10*3/uL (ref 150–400)
RBC: 4.19 MIL/uL (ref 3.87–5.11)
RDW: 12.1 % (ref 11.5–15.5)
WBC Count: 5.3 10*3/uL (ref 4.0–10.5)
nRBC: 0 % (ref 0.0–0.2)

## 2023-11-19 NOTE — Progress Notes (Signed)
 [facility]     [phone] [phone]    Patient Care Team: Graig Lawyer, MD as PCP - General (Family Medicine) Rex Castor, MD as Referring Physician (Neurology) Eileen Grate, MD as Referring Physician (Ophthalmology) Sim Dryer, MD as Consulting Physician (General Surgery) Sonja Cullman, MD as Consulting Physician (Hematology) Retta Caster, MD as Consulting Physician (Radiation Oncology) Auther Bo, RN as Oncology Nurse Navigator Alane Hsu, RN as Oncology Nurse Navigator Bently Morath K, NP as Nurse Practitioner (Nurse Practitioner) Lylia Sand, MD as Consulting Physician (Physical Medicine and Rehabilitation) Orvan Blanch, MD as Consulting Physician (Orthopedic Surgery)   CHIEF COMPLAINT: Follow-up right breast cancer  Oncology History Overview Note  Cancer Staging Malignant neoplasm of upper-outer quadrant of right breast in female, estrogen receptor positive [facility]) Staging form: Breast, AJCC 8th Edition - Clinical stage from 03/23/2021: Stage IA (cT1c, cN0, cM0, G2, ER+, PR+, HER2-) - Signed by Sonja [place], MD on 03/30/2021 - Pathologic stage from 04/20/2021: Stage IA (pT1c, pN0, cM0, G2, ER+, PR+, HER2-) - Signed by Sonja [place], MD on 04/28/2021     Malignant neoplasm of upper-outer quadrant of right breast in female, estrogen receptor positive (HCC)  02/28/2021 Mammogram   Bilateral Diagnostic and Bilateral Breast Ultrasound  IMPRESSION: 1. 1.5 cm mass with imaging features highly suspicious for malignancy in the 12 o'clock position of the right breast. 2. 2 right axillary lymph nodes with borderline eccentric cortical thickening, suspicious for possible metastatic nodes. 3. No evidence of malignancy on the left.   03/23/2021 Cancer Staging   Staging form: Breast, AJCC 8th Edition - Clinical stage from 03/23/2021: Stage IA (cT1c, cN0, cM0, G2, ER+, PR+, HER2-) - Signed by Sonja Kiron, MD on  03/30/2021 Stage prefix: Initial diagnosis Histologic grading system: 3 grade system   03/23/2021 Pathology Results   Diagnosis 1. Breast, right, needle core biopsy, 12:00 3cm fn, ribbon clip - INVASIVE DUCTAL CARCINOMA. SEE NOTE - DUCTAL CARCINOMA IN SITU, LOW-GRADE 2. Lymph node, needle/core biopsy, right axilla, q clip - BENIGN LYMPH NODE Diagnosis Note 1. Carcinoma measures 1.2 cm in greatest linear dimension and appears grade 2.  Prognostic panel pending.   03/28/2021 Initial Diagnosis   Malignant neoplasm of upper-outer quadrant of right breast in female, estrogen receptor positive (HCC)   04/20/2021 Cancer Staging   Staging form: Breast, AJCC 8th Edition - Pathologic stage from 04/20/2021: Stage IA (pT1c, pN0, cM0, G2, ER+, PR+, HER2-) - Signed by Sonja [place], MD on 04/28/2021 Stage prefix: Initial diagnosis Multigene prognostic tests performed: None Histologic grading system: 3 grade system Residual tumor (R): R0 - None   04/20/2021 Pathology Results   FINAL MICROSCOPIC DIAGNOSIS:   A. BREAST, RIGHT, LUMPECTOMY:  -  Invasive ductal carcinoma, Nottingham grade 2 and of 3, 1.7 cm  -  Ductal carcinoma in-situ, low to intermediate grade  -  Margins uninvolved by carcinoma (see part B below)  -  Previous biopsy site changes present  -  See oncology table and comment below   B. BREAST, RIGHT ADDITIONAL ANTERIOR-LATERAL MARGIN, EXCISION:  -  No residual carcinoma identified   C. LYMPH NODE, RIGHT AXILLARY, SENTINEL, EXCISION:  -  No carcinoma identified in one lymph node (0/1)   D. LYMPH NODE, RIGHT AXILLARY, SENTINEL, EXCISION:  -  No carcinoma identified in one lymph node (0/1)       CURRENT THERAPY: Surveillance  INTERVAL HISTORY Ms. Stachowicz returns for follow-up as scheduled, last  seen by Dr. Maryalice Smaller 11/20/2022.  Doing well from a breast cancer standpoint, with no concerns in the right breast and stable pain in the left which has been present since diagnosis, denies new  lump/mass, nipple discharge or inversion, or skin change.  She thinks she had a left breast mammogram/ultrasound by PCP but not sure when.  Feels that her top lip is swollen, denies injury, pain, rash, or any other swelling.  Denies throat tightness or trouble swallowing.  She is under a lot of stress with family and the recent passing of her significant other.   ROS  Other systems reviewed and negative  Past Medical History:  Diagnosis Date   Abnormal results of liver function studies 05/15/2007   Allergy    Anal fissure 09/26/2007   Formatting of this note might be different from the original. Qualifier: Diagnosis of  By: Celestia Colander CMA (AAMA), Leisha   Anxiety    B12 deficiency 05/05/2020   Chicken pox    Essential tremor    of head, sometimes hands.  pt takes primidone  to help the tremors-DR. MILLER-NEUROLOGIST IN [place].  PT WAS STARTED ON VALUIM MANY YEARS AGO FOR HER TREMORS--AND CONTINUES TO TAKE.   Fracture of humerus, proximal, left, closed 10/28/2011   pt has finished physical therapy-limited ROM reaching to her back and unable to lift heavy things with left hand/arm   GERD (gastroesophageal reflux disease)    H/O hiatal hernia    Hereditary and idiopathic peripheral neuropathy 03/27/2014   Hx of colonic polyps    Osteoarthritis    PAIN AND OA BILATERAL KNEES; ALSO HAS ARTHRITIS IN HANDS AND BACK BUT NO BACK PAIN   Other chronic otitis externa 03/01/2009   Formatting of this note might be different from the original. Qualifier: Diagnosis of  By: Larrie Po MD, Wilmon Hashimoto     Past Surgical History:  Procedure Laterality Date   arthroscopy left knee     arthroscopy right knee     bmp  07/17/2000   BREAST BIOPSY Right 03/23/2021   x2   BREAST LUMPECTOMY Right 04/20/2021   BREAST LUMPECTOMY WITH RADIOACTIVE SEED AND SENTINEL LYMPH NODE BIOPSY Right 04/20/2021   Procedure: RIGHT BREAST LUMPECTOMY WITH RADIOACTIVE SEED AND SENTINEL LYMPH NODE BIOPSY;  Surgeon: Sim Dryer, MD;   Location: [facility];  Service: General;  Laterality: Right;   CATARACT EXTRACTION, BILATERAL     COLONOSCOPY W/ POLYPECTOMY     DILATION AND CURETTAGE OF UTERUS     RIGHT FOOT SURGERIES X 2     TOTAL KNEE ARTHROPLASTY  03/13/2012   Procedure: TOTAL KNEE ARTHROPLASTY;  Surgeon: Aurther Blue, MD;  Location: [facility];  Service: Orthopedics;  Laterality: Left;  steroid injection right knee   TOTAL KNEE ARTHROPLASTY Right 08/04/2013   Procedure: RIGHT TOTAL KNEE ARTHROPLASTY;  Surgeon: Aurther Blue, MD;  Location: [facility];  Service: Orthopedics;  Laterality: Right;     Outpatient Encounter Medications as of 11/19/2023  Medication Sig   cetirizine (ZYRTEC) 10 MG tablet Take by mouth.   diazepam  (VALIUM ) 5 MG tablet TAKE 1 TABLET(5 MG) BY MOUTH EVERY 8 HOURS AS NEEDED FOR ANXIETY   hyoscyamine  (LEVSIN) 0.125 MG tablet Take 1 tablet (0.125 mg total) by mouth every 4 (four) hours as needed for cramping.   Multiple Vitamin (MULTIVITAMIN) tablet Take 1 tablet by mouth daily.   omeprazole  (PRILOSEC) 20 MG capsule TAKE 1 CAPSULE(20 MG) BY MOUTH DAILY   primidone  (MYSOLINE ) 250 MG tablet  Take 1 tablet (250 mg total) by mouth 3 (three) times daily.   Vitamin D , Ergocalciferol , (DRISDOL ) 1.25 MG (50000 UNIT) CAPS capsule Take 1 capsule (50,000 Units total) by mouth every 7 (seven) days.   amoxicillin -clavulanate (AUGMENTIN ) 875-125 MG tablet Take 1 tablet by mouth every 12 (twelve) hours. (Patient not taking: Reported on 11/19/2023)   No facility-administered encounter medications on file as of 11/19/2023.     Today's Vitals   11/19/23 1124 11/19/23 1126  BP: (!) 152/62   Pulse: 71   Resp: 14   Temp: (!) 97 F (36.1 C)   TempSrc: Temporal   SpO2: 97%   Weight: 181 lb (82.1 kg)   PainSc:  0-No pain   Body mass index is 31.07 kg/m.   ECOG PERFORMANCE STATUS: 0 - Asymptomatic  PHYSICAL EXAM GENERAL:alert, no distress and comfortable SKIN: no rash  HEENT : sclera clear.  Neck  without mass.  No perioral edema LYMPH:  no palpable cervical or supraclavicular lymphadenopathy  LUNGS: clear with normal breathing effort HEART: regular rate & rhythm, no lower extremity edema ABDOMEN: abdomen soft, non-tender and normal bowel sounds NEURO: alert & oriented x 3 with fluent speech, no focal motor/sensory deficits Breast exam: No nipple discharge or inversion.  S/p right lumpectomy, incisions completely healed.  Left breast ttp in the upper central and outer breast. No palpable mass or nodularity in either breast or axilla that I could appreciate    CBC    Latest Ref Rng & Units 11/19/2023   11:03 AM 05/09/2023    1:53 PM 11/20/2022   11:51 AM  CBC  WBC 4.0 - 10.5 K/uL 5.3  5.2  5.0   Hemoglobin 12.0 - 15.0 g/dL 40.9  81.1  91.4   Hematocrit 36.0 - 46.0 % 39.6  41.7  38.8   Platelets 150 - 400 K/uL 166  184.0  164       CMP     Latest Ref Rng & Units 11/19/2023   11:03 AM 11/20/2022   11:51 AM 05/22/2022   11:46 AM  CMP  Glucose 70 - 99 mg/dL 92  87  85   BUN 8 - 23 mg/dL 19  17  14    Creatinine 0.44 - 1.00 mg/dL 7.82  9.56  2.13   Sodium 135 - 145 mmol/L 137  138  139   Potassium 3.5 - 5.1 mmol/L 5.1  4.9  4.6   Chloride 98 - 111 mmol/L 104  104  105   CO2 22 - 32 mmol/L 28  27  28    Calcium  8.9 - 10.3 mg/dL 9.2  9.2  9.2   Total Protein 6.5 - 8.1 g/dL 7.5  7.5  7.3   Total Bilirubin 0.0 - 1.2 mg/dL 0.5  0.4  0.5   Alkaline Phos 38 - 126 U/L 73  64  69   AST 15 - 41 U/L 21  19  19    ALT 0 - 44 U/L 19  17  14        ASSESSMENT & PLAN: KAYLAND YEUNG is a 89 y.o. post-menopausal female with    1. Malignant neoplasm of upper-outer quadrant of right breast, Stage IA, p(T1c, N0), ER+/PR+/HER2-, Grade 2 -Per pt, presented with left breast pain which was negative, diagnosed 03/2021 -S/p right lumpectomy on 04/20/21, radiation deferred due to her advanced age and stage I disease.  She declined tamoxifen  and is not a good candidate for AI -Currently on surveillance  alone -Ms. Lynder Sanger  is clinically doing well from a breast cancer standpoint. Exam is benign, labs are unremarkable except slightly elevated Scr. I encouraged her to hydrate.  -Records appear she may be overdue for a mammogram, she agrees to proceed if due -Continue surveillance and annual follow-up until she is 5 years from initial diagnosis   2. Osteopenia -DEXA 10/17/21 showed worsening osteopenia, to -2.1. -She declined tamoxifen   -she is taking vit D but ran out, I encouraged her to restart, and add calcium , and continue weightbearing exercise.     PLAN: -Labs reviewed -Continue surveillance -Mammogram if due -Annual f/up for total 5 years -Active listening/emotional support given   All questions were answered. The patient knows to call the clinic with any problems, questions or concerns. No barriers to learning were detected.   Kriti Katayama K Shir Bergman, NP 11/19/2023

## 2023-12-12 ENCOUNTER — Ambulatory Visit (INDEPENDENT_AMBULATORY_CARE_PROVIDER_SITE_OTHER): Payer: Medicare Other | Admitting: Family Medicine

## 2023-12-12 ENCOUNTER — Encounter: Payer: Self-pay | Admitting: Family Medicine

## 2023-12-12 VITALS — BP 128/70 | HR 66 | Temp 97.1°F | Ht 64.0 in | Wt 179.8 lb

## 2023-12-12 DIAGNOSIS — E66811 Obesity, class 1: Secondary | ICD-10-CM | POA: Diagnosis not present

## 2023-12-12 DIAGNOSIS — E559 Vitamin D deficiency, unspecified: Secondary | ICD-10-CM | POA: Diagnosis not present

## 2023-12-12 DIAGNOSIS — N644 Mastodynia: Secondary | ICD-10-CM

## 2023-12-12 DIAGNOSIS — Z853 Personal history of malignant neoplasm of breast: Secondary | ICD-10-CM | POA: Diagnosis not present

## 2023-12-12 DIAGNOSIS — G250 Essential tremor: Secondary | ICD-10-CM

## 2023-12-12 MED ORDER — PRIMIDONE 250 MG PO TABS
250.0000 mg | ORAL_TABLET | Freq: Three times a day (TID) | ORAL | 3 refills | Status: DC
Start: 2023-12-12 — End: 2024-04-14

## 2023-12-12 MED ORDER — VITAMIN D (ERGOCALCIFEROL) 1.25 MG (50000 UNIT) PO CAPS: 50000.0000 [IU] | ORAL_CAPSULE | ORAL | 0 refills | Status: DC

## 2023-12-12 NOTE — Assessment & Plan Note (Signed)
 Recent follow-up with oncology. Doing well. Continue to monitor.

## 2023-12-12 NOTE — Progress Notes (Signed)
 [facility] [address] [place] [address] Dept: [phone] Dept Fax: [phone]  Chronic Care Office Visit  Subjective:    Patient ID: [name], female    DOB: Mar 24, 1934, 89 y.o..   MRN: [id number]  Chief Complaint  Patient presents with   Follow-up    4 month f/u.   C/o having some dizziness and weight gain.     History of Present Illness:  Patient is in today for reassessment of chronic medical issues.  [name] has a history of essential tremor. She is managed on diazepam  5 mg TID and primidone  250 mg TID. Overall, she feels her tremor is manageable.   [name] has been having issues with left breast pain over the past year. Her mammogram and ultrasound are normal. She had a recent exam by her oncologist that was also normal. [name] describes this as an achy feeling. She has had no discharge from the nipple and denies skin changes to the breast. She does take an Aleve  220 mg daily.  [name] expresses concerns about her weight. Her feeling is that her weight is increasing. She lives along. She is finds it difficult to cook for herself, so eats scrambled eggs and bread as the main components of her diet. She gets occasional vegetables when she and her daughter go out.  Past Medical History: Patient Active Problem List   Diagnosis Date Noted   Class 1 obesity 12/12/2023   Left Chest Neuritis (T1-T2) 05/28/2023   Mastalgia 05/09/2023   Overactive bladder 02/13/2023   Chronic right-sided low back pain with right-sided sciatica 08/15/2022   Postherpetic neuralgia 04/05/2022   History of breast cancer, right 08/12/2021   Adrenal nodule- likely benign adenoma (HCC) 04/12/2021   Aortic atherosclerosis (HCC) 04/12/2021   Malignant neoplasm of upper-outer quadrant of right breast in female, estrogen receptor positive (HCC) 03/28/2021   Hyperlipidemia 03/18/2021   Invasive ductal carcinoma of  breast, female, right (HCC) 02/28/2021   Vitamin D  deficiency 08/05/2019   Allergic rhinitis 10/28/2018   OSA (obstructive sleep apnea) 11/28/2017   Senile nuclear sclerosis 12/10/2014   Gait difficulty 03/27/2014   Osteopenia 10/31/2012   Gastro-esophageal reflux disease without esophagitis 06/25/2012   Fecal incontinence 06/25/2012   Thoracic radiculitis 07/20/2010   Diverticulosis of colon 09/26/2007   Depression with anxiety 09/26/2007   Enlarged lymph nodes 07/26/2007   History of colonic polyps 05/15/2007   Irritable bowel syndrome 05/15/2007   Tremor, essential 02/11/2007   Osteoarthritis of both hands 02/11/2007   Past Surgical History:  Procedure Laterality Date   arthroscopy left knee     arthroscopy right knee     bmp  07/17/2000   BREAST BIOPSY Right 03/23/2021   x2   BREAST LUMPECTOMY Right 04/20/2021   BREAST LUMPECTOMY WITH RADIOACTIVE SEED AND SENTINEL LYMPH NODE BIOPSY Right 04/20/2021   Procedure: RIGHT BREAST LUMPECTOMY WITH RADIOACTIVE SEED AND SENTINEL LYMPH NODE BIOPSY;  Surgeon: Sim Dryer, MD;  Location: [facility];  Service: General;  Laterality: Right;   CATARACT EXTRACTION, BILATERAL     COLONOSCOPY W/ POLYPECTOMY     DILATION AND CURETTAGE OF UTERUS     RIGHT FOOT SURGERIES X 2     TOTAL KNEE ARTHROPLASTY  03/13/2012   Procedure: TOTAL KNEE ARTHROPLASTY;  Surgeon: Aurther Blue, MD;  Location: [facility];  Service: Orthopedics;  Laterality: Left;  steroid injection right knee   TOTAL KNEE ARTHROPLASTY Right 08/04/2013  Procedure: RIGHT TOTAL KNEE ARTHROPLASTY;  Surgeon: Aurther Blue, MD;  Location: [facility];  Service: Orthopedics;  Laterality: Right;   Family History  Problem Relation Age of Onset   Stroke Mother    Kidney disease Father    Cancer Maternal Grandmother        Leukemia   Kidney failure Other        renal disease   Outpatient Medications Prior to Visit  Medication Sig Dispense Refill   cetirizine (ZYRTEC) 10  MG tablet Take by mouth.     diazepam  (VALIUM ) 5 MG tablet TAKE 1 TABLET(5 MG) BY MOUTH EVERY 8 HOURS AS NEEDED FOR ANXIETY 90 tablet 2   hyoscyamine  (LEVSIN) 0.125 MG tablet Take 1 tablet (0.125 mg total) by mouth every 4 (four) hours as needed for cramping. 60 tablet 1   Multiple Vitamin (MULTIVITAMIN) tablet Take 1 tablet by mouth daily.     omeprazole  (PRILOSEC) 20 MG capsule TAKE 1 CAPSULE(20 MG) BY MOUTH DAILY 90 capsule 3   primidone  (MYSOLINE ) 250 MG tablet Take 1 tablet (250 mg total) by mouth 3 (three) times daily. 270 each 3   Vitamin D , Ergocalciferol , (DRISDOL ) 1.25 MG (50000 UNIT) CAPS capsule Take 1 capsule (50,000 Units total) by mouth every 7 (seven) days. 12 capsule 0   No facility-administered medications prior to visit.   Allergies  Allergen Reactions   Memantine Swelling   Propranolol  Other (See Comments)    Made her head feel very funny Made her head feel very funny    Codeine Nausea And Vomiting   Duloxetine      Tremor    Gabapentin  (Once-Daily)     Made her feel funny.   Lyrica  [Pregabalin ]     Made her feel funny   Sulfamethoxazole Rash    REACTION: unspecified   Objective:   Today's Vitals   12/12/23 1258  BP: 128/70  Pulse: 66  Temp: (!) 97.1 F (36.2 C)  TempSrc: Temporal  SpO2: 96%  Weight: 179 lb 12.8 oz (81.6 kg)  Height: 5\' 4"  (1.626 m)   Body mass index is 30.86 kg/m.   General: Well developed, well nourished. No acute distress. Psych: Alert and oriented. Normal mood and affect.  Health Maintenance Due  Topic Date Due   Zoster Vaccines- Shingrix (1 of 2) Never done   DTaP/Tdap/Td (2 - Tdap) 07/17/2002   Medicare Annual Wellness (AWV)  07/19/2022   Assessment & Plan:   Problem List Items Addressed This Visit       Nervous and Auditory   Tremor, essential - Primary   Stable. Continue diazepam  5 mg TID and primidone  250 mg TID.      Relevant Medications   primidone  (MYSOLINE ) 250 MG tablet     Other   Class 1 obesity    Discussed dietary approaches. I am concerned that the lack of variety in her diet could lead to nutritional deficiencies. We discussed approaches to increasing vegetables in her diet, including roasting vegetables which only call for limited preparation and cooking. I also encouraged her to look into purchasing a set of hand weights to be able to do some chair exercises for muscle strengthening.      History of breast cancer, right   Recent follow-up with oncology. Doing well. Continue to monitor.      Mastalgia   Work up has not shown an obvious cause for mastalgia. I recommend she try using ice packs on the breast when the achiness is increased. She  can continue to take Aleve  220 mg daily. She could also try adding Tylenol .      Vitamin D  deficiency   Continue Vitamin D  50,000 units weekly.      Relevant Medications   Vitamin D , Ergocalciferol , (DRISDOL ) 1.25 MG (50000 UNIT) CAPS capsule    Return in about 4 months (around 04/13/2024) for Reassessment.   Graig Lawyer, MD

## 2023-12-12 NOTE — Assessment & Plan Note (Signed)
Stable. Continue diazepam 5 mg TID and primidone 250 mg TID

## 2023-12-12 NOTE — Assessment & Plan Note (Signed)
-  Continue Vitamin D 50,000 units weekly

## 2023-12-12 NOTE — Assessment & Plan Note (Signed)
 Work up has not shown an obvious cause for mastalgia. I recommend she try using ice packs on the breast when the achiness is increased. She can continue to take Aleve  220 mg daily. She could also try adding Tylenol .

## 2023-12-12 NOTE — Assessment & Plan Note (Signed)
 Discussed dietary approaches. I am concerned that the lack of variety in her diet could lead to nutritional deficiencies. We discussed approaches to increasing vegetables in her diet, including roasting vegetables which only call for limited preparation and cooking. I also encouraged her to look into purchasing a set of hand weights to be able to do some chair exercises for muscle strengthening.

## 2023-12-24 ENCOUNTER — Ambulatory Visit: Payer: Medicare Other

## 2023-12-31 ENCOUNTER — Ambulatory Visit: Attending: Surgery

## 2023-12-31 VITALS — Wt 182.5 lb

## 2023-12-31 DIAGNOSIS — Z483 Aftercare following surgery for neoplasm: Secondary | ICD-10-CM | POA: Insufficient documentation

## 2023-12-31 NOTE — Therapy (Signed)
 OUTPATIENT PHYSICAL THERAPY SOZO SCREENING NOTE   Patient Name: [name] MRN: [id number] DOB:February 25, 1934, 89 y.o., female Today's Date: 12/31/2023  PCP: Graig Lawyer, MD REFERRING PROVIDER: Sim Dryer, MD   PT End of Session - 12/31/23 1508     Visit Number 1   # unchanged due to screen only   PT Start Time 1505    PT Stop Time 1509    PT Time Calculation (min) 4 min    Activity Tolerance Patient tolerated treatment well    Behavior During Therapy [facility] for tasks assessed/performed           Past Medical History:  Diagnosis Date   Abnormal results of liver function studies 05/15/2007   Allergy    Anal fissure 09/26/2007   Formatting of this note might be different from the original. Qualifier: Diagnosis of  By: Celestia Colander CMA (AAMA), Leisha   Anxiety    B12 deficiency 05/05/2020   Chicken pox    Essential tremor    of head, sometimes hands.  pt takes primidone  to help the tremors-DR. MILLER-NEUROLOGIST IN [place].  PT WAS STARTED ON VALUIM MANY YEARS AGO FOR HER TREMORS--AND CONTINUES TO TAKE.   Fracture of humerus, proximal, left, closed 10/28/2011   pt has finished physical therapy-limited ROM reaching to her back and unable to lift heavy things with left hand/arm   GERD (gastroesophageal reflux disease)    H/O hiatal hernia    Hereditary and idiopathic peripheral neuropathy 03/27/2014   Hx of colonic polyps    Osteoarthritis    PAIN AND OA BILATERAL KNEES; ALSO HAS ARTHRITIS IN HANDS AND BACK BUT NO BACK PAIN   Other chronic otitis externa 03/01/2009   Formatting of this note might be different from the original. Qualifier: Diagnosis of  By: Larrie Po MD, Wilmon Hashimoto   Past Surgical History:  Procedure Laterality Date   arthroscopy left knee     arthroscopy right knee     bmp  07/17/2000   BREAST BIOPSY Right 03/23/2021   x2   BREAST LUMPECTOMY Right 04/20/2021   BREAST LUMPECTOMY WITH RADIOACTIVE SEED AND SENTINEL LYMPH NODE BIOPSY Right 04/20/2021    Procedure: RIGHT BREAST LUMPECTOMY WITH RADIOACTIVE SEED AND SENTINEL LYMPH NODE BIOPSY;  Surgeon: Sim Dryer, MD;  Location: [facility];  Service: General;  Laterality: Right;   CATARACT EXTRACTION, BILATERAL     COLONOSCOPY W/ POLYPECTOMY     DILATION AND CURETTAGE OF UTERUS     RIGHT FOOT SURGERIES X 2     TOTAL KNEE ARTHROPLASTY  03/13/2012   Procedure: TOTAL KNEE ARTHROPLASTY;  Surgeon: Aurther Blue, MD;  Location: [facility];  Service: Orthopedics;  Laterality: Left;  steroid injection right knee   TOTAL KNEE ARTHROPLASTY Right 08/04/2013   Procedure: RIGHT TOTAL KNEE ARTHROPLASTY;  Surgeon: Aurther Blue, MD;  Location: [facility];  Service: Orthopedics;  Laterality: Right;   Patient Active Problem List   Diagnosis Date Noted   Class 1 obesity 12/12/2023   Left Chest Neuritis (T1-T2) 05/28/2023   Mastalgia 05/09/2023   Overactive bladder 02/13/2023   Chronic right-sided low back pain with right-sided sciatica 08/15/2022   Postherpetic neuralgia 04/05/2022   History of breast cancer, right 08/12/2021   Adrenal nodule- likely benign adenoma (HCC) 04/12/2021   Aortic atherosclerosis (HCC) 04/12/2021   Malignant neoplasm of upper-outer quadrant of right breast in female, estrogen receptor positive (HCC) 03/28/2021   Hyperlipidemia 03/18/2021   Invasive ductal carcinoma of breast, female, right (  HCC) 02/28/2021   Vitamin D  deficiency 08/05/2019   Allergic rhinitis 10/28/2018   OSA (obstructive sleep apnea) 11/28/2017   Senile nuclear sclerosis 12/10/2014   Gait difficulty 03/27/2014   Osteopenia 10/31/2012   Gastro-esophageal reflux disease without esophagitis 06/25/2012   Fecal incontinence 06/25/2012   Thoracic radiculitis 07/20/2010   Diverticulosis of colon 09/26/2007   Depression with anxiety 09/26/2007   Enlarged lymph nodes 07/26/2007   History of colonic polyps 05/15/2007   Irritable bowel syndrome 05/15/2007   Tremor, essential 02/11/2007    Osteoarthritis of both hands 02/11/2007    REFERRING DIAG: right breast cancer at risk for lymphedema  THERAPY DIAG: Aftercare following surgery for neoplasm  PERTINENT HISTORY: Patient was diagnosed on 02/28/2021 with right grade II invasive ductal carcinoma breast cancer. She underwent a right lumpectomy and sentinel node biopsy (5 negative nodes) on 04/20/2021. It is ER/PR positive and HER2 negative with a Ki67 of 1%. She had bilateral knee replacements 10 years ago.   PRECAUTIONS: right UE Lymphedema risk, None  SUBJECTIVE: Pt returns for her 6 month L-Dex screen. I want to start coming annually because my numbers have always been so good.  PAIN:  Are you having pain? No  SOZO SCREENING: Patient was assessed today using the SOZO machine to determine the lymphedema index score. This was compared to her baseline score. It was determined that she is within the recommended range when compared to her baseline and no further action is needed at this time. She will continue SOZO screenings. These are done every 3 months for 2 years post operatively followed by every 6 months for 2 years, and then annually.     L-DEX FLOWSHEETS - 12/31/23 1500       L-DEX LYMPHEDEMA SCREENING   Measurement Type Unilateral    L-DEX MEASUREMENT EXTREMITY Upper Extremity    POSITION  Standing    DOMINANT SIDE Right    At Risk Side Right    BASELINE SCORE (UNILATERAL) 3.9    L-DEX SCORE (UNILATERAL) -1.7    VALUE CHANGE (UNILAT) -5.6           P: Pt would like to begin annual SOZO screens now.    Denyce Flank, PTA 12/31/2023, 3:09 PM

## 2024-01-07 ENCOUNTER — Ambulatory Visit: Payer: Self-pay

## 2024-01-30 ENCOUNTER — Encounter: Payer: Self-pay | Admitting: Family Medicine

## 2024-02-26 ENCOUNTER — Other Ambulatory Visit: Payer: Self-pay | Admitting: Family Medicine

## 2024-02-26 DIAGNOSIS — G250 Essential tremor: Secondary | ICD-10-CM

## 2024-03-10 ENCOUNTER — Other Ambulatory Visit: Payer: Self-pay | Admitting: Family Medicine

## 2024-03-10 DIAGNOSIS — E559 Vitamin D deficiency, unspecified: Secondary | ICD-10-CM

## 2024-04-14 ENCOUNTER — Encounter: Payer: Self-pay | Admitting: Family Medicine

## 2024-04-14 ENCOUNTER — Ambulatory Visit (INDEPENDENT_AMBULATORY_CARE_PROVIDER_SITE_OTHER): Admitting: Family Medicine

## 2024-04-14 VITALS — BP 160/72 | HR 63 | Temp 97.2°F | Resp 18 | Wt 185.4 lb

## 2024-04-14 DIAGNOSIS — R030 Elevated blood-pressure reading, without diagnosis of hypertension: Secondary | ICD-10-CM | POA: Diagnosis not present

## 2024-04-14 DIAGNOSIS — I444 Left anterior fascicular block: Secondary | ICD-10-CM | POA: Insufficient documentation

## 2024-04-14 DIAGNOSIS — G4733 Obstructive sleep apnea (adult) (pediatric): Secondary | ICD-10-CM

## 2024-04-14 DIAGNOSIS — R609 Edema, unspecified: Secondary | ICD-10-CM

## 2024-04-14 DIAGNOSIS — G250 Essential tremor: Secondary | ICD-10-CM

## 2024-04-14 DIAGNOSIS — R0600 Dyspnea, unspecified: Secondary | ICD-10-CM

## 2024-04-14 LAB — COMPREHENSIVE METABOLIC PANEL WITH GFR
ALT: 18 U/L (ref 0–35)
AST: 20 U/L (ref 0–37)
Albumin: 4.1 g/dL (ref 3.5–5.2)
Alkaline Phosphatase: 63 U/L (ref 39–117)
BUN: 13 mg/dL (ref 6–23)
CO2: 30 meq/L (ref 19–32)
Calcium: 9.5 mg/dL (ref 8.4–10.5)
Chloride: 104 meq/L (ref 96–112)
Creatinine, Ser: 0.87 mg/dL (ref 0.40–1.20)
GFR: 59.55 mL/min — ABNORMAL LOW (ref >60.00)
Glucose, Bld: 87 mg/dL (ref 70–99)
Potassium: 4.5 meq/L (ref 3.5–5.1)
Sodium: 139 meq/L (ref 135–145)
Total Bilirubin: 0.4 mg/dL (ref 0.2–1.2)
Total Protein: 7.3 g/dL (ref 6.0–8.3)

## 2024-04-14 LAB — CBC
HCT: 39.1 % (ref 36.0–46.0)
Hemoglobin: 13.2 g/dL (ref 12.0–15.0)
MCHC: 33.7 g/dL (ref 30.0–36.0)
MCV: 96.4 fl (ref 78.0–100.0)
Platelets: 170 K/uL (ref 150.0–400.0)
RBC: 4.06 Mil/uL (ref 3.87–5.11)
RDW: 13 % (ref 11.5–15.5)
WBC: 4.3 K/uL (ref 4.0–10.5)

## 2024-04-14 LAB — BRAIN NATRIURETIC PEPTIDE: Pro B Natriuretic peptide (BNP): 73 pg/mL (ref 0.0–100.0)

## 2024-04-14 MED ORDER — LISINOPRIL 5 MG PO TABS: 5.0000 mg | ORAL_TABLET | Freq: Every day | ORAL | 3 refills | Status: AC

## 2024-04-14 NOTE — Progress Notes (Signed)
 [facility] [address] [place] [address] Dept: [phone] Dept Fax: [phone]  Chronic Care Office Visit  Subjective:    Patient ID: [name], female    DOB: [birth year], 89 y.o..   MRN: [id number]  Chief Complaint  Patient presents with   Medical Management of Chronic Issues    4 month follow up.  HM due- vaccinations.    Headache    Pt c/o of severe migraines for 3 weeks with dizziness. Pt used OTC aleve .     Shortness of Breath    Pt c/o of difficulty breathing for a few months    History of Present Illness:  Patient is in today for reassessment of chronic medical issues.  [name] has a history of essential tremor. She is managed on diazepam  5 mg TID. She notes that her tremor appears to be worsening with time. Her previous neurologist has retired from [employer].  [name] notes that she has been experiencing daily headaches over the past 3 weeks. She describes this as a sensation as if her scalp is swollen. She has been using naproxen  220 mg once a day, but does not really feel this is effective. During this same time, she has been experiencing swelling in other parts of her body, including her ankles. She notes shortness of breath, that can be with activity or at rest. She finds herself gasping periodically.   [name] has a history of OSA. She was previously prescribed a CPAP machine, but found she did not tolerate wearing this, so sent it back. However, she notes she will sleep frequently. She finds if she sits somewhere during the day, she tends to nod off.   Past Medical History: Patient Active Problem List   Diagnosis Date Noted   Class 1 obesity 12/12/2023   Left Chest Neuritis (T1-T2) 05/28/2023   Mastalgia 05/09/2023   Overactive bladder 02/13/2023   Chronic right-sided low back pain with right-sided sciatica 08/15/2022   Postherpetic neuralgia 04/05/2022   History of breast  cancer, right 08/12/2021   Adrenal nodule- likely benign adenoma (HCC) 04/12/2021   Aortic atherosclerosis 04/12/2021   Malignant neoplasm of upper-outer quadrant of right breast in female, estrogen receptor positive (HCC) 03/28/2021   Hyperlipidemia 03/18/2021   Invasive ductal carcinoma of breast, female, right (HCC) 02/28/2021   Vitamin D  deficiency 08/05/2019   Allergic rhinitis 10/28/2018   OSA (obstructive sleep apnea) 11/28/2017   Senile nuclear sclerosis 12/10/2014   Gait difficulty 03/27/2014   Osteopenia 10/31/2012   Gastro-esophageal reflux disease without esophagitis 06/25/2012   Fecal incontinence 06/25/2012   Thoracic radiculitis 07/20/2010   Diverticulosis of colon 09/26/2007   Depression with anxiety 09/26/2007   Enlarged lymph nodes 07/26/2007   History of colonic polyps 05/15/2007   Irritable bowel syndrome 05/15/2007   Tremor, essential 02/11/2007   Osteoarthritis of both hands 02/11/2007   Past Surgical History:  Procedure Laterality Date   arthroscopy left knee     arthroscopy right knee     bmp  07/17/2000   BREAST BIOPSY Right 03/23/2021   x2   BREAST LUMPECTOMY Right 04/20/2021   BREAST LUMPECTOMY WITH RADIOACTIVE SEED AND SENTINEL LYMPH NODE BIOPSY Right 04/20/2021   Procedure: RIGHT BREAST LUMPECTOMY WITH RADIOACTIVE SEED AND SENTINEL LYMPH NODE BIOPSY;  Surgeon: Vanderbilt Ned, MD;  Location: [facility];  Service: General;  Laterality: Right;   CATARACT EXTRACTION, BILATERAL     COLONOSCOPY W/ POLYPECTOMY  DILATION AND CURETTAGE OF UTERUS     RIGHT FOOT SURGERIES X 2     TOTAL KNEE ARTHROPLASTY  03/13/2012   Procedure: TOTAL KNEE ARTHROPLASTY;  Surgeon: Dempsey LULLA Moan, MD;  Location: [facility];  Service: Orthopedics;  Laterality: Left;  steroid injection right knee   TOTAL KNEE ARTHROPLASTY Right 08/04/2013   Procedure: RIGHT TOTAL KNEE ARTHROPLASTY;  Surgeon: Dempsey LULLA Moan, MD;  Location: [facility];  Service: Orthopedics;   Laterality: Right;   Family History  Problem Relation Age of Onset   Stroke Mother    Kidney disease Father    Cancer Maternal Grandmother        Leukemia   Kidney failure Other        renal disease   Outpatient Medications Prior to Visit  Medication Sig Dispense Refill   cetirizine (ZYRTEC) 10 MG tablet Take by mouth.     diazepam  (VALIUM ) 5 MG tablet TAKE 1 TABLET(5 MG) BY MOUTH EVERY 8 HOURS AS NEEDED FOR ANXIETY 90 tablet 2   hyoscyamine  (LEVSIN ) 0.125 MG tablet Take 1 tablet (0.125 mg total) by mouth every 4 (four) hours as needed for cramping. 60 tablet 1   Multiple Vitamin (MULTIVITAMIN) tablet Take 1 tablet by mouth daily.     omeprazole  (PRILOSEC) 20 MG capsule TAKE 1 CAPSULE(20 MG) BY MOUTH DAILY 90 capsule 3   Vitamin D , Ergocalciferol , (DRISDOL ) 1.25 MG (50000 UNIT) CAPS capsule TAKE 1 CAPSULE BY MOUTH EVERY 7 DAYS 12 capsule 3   primidone  (MYSOLINE ) 250 MG tablet Take 1 tablet (250 mg total) by mouth 3 (three) times daily. (Patient not taking: Reported on 04/14/2024) 270 tablet 3   No facility-administered medications prior to visit.   Allergies  Allergen Reactions   Memantine Swelling   Propranolol  Other (See Comments)    Made her head feel very funny Made her head feel very funny    Codeine Nausea And Vomiting   Duloxetine      Tremor    Gabapentin  (Once-Daily)     Made her feel funny.   Lyrica  [Pregabalin ]     Made her feel funny   Sulfamethoxazole Rash    REACTION: unspecified   Objective:   Today's Vitals   04/14/24 1329 04/14/24 1333  BP: (!) 162/74 (!) 174/77  Pulse: 63   Resp: 18   Temp: (!) 97.2 F (36.2 C)   TempSrc: Temporal   SpO2: 99%   Weight: 185 lb 6.4 oz (84.1 kg)   PainSc: 4    PainLoc: Head    Body mass index is 31.82 kg/m.   General: Well developed, well nourished. No acute distress. HEENT: Normocephalic, non-traumatic. PERRL, EOMI.  Lungs: Clear to auscultation bilaterally. No wheezing, rales or rhonchi. CV: RRR with a  I-II/VI systolic murmur. Pulses 2+ bilaterally. Extremities: 1-2+ edema of both lower legs. Neuro: Moderate intention tremor of the hands, regular rhythmic tremor of the head/neck. Psych: Alert and oriented. Normal mood and affect.  Health Maintenance Due  Topic Date Due   Zoster Vaccines- Shingrix (1 of 2) Never done   DTaP/Tdap/Td (2 - Tdap) 07/17/2002   COVID-19 Vaccine (3 - Pfizer risk series) 11/24/2019   Medicare Annual Wellness (AWV)  07/19/2022   Influenza Vaccine  02/15/2024     EKG: Normal sinus rhythm (rate= 66). LAFB. Poor R-wave progression.  Assessment & Plan:   Problem List Items Addressed This Visit       Respiratory   OSA (obstructive sleep apnea)   I will refer Ms.  Roma to neurology, including a reassessment of her OSA and to discuss current CPAP options.      Relevant Orders   Ambulatory referral to Neurology     Nervous and Auditory   Tremor, essential   I will refer [name] to neurology to reassess her tremor and to look at other therapies that may control this better.      Relevant Orders   Ambulatory referral to Neurology   Other Visit Diagnoses       Elevated blood pressure reading    -  Primary   BP is quite high today. I will check labs. Plan to start a low dose of lisinopril to bring this down.   Relevant Medications   lisinopril (ZESTRIL) 5 MG tablet   Other Relevant Orders   CBC   Comprehensive metabolic panel with GFR   TSH   Ambulatory referral to Cardiology   EKG 12-Lead (Completed)     Dyspnea, unspecified type       Etiology unclear. Appears possibly cardiac in light of lower leg edema. I will refer to cardiology.   Relevant Orders   CBC   Comprehensive metabolic panel with GFR   TSH   Brain natriuretic peptide   Ambulatory referral to Cardiology     Edema, unspecified type       As above, possibel cardiac cause. I will refer to cardiology. Consider furosemide if not improving.   Relevant Orders   Brain natriuretic  peptide   Ambulatory referral to Cardiology       Return for Follow-up after testing/imaging.   Garnette CHRISTELLA Simpler, MD

## 2024-04-14 NOTE — Assessment & Plan Note (Signed)
 I will refer [name] to neurology to reassess her tremor and to look at other therapies that may control this better.

## 2024-04-14 NOTE — Assessment & Plan Note (Signed)
 I will refer [name] to neurology, including a reassessment of her OSA and to discuss current CPAP options.

## 2024-04-15 ENCOUNTER — Ambulatory Visit: Payer: Self-pay | Admitting: Family Medicine

## 2024-04-15 LAB — TSH: TSH: 2.4 u[IU]/mL (ref 0.35–5.50)

## 2024-05-28 ENCOUNTER — Other Ambulatory Visit: Payer: Self-pay | Admitting: Family Medicine

## 2024-05-28 DIAGNOSIS — G250 Essential tremor: Secondary | ICD-10-CM

## 2024-05-28 NOTE — Telephone Encounter (Signed)
 Refill for Diazepam  5 mg LR  02/26/24, #90, 2 rf LOV  04/14/24 FOV  none scheduled.   Please review and advise.  Thanks. Dm/cma

## 2024-11-18 ENCOUNTER — Other Ambulatory Visit

## 2024-11-18 ENCOUNTER — Ambulatory Visit: Admitting: Nurse Practitioner

## 2025-01-05 ENCOUNTER — Ambulatory Visit
